# Patient Record
Sex: Female | Born: 1985 | Race: White | Hispanic: No | Smoking: Former smoker
Health system: Southern US, Community
[De-identification: ages and names within clinical notes are randomized; demographics above are authoritative.]

## PROBLEM LIST (undated history)

## (undated) DIAGNOSIS — T7840XA Allergy, unspecified, initial encounter: Secondary | ICD-10-CM

## (undated) DIAGNOSIS — G4730 Sleep apnea, unspecified: Secondary | ICD-10-CM

## (undated) DIAGNOSIS — F32A Depression, unspecified: Secondary | ICD-10-CM

## (undated) DIAGNOSIS — K219 Gastro-esophageal reflux disease without esophagitis: Secondary | ICD-10-CM

## (undated) DIAGNOSIS — J45909 Unspecified asthma, uncomplicated: Secondary | ICD-10-CM

## (undated) DIAGNOSIS — F909 Attention-deficit hyperactivity disorder, unspecified type: Secondary | ICD-10-CM

## (undated) DIAGNOSIS — T8859XA Other complications of anesthesia, initial encounter: Secondary | ICD-10-CM

## (undated) DIAGNOSIS — F419 Anxiety disorder, unspecified: Secondary | ICD-10-CM

## (undated) DIAGNOSIS — R0789 Other chest pain: Secondary | ICD-10-CM

## (undated) DIAGNOSIS — M1990 Unspecified osteoarthritis, unspecified site: Secondary | ICD-10-CM

## (undated) DIAGNOSIS — R011 Cardiac murmur, unspecified: Secondary | ICD-10-CM

## (undated) DIAGNOSIS — R0681 Apnea, not elsewhere classified: Secondary | ICD-10-CM

## (undated) HISTORY — DX: Cardiac murmur, unspecified: R01.1

## (undated) HISTORY — PX: TONSILLECTOMY: SUR1361

## (undated) HISTORY — DX: Unspecified osteoarthritis, unspecified site: M19.90

## (undated) HISTORY — DX: Anxiety disorder, unspecified: F41.9

## (undated) HISTORY — PX: CHOLECYSTECTOMY: SHX55

## (undated) HISTORY — PX: CERVICAL CONE BIOPSY: SUR198

## (undated) HISTORY — DX: Depression, unspecified: F32.A

## (undated) HISTORY — PX: WISDOM TOOTH EXTRACTION: SHX21

## (undated) HISTORY — DX: Sleep apnea, unspecified: G47.30

## (undated) HISTORY — DX: Unspecified asthma, uncomplicated: J45.909

## (undated) HISTORY — DX: Allergy, unspecified, initial encounter: T78.40XA

## (undated) HISTORY — PX: TUBAL LIGATION: SHX77

## (undated) HISTORY — PX: ENDOMETRIAL BIOPSY: SHX622

---

## 2020-01-21 DIAGNOSIS — Z419 Encounter for procedure for purposes other than remedying health state, unspecified: Secondary | ICD-10-CM | POA: Diagnosis not present

## 2020-02-21 ENCOUNTER — Other Ambulatory Visit: Payer: Self-pay

## 2020-02-21 ENCOUNTER — Ambulatory Visit
Admission: EM | Admit: 2020-02-21 | Discharge: 2020-02-21 | Disposition: A | Discharge status: Home / Self Care | Payer: Medicaid Other | Attending: Family Medicine | Admitting: Family Medicine

## 2020-02-21 ENCOUNTER — Encounter: Payer: Self-pay | Admitting: Emergency Medicine

## 2020-02-21 DIAGNOSIS — Z1152 Encounter for screening for COVID-19: Secondary | ICD-10-CM | POA: Diagnosis not present

## 2020-02-21 DIAGNOSIS — J029 Acute pharyngitis, unspecified: Secondary | ICD-10-CM | POA: Insufficient documentation

## 2020-02-21 DIAGNOSIS — B349 Viral infection, unspecified: Secondary | ICD-10-CM | POA: Diagnosis not present

## 2020-02-21 DIAGNOSIS — R591 Generalized enlarged lymph nodes: Secondary | ICD-10-CM | POA: Diagnosis not present

## 2020-02-21 DIAGNOSIS — Z419 Encounter for procedure for purposes other than remedying health state, unspecified: Secondary | ICD-10-CM | POA: Diagnosis not present

## 2020-02-21 HISTORY — DX: Apnea, not elsewhere classified: R06.81

## 2020-02-21 NOTE — Discharge Instructions (Signed)
Your COVID test is pending.  You should self quarantine until the test result is back.    Take Tylenol as needed for fever or discomfort.  Rest and keep yourself hydrated.    Go to the emergency department if you develop acute worsening symptoms.     

## 2020-02-21 NOTE — ED Provider Notes (Signed)
[facility]   [id number] 02/21/20 Arrival Time: 0850   CC: COVID symptoms  SUBJECTIVE: History from: patient.  Deborah Andrews is a 34 y.o. female who presents with abrupt onset of sore throat and lymphadenopathy since yesterday. Denies sick exposure to COVID, flu or strep. Denies recent travel. Has not taken OTC medications for this. There are no aggravating or alleviating factors. Denies previous symptoms in the past. Denies fever, chills, fatigue, sinus pain, rhinorrhea, SOB, wheezing, chest pain, nausea, changes in bowel or bladder habits.    ROS: As per HPI.  All other pertinent ROS negative.     Past Medical History:  Diagnosis Date  . Apnea    Past Surgical History:  Procedure Laterality Date  . CERVICAL CONE BIOPSY    . CESAREAN SECTION    . CHOLECYSTECTOMY    . TONSILLECTOMY    . TUBAL LIGATION    . WISDOM TOOTH EXTRACTION     No Known Allergies No current facility-administered medications on file prior to encounter.   No current outpatient medications on file prior to encounter.   Social History   Socioeconomic History  . Marital status: Divorced    Spouse name: Not on file  . Number of children: Not on file  . Years of education: Not on file  . Highest education level: Not on file  Occupational History  . Not on file  Tobacco Use  . Smoking status: Current Every Day Smoker    Packs/day: 1.00    Types: Cigarettes  . Smokeless tobacco: Never Used  Substance and Sexual Activity  . Alcohol use: Never  . Drug use: Never  . Sexual activity: Not on file  Other Topics Concern  . Not on file  Social History Narrative  . Not on file   Social Determinants of Health   Financial Resource Strain:   . Difficulty of Paying Living Expenses:   Food Insecurity:   . Worried About Programme researcher, broadcasting/film/video in the Last Year:   . Barista in the Last Year:   Transportation Needs:   . Freight forwarder (Medical):   Marland Kitchen Lack of Transportation  (Non-Medical):   Physical Activity:   . Days of Exercise per Week:   . Minutes of Exercise per Session:   Stress:   . Feeling of Stress :   Social Connections:   . Frequency of Communication with Friends and Family:   . Frequency of Social Gatherings with Friends and Family:   . Attends Religious Services:   . Active Member of Clubs or Organizations:   . Attends Banker Meetings:   Marland Kitchen Marital Status:   Intimate Partner Violence:   . Fear of Current or Ex-Partner:   . Emotionally Abused:   Marland Kitchen Physically Abused:   . Sexually Abused:    Family History  Problem Relation Age of Onset  . Diabetes Mother   . Hypertension Mother   . Cancer Mother   . Diabetes Father     OBJECTIVE:  Vitals:   02/21/20 0906 02/21/20 0910  BP:  115/78  Pulse:  (!) 106  Resp:  19  Temp:  99.2 F (37.3 C)  TempSrc:  Oral  SpO2:  96%  Weight: (!) 300 lb (136.1 kg)   Height: 5\' 3"  (1.6 m)      General appearance: alert; appears fatigued, but nontoxic; speaking in full sentences and tolerating own secretions HEENT: NCAT; Ears: EACs clear, TMs pearly gray; Eyes: PERRL.  EOM  grossly intact. Sinuses: nontender; Nose: nares patent without rhinorrhea, Throat: oropharynx eryhtematous, tonsils not present, uvula midline  Neck: supple with LAD Lungs: unlabored respirations, symmetrical air entry; cough: absent; no respiratory distress; CTAB Heart: regular rate and rhythm.  Radial pulses 2+ symmetrical bilaterally Skin: warm and dry Psychological: alert and cooperative; normal mood and affect  LABS:  No results found for this or any previous visit (from the past 24 hour(s)).   ASSESSMENT & PLAN:  1. Viral illness   2. Sore throat   3. Lymphadenopathy   4. Encounter for screening for COVID-19     Your rapid strep test is negative.   A throat culture is pending we will call you if it is positive requiring treatment.     COVID testing ordered.  It will take between 1-2 days for test  results.  Someone will contact you regarding abnormal results.    Patient should remain in quarantine until they have received Covid results.  If negative you may resume normal activities (go back to work/school) while practicing hand hygiene, social distance, and mask wearing.  If positive, patient should remain in quarantine for 10 days from symptom onset AND greater than 72 hours after symptoms resolution (absence of fever without the use of fever-reducing medication and improvement in respiratory symptoms), whichever is longer Get plenty of rest and push fluids Use OTC zyrtec for nasal congestion, runny nose, and/or sore throat Use OTC flonase for nasal congestion and runny nose Use medications daily for symptom relief Use OTC medications like ibuprofen or tylenol as needed fever or pain Call or go to the ED if you have any new or worsening symptoms such as fever, worsening cough, shortness of breath, chest tightness, chest pain, turning blue, changes in mental status.  Reviewed expectations re: course of current medical issues. Questions answered. Outlined signs and symptoms indicating need for more acute intervention. Patient verbalized understanding. After Visit Summary given.         Moshe Cipro, NP 02/21/20 1039

## 2020-02-21 NOTE — ED Triage Notes (Signed)
Sore throat and swollen lymph nodes since last night

## 2020-02-22 LAB — CULTURE, GROUP A STREP (THRC)

## 2020-02-23 LAB — NOVEL CORONAVIRUS, NAA: SARS-CoV-2, NAA: NOT DETECTED

## 2020-02-23 LAB — SARS-COV-2, NAA 2 DAY TAT

## 2020-03-21 ENCOUNTER — Other Ambulatory Visit: Payer: Self-pay

## 2020-03-21 ENCOUNTER — Ambulatory Visit
Admission: EM | Admit: 2020-03-21 | Discharge: 2020-03-21 | Disposition: A | Discharge status: Home / Self Care | Payer: Medicaid Other | Attending: Emergency Medicine | Admitting: Emergency Medicine

## 2020-03-21 ENCOUNTER — Encounter: Payer: Self-pay | Admitting: Emergency Medicine

## 2020-03-21 DIAGNOSIS — L299 Pruritus, unspecified: Secondary | ICD-10-CM

## 2020-03-21 DIAGNOSIS — R21 Rash and other nonspecific skin eruption: Secondary | ICD-10-CM | POA: Diagnosis not present

## 2020-03-21 MED ORDER — PREDNISONE 20 MG PO TABS: 20.0000 mg | ORAL_TABLET | Freq: Two times a day (BID) | ORAL | 0 refills | Status: AC

## 2020-03-21 NOTE — Discharge Instructions (Signed)
Wash with warm water and mild soap Keep covered Prednisone prescribed for pain and inflammation Dermatology referral ordered.   Return or go to the ER if you have any new or worsening symptoms such as fever, chills, nausea, vomiting, redness, swelling, discharge, if symptoms do not improve with medications, etc..Marland Kitchen

## 2020-03-21 NOTE — ED Triage Notes (Signed)
Rash to RT fingers that is starting to move up her rt arm

## 2020-03-21 NOTE — ED Provider Notes (Signed)
[facility]   [id number] 03/21/20 Arrival Time: 0853  CC: Rash  SUBJECTIVE:  Phyliss Hulick is a 34 y.o. female who presents with a acute on chronic rash to RT hand x 6 months, recent flare started 2 days ago.  Denies precipitating event or trauma.  States symptoms began a week or two before her menstrual cycle.  Localizes the rash to RT middle finger today.  Describes it as burning.  Has tried popping blisters, cleaning, and applying antibacterial ointment with minimal relief.  Symptoms are made worse to the touch.  Denies fever, chills, nausea, vomiting, SOB, chest pain.  ROS: As per HPI.  All other pertinent ROS negative.     Past Medical History:  Diagnosis Date  . Apnea    Past Surgical History:  Procedure Laterality Date  . CERVICAL CONE BIOPSY    . CESAREAN SECTION    . CHOLECYSTECTOMY    . TONSILLECTOMY    . TUBAL LIGATION    . WISDOM TOOTH EXTRACTION     No Known Allergies No current facility-administered medications on file prior to encounter.   No current outpatient medications on file prior to encounter.   Social History   Socioeconomic History  . Marital status: Divorced    Spouse name: Not on file  . Number of children: Not on file  . Years of education: Not on file  . Highest education level: Not on file  Occupational History  . Not on file  Tobacco Use  . Smoking status: Current Every Day Smoker    Packs/day: 1.00    Types: Cigarettes  . Smokeless tobacco: Never Used  Substance and Sexual Activity  . Alcohol use: Never  . Drug use: Never  . Sexual activity: Not on file  Other Topics Concern  . Not on file  Social History Narrative  . Not on file   Social Determinants of Health   Financial Resource Strain:   . Difficulty of Paying Living Expenses: Not on file  Food Insecurity:   . Worried About Programme researcher, broadcasting/film/video in the Last Year: Not on file  . Ran Out of Food in the Last Year: Not on file  Transportation Needs:   . Lack of  Transportation (Medical): Not on file  . Lack of Transportation (Non-Medical): Not on file  Physical Activity:   . Days of Exercise per Week: Not on file  . Minutes of Exercise per Session: Not on file  Stress:   . Feeling of Stress : Not on file  Social Connections:   . Frequency of Communication with Friends and Family: Not on file  . Frequency of Social Gatherings with Friends and Family: Not on file  . Attends Religious Services: Not on file  . Active Member of Clubs or Organizations: Not on file  . Attends Banker Meetings: Not on file  . Marital Status: Not on file  Intimate Partner Violence:   . Fear of Current or Ex-Partner: Not on file  . Emotionally Abused: Not on file  . Physically Abused: Not on file  . Sexually Abused: Not on file   Family History  Problem Relation Age of Onset  . Diabetes Mother   . Hypertension Mother   . Cancer Mother   . Diabetes Father     OBJECTIVE: Vitals:   03/21/20 0913 03/21/20 0924  BP:  121/83  Pulse:  92  Resp:  17  Temp:  98.4 F (36.9 C)  TempSrc:  Oral  SpO2:  96%  Weight: 300 lb (136.1 kg)   Height: 5\' 3"  (1.6 m)     General appearance: alert; no distress Head: NCAT Lungs: normal respiratory effort CV: Radial pulse 2+  Extremities: no edema Skin: warm and dry; 3-4 shallow blisters to distal lateral aspect of middle finger, mildly TTP, no obvious drainage or bleeding, some surrounding erythema Psychological: alert and cooperative; normal mood and affect  ASSESSMENT & PLAN:  1. Rash of finger   2. Itching     Meds ordered this encounter  Medications  . predniSONE (DELTASONE) 20 MG tablet    Sig: Take 1 tablet (20 mg total) by mouth 2 (two) times daily with a meal for 5 days.    Dispense:  10 tablet    Refill:  0    Order Specific Question:   Supervising Provider    Answer:   Eustace Moore    Wash with warm water and mild soap Keep covered Prednisone prescribed for pain and  inflammation Dermatology referral ordered.   Return or go to the ER if you have any new or worsening symptoms such as fever, chills, nausea, vomiting, redness, swelling, discharge, if symptoms do not improve with medications, etc...  Reviewed expectations re: course of current medical issues. Questions answered. Outlined signs and symptoms indicating need for more acute intervention. Patient verbalized understanding. After Visit Summary given.   [[id number]] [place], PA-C 03/21/20 [phone]

## 2020-04-28 ENCOUNTER — Ambulatory Visit: Payer: Medicaid Other

## 2020-05-04 ENCOUNTER — Ambulatory Visit
Admission: EM | Admit: 2020-05-04 | Discharge: 2020-05-04 | Disposition: A | Discharge status: Home / Self Care | Payer: Medicaid Other | Attending: Emergency Medicine | Admitting: Emergency Medicine

## 2020-05-04 ENCOUNTER — Encounter: Payer: Self-pay | Admitting: Emergency Medicine

## 2020-05-04 ENCOUNTER — Other Ambulatory Visit: Payer: Self-pay

## 2020-05-04 DIAGNOSIS — Z1152 Encounter for screening for COVID-19: Secondary | ICD-10-CM

## 2020-05-04 DIAGNOSIS — J0100 Acute maxillary sinusitis, unspecified: Secondary | ICD-10-CM

## 2020-05-04 DIAGNOSIS — H6593 Unspecified nonsuppurative otitis media, bilateral: Secondary | ICD-10-CM | POA: Diagnosis not present

## 2020-05-04 MED ORDER — DEXAMETHASONE 4 MG PO TABS: 4.0000 mg | ORAL_TABLET | Freq: Every day | ORAL | 0 refills | Status: AC

## 2020-05-04 MED ORDER — FLUTICASONE PROPIONATE 50 MCG/ACT NA SUSP
1.0000 | Freq: Every day | NASAL | 0 refills | Status: DC
Start: 2020-05-04 — End: 2020-06-07

## 2020-05-04 MED ORDER — AMOXICILLIN-POT CLAVULANATE 875-125 MG PO TABS
1.0000 | ORAL_TABLET | Freq: Two times a day (BID) | ORAL | 0 refills | Status: DC
Start: 2020-05-04 — End: 2020-06-07

## 2020-05-04 NOTE — ED Provider Notes (Signed)
[facility]   [id number] 05/04/20 Arrival Time: 1225   Chief Complaint  Patient presents with  . Nasal Congestion     SUBJECTIVE: History from: patient.  Deborah Andrews is a 34 y.o. female who presented to the urgent care for complaint of nasal congestion, postnasal drip, sinus pressure, sinus pain and loss of smell for the past 4 days.  Reports she is coughing green nasal discharge .  Denies sick exposure to COVID, flu or strep.  Denies recent travel.  Has tried OTC medication without relief.  Denies aggravating factors.  Denies previous symptoms in the past.   Denies fever, chills, fatigue,  rhinorrhea, sore throat, SOB, wheezing, chest pain, nausea, changes in bowel or bladder habits.    ROS: As per HPI.  All other pertinent ROS negative.     Past Medical History:  Diagnosis Date  . Apnea    Past Surgical History:  Procedure Laterality Date  . CERVICAL CONE BIOPSY    . CESAREAN SECTION    . CHOLECYSTECTOMY    . TONSILLECTOMY    . TUBAL LIGATION    . WISDOM TOOTH EXTRACTION     No Known Allergies No current facility-administered medications on file prior to encounter.   No current outpatient medications on file prior to encounter.   Social History   Socioeconomic History  . Marital status: Divorced    Spouse name: Not on file  . Number of children: Not on file  . Years of education: Not on file  . Highest education level: Not on file  Occupational History  . Not on file  Tobacco Use  . Smoking status: Current Every Day Smoker    Packs/day: 1.00    Types: Cigarettes  . Smokeless tobacco: Never Used  Substance and Sexual Activity  . Alcohol use: Never  . Drug use: Never  . Sexual activity: Not on file  Other Topics Concern  . Not on file  Social History Narrative  . Not on file   Social Determinants of Health   Financial Resource Strain:   . Difficulty of Paying Living Expenses: Not on file  Food Insecurity:   . Worried About Patent examiner in the Last Year: Not on file  . Ran Out of Food in the Last Year: Not on file  Transportation Needs:   . Lack of Transportation (Medical): Not on file  . Lack of Transportation (Non-Medical): Not on file  Physical Activity:   . Days of Exercise per Week: Not on file  . Minutes of Exercise per Session: Not on file  Stress:   . Feeling of Stress : Not on file  Social Connections:   . Frequency of Communication with Friends and Family: Not on file  . Frequency of Social Gatherings with Friends and Family: Not on file  . Attends Religious Services: Not on file  . Active Member of Clubs or Organizations: Not on file  . Attends Banker Meetings: Not on file  . Marital Status: Not on file  Intimate Partner Violence:   . Fear of Current or Ex-Partner: Not on file  . Emotionally Abused: Not on file  . Physically Abused: Not on file  . Sexually Abused: Not on file   Family History  Problem Relation Age of Onset  . Diabetes Mother   . Hypertension Mother   . Cancer Mother   . Diabetes Father     OBJECTIVE:  Vitals:   05/04/20 1243 05/04/20 1244  BP: 113/85   Pulse: 93   Resp: 18   Temp: 98.3 F (36.8 C)   TempSrc: Oral   SpO2: 96%   Weight:  300 lb (136.1 kg)  Height:  5\' 3"  (1.6 m)     Physical Exam Vitals and nursing note reviewed.  Constitutional:      General: She is not in acute distress.    Appearance: Normal appearance. She is normal weight. She is not ill-appearing, toxic-appearing or diaphoretic.  HENT:     Head: Normocephalic.     Right Ear: Ear canal normal. A middle ear effusion is present.     Left Ear: Ear canal and external ear normal. A middle ear effusion is present.     Nose:     Right Sinus: Maxillary sinus tenderness present.     Left Sinus: Maxillary sinus tenderness present.     Mouth/Throat:     Mouth: Mucous membranes are moist.     Pharynx: Oropharynx is clear. No oropharyngeal exudate.  Cardiovascular:     Rate and  Rhythm: Normal rate and regular rhythm.     Pulses: Normal pulses.     Heart sounds: Normal heart sounds. No murmur heard.  No friction rub. No gallop.   Pulmonary:     Effort: Pulmonary effort is normal. No respiratory distress.     Breath sounds: Normal breath sounds. No stridor. No wheezing, rhonchi or rales.  Chest:     Chest wall: No tenderness.  Neurological:     Mental Status: She is alert and oriented to person, place, and time.     LABS:  No results found for this or any previous visit (from the past 24 hour(s)).   ASSESSMENT & PLAN:  1. Encounter for screening for COVID-19   2. Acute non-recurrent maxillary sinusitis   3. Fluid level behind tympanic membrane of both ears     Meds ordered this encounter  Medications  . amoxicillin-clavulanate (AUGMENTIN) 875-125 MG tablet    Sig: Take 1 tablet by mouth every 12 (twelve) hours.    Dispense:  14 tablet    Refill:  0  . fluticasone (FLONASE) 50 MCG/ACT nasal spray    Sig: Place 1 spray into both nostrils daily for 14 days.    Dispense:  16 g    Refill:  0  . dexamethasone (DECADRON) 4 MG tablet    Sig: Take 1 tablet (4 mg total) by mouth daily for 7 days.    Dispense:  7 tablet    Refill:  0    Discharge instructions  COVID testing ordered.  It will take between 2-7 days for test results.  Someone will contact you regarding abnormal results.    In the meantime: You should remain isolated in your home for 10 days from symptom onset AND greater than 24 hours after symptoms resolution (absence of fever without the use of fever-reducing medication and improvement in respiratory symptoms), whichever is longer Get plenty of rest and push fluids Fonase for nasal congestion and runny nose Decadron was prescribed Augmentin was prescribed for acute sinusitis Use medications daily for symptom relief Use OTC medications like ibuprofen or tylenol as needed fever or pain Call or go to the ED if you have any new or  worsening symptoms such as fever, worsening cough, shortness of breath, chest tightness, chest pain, turning blue, changes in mental status, etc...   Reviewed expectations re: course of current medical issues. Questions answered. Outlined signs and symptoms indicating need  for more acute intervention. Patient verbalized understanding. After Visit Summary given.         Durward Parcel, FNP 05/04/20 1307

## 2020-05-04 NOTE — ED Triage Notes (Signed)
Pt reports nasal drainage and loss of smell that started 3 days ago.

## 2020-05-04 NOTE — Discharge Instructions (Addendum)
COVID testing ordered.  It will take between 2-7 days for test results.  Someone will contact you regarding abnormal results.    In the meantime: You should remain isolated in your home for 10 days from symptom onset AND greater than 24 hours after symptoms resolution (absence of fever without the use of fever-reducing medication and improvement in respiratory symptoms), whichever is longer Get plenty of rest and push fluids Fonase for nasal congestion and runny nose Decadron was prescribed Augmentin was prescribed for acute sinusitis Use medications daily for symptom relief Use OTC medications like ibuprofen or tylenol as needed fever or pain Call or go to the ED if you have any new or worsening symptoms such as fever, worsening cough, shortness of breath, chest tightness, chest pain, turning blue, changes in mental status, etc..Marland Kitchen

## 2020-05-05 LAB — SARS-COV-2, NAA 2 DAY TAT

## 2020-05-05 LAB — NOVEL CORONAVIRUS, NAA: SARS-CoV-2, NAA: DETECTED — AB

## 2020-05-06 ENCOUNTER — Telehealth: Payer: Self-pay | Admitting: Family

## 2020-05-06 NOTE — Telephone Encounter (Signed)
Called to Discuss with patient about Covid symptoms and the use of the monoclonal antibody infusion for those with mild to moderate Covid symptoms and at a high risk of hospitalization.     Pt appears to qualify for this infusion due to co-morbid conditions and/or a member of an at-risk group in accordance with the FDA Emergency Use Authorization.    Deborah Andrews was seen at the [facility] on 10/13 with nasal congestion, sinus pressure, sinus pain and loss of smell. Symptoms started on 10/9. Qualifying risk factors include BMI >25 (53) and increased social vulnerability risk score.   Spoke with Deborah Andrews and believes the test to be false and is in the process of getting retested. If positive she says she will quarantine as needed and is not interested in treatment. Hotline number provided should she change her mind.   Marcos Eke, NP 05/06/2020 10:48 AM

## 2020-05-07 ENCOUNTER — Other Ambulatory Visit: Payer: Self-pay

## 2020-05-07 ENCOUNTER — Emergency Department (HOSPITAL_COMMUNITY): Payer: Medicaid Other

## 2020-05-07 ENCOUNTER — Emergency Department (HOSPITAL_COMMUNITY)
Admission: EM | Admit: 2020-05-07 | Discharge: 2020-05-08 | Disposition: A | Discharge status: Home / Self Care | Payer: Medicaid Other | Attending: Emergency Medicine | Admitting: Emergency Medicine

## 2020-05-07 ENCOUNTER — Encounter (HOSPITAL_COMMUNITY): Payer: Self-pay | Admitting: Emergency Medicine

## 2020-05-07 DIAGNOSIS — U071 COVID-19: Secondary | ICD-10-CM | POA: Diagnosis not present

## 2020-05-07 DIAGNOSIS — Z5321 Procedure and treatment not carried out due to patient leaving prior to being seen by health care provider: Secondary | ICD-10-CM | POA: Insufficient documentation

## 2020-05-07 DIAGNOSIS — R002 Palpitations: Secondary | ICD-10-CM | POA: Diagnosis not present

## 2020-05-07 DIAGNOSIS — R0789 Other chest pain: Secondary | ICD-10-CM | POA: Diagnosis present

## 2020-05-07 LAB — COMPREHENSIVE METABOLIC PANEL
ALT: 15 U/L (ref 0–44)
AST: 19 U/L (ref 15–41)
Albumin: 3.6 g/dL (ref 3.5–5.0)
Alkaline Phosphatase: 49 U/L (ref 38–126)
Anion gap: 8 (ref 5–15)
BUN: 5 mg/dL — ABNORMAL LOW (ref 6–20)
CO2: 23 mmol/L (ref 22–32)
Calcium: 9.2 mg/dL (ref 8.9–10.3)
Chloride: 105 mmol/L (ref 98–111)
Creatinine, Ser: 0.77 mg/dL (ref 0.44–1.00)
GFR, Estimated: >60 mL/min (ref >60)
Glucose, Bld: 102 mg/dL — ABNORMAL HIGH (ref 70–99)
Potassium: 3.8 mmol/L (ref 3.5–5.1)
Sodium: 136 mmol/L (ref 135–145)
Total Bilirubin: 0.6 mg/dL (ref 0.3–1.2)
Total Protein: 7.8 g/dL (ref 6.5–8.1)

## 2020-05-07 LAB — CBC WITH DIFFERENTIAL/PLATELET
Abs Immature Granulocytes: 0.02 10*3/uL (ref 0.00–0.07)
Basophils Absolute: 0 10*3/uL (ref 0.0–0.1)
Basophils Relative: 0 %
Eosinophils Absolute: 0 10*3/uL (ref 0.0–0.5)
Eosinophils Relative: 0 %
HCT: 45 % (ref 36.0–46.0)
Hemoglobin: 14.9 g/dL (ref 12.0–15.0)
Immature Granulocytes: 0 %
Lymphocytes Relative: 32 %
Lymphs Abs: 3.2 10*3/uL (ref 0.7–4.0)
MCH: 30.1 pg (ref 26.0–34.0)
MCHC: 33.1 g/dL (ref 30.0–36.0)
MCV: 90.9 fL (ref 80.0–100.0)
Monocytes Absolute: 0.4 10*3/uL (ref 0.1–1.0)
Monocytes Relative: 4 %
Neutro Abs: 6.2 10*3/uL (ref 1.7–7.7)
Neutrophils Relative %: 64 %
Platelets: 302 10*3/uL (ref 150–400)
RBC: 4.95 MIL/uL (ref 3.87–5.11)
RDW: 13.1 % (ref 11.5–15.5)
WBC: 9.8 10*3/uL (ref 4.0–10.5)
nRBC: 0 % (ref 0.0–0.2)

## 2020-05-07 LAB — TROPONIN I (HIGH SENSITIVITY): Troponin I (High Sensitivity): <2 ng/L (ref <18)

## 2020-05-07 NOTE — ED Triage Notes (Signed)
Patient tested positive for COVID19 Oct. 13,2021 , reports chest discomfort with intermittent palpitations , denies SOB , no cough or fever.

## 2020-05-08 LAB — TROPONIN I (HIGH SENSITIVITY): Troponin I (High Sensitivity): 3 ng/L (ref <18)

## 2020-05-08 NOTE — ED Notes (Signed)
Pt is no longer in COVID 19 precautions box. Pt did not indicate she was leaving, but her and her children are gone.

## 2020-06-06 ENCOUNTER — Ambulatory Visit: Payer: Self-pay | Admitting: Family Medicine

## 2020-06-07 ENCOUNTER — Other Ambulatory Visit: Payer: Self-pay

## 2020-06-07 ENCOUNTER — Ambulatory Visit (INDEPENDENT_AMBULATORY_CARE_PROVIDER_SITE_OTHER): Payer: Medicaid Other | Admitting: Family Medicine

## 2020-06-07 ENCOUNTER — Encounter: Payer: Self-pay | Admitting: Family Medicine

## 2020-06-07 VITALS — BP 116/80 | HR 89 | Temp 98.5°F | Ht 63.5 in | Wt 293.0 lb

## 2020-06-07 DIAGNOSIS — F3342 Major depressive disorder, recurrent, in full remission: Secondary | ICD-10-CM

## 2020-06-07 DIAGNOSIS — J4520 Mild intermittent asthma, uncomplicated: Secondary | ICD-10-CM | POA: Diagnosis not present

## 2020-06-07 DIAGNOSIS — F17210 Nicotine dependence, cigarettes, uncomplicated: Secondary | ICD-10-CM | POA: Insufficient documentation

## 2020-06-07 DIAGNOSIS — Z87891 Personal history of nicotine dependence: Secondary | ICD-10-CM | POA: Insufficient documentation

## 2020-06-07 DIAGNOSIS — F410 Panic disorder [episodic paroxysmal anxiety] without agoraphobia: Secondary | ICD-10-CM

## 2020-06-07 DIAGNOSIS — Z803 Family history of malignant neoplasm of breast: Secondary | ICD-10-CM

## 2020-06-07 MED ORDER — ALBUTEROL SULFATE HFA 108 (90 BASE) MCG/ACT IN AERS
2.0000 | INHALATION_SPRAY | RESPIRATORY_TRACT | 2 refills | Status: DC | PRN
Start: 2020-06-07 — End: 2021-05-11

## 2020-06-07 NOTE — Progress Notes (Signed)
Subjective  CC:  Chief Complaint  Patient presents with  . New Patient (Initial Visit)    blood work also, not fasting (had coffee)   . Referral    obgyn and mammogram    HPI: Deborah Andrews is a 34 y.o. female who presents to [facility] today to establish care with me as a new patient.  No old records available for review She has the following concerns or needs:  34 year old female who recently moved here from [place].  She is living with her fianc, they have a blended family.  She is doing well.  She reports her past medical history is significant for depression anxiety although she now is feeling well.  She is no longer taking any medications.  She reports she has been on many medications in the past for depression.  She has history of panic attacks but denies bipolar disorder.  She also has history of allergies.  She is a chronic daily smoker.  She has cut back, was smoking 4 packs/day but is now down to 1 pack/day.  She started smoking at age 45.  She has history of asthma but reports it is very well controlled.  Has not had any flares or exacerbations in several years.  Does not currently have albuterol.  She has never been hospitalized.  She denies shortness of breath or wheezing.  She is contemplating trying to quit smoking completely.  She has used Chantix in the past this had negative side effects.  History of cold knife cone biopsy.  She reports she does not do a provider who told her she cannot cervical cancer but then another provider told her her Pap smear was normal.  Apparently there is a lawsuit against this provider.  She has had a tubal ligation.  She has a strong family history of breast cancer: She reports her mother, uncles, aunts all have it.  Currently they have never had genetic testing.  Health maintenance: She is overdue for a complete physical and Pap smear.  Assessment  1. Mild intermittent asthma without complication   2. Cigarette  nicotine dependence without complication   3. Morbid obesity (HCC)   4. Depression, major, recurrent, in complete remission (HCC)   5. Panic attack   6. Family history of breast cancer      Plan   Mild intermittent asthma: Albuterol inhaler provided.  Education given.  Discussed need to quit smoking.  Smoking cessation counseling done.  She is contemplative.  Continue to cut back we will continue working on quitting plan.  Morbid obesity: She is starting to lose weight.  Eating better and healthier now that she is living here.  Education given.  History of depression and panic attacks: Currently stable.  Will monitor.  High risk for recurrence.  Strong family history of breast cancer: Refer to genetics for risk calculation to assess for need of high risk surveillance plan.  Follow up: Schedule CPE Orders Placed This Encounter  Procedures  . Ambulatory referral to Genetics   Meds ordered this encounter  Medications  . albuterol (VENTOLIN HFA) 108 (90 Base) MCG/ACT inhaler    Sig: Inhale 2 puffs into the lungs every 4 (four) hours as needed for wheezing or shortness of breath.    Dispense:  1 each    Refill:  2     Depression screen PHQ 2/9 06/07/2020  Decreased Interest 0  Down, Depressed, Hopeless 0  PHQ - 2 Score 0  We updated and reviewed the patient's past history in detail and it is documented below.  Patient Active Problem List   Diagnosis Date Noted  . Mild intermittent asthma without complication 06/07/2020  . Cigarette nicotine dependence without complication 06/07/2020  . Morbid obesity (HCC) 06/07/2020  . Depression, major, recurrent, in complete remission (HCC) 06/07/2020   Health Maintenance  Topic Date Due  . Hepatitis C Screening  Never done  . HIV Screening  Never done  . PAP SMEAR-Modifier  01/20/2018  . COVID-19 Vaccine (1) 06/23/2020 (Originally 02/21/2000)  . INFLUENZA VACCINE  10/20/2020 (Originally 02/21/2020)  . TETANUS/TDAP  02/20/2026    Immunization History  Administered Date(s) Administered  . Tdap 02/21/2016   Current Meds  Medication Sig  . chlorhexidine (PERIDEX) 0.12 % solution SMARTSIG:By Mouth  . ibuprofen (ADVIL) 600 MG tablet Take 600 mg by mouth every 6 (six) hours as needed.    Allergies: Patient has No Known Allergies. Past Medical History Patient  has a past medical history of Allergy, Anxiety, Apnea, Arthritis, Asthma, Depression, and Heart murmur. Past Surgical History Patient  has a past surgical history that includes Cesarean section; Cholecystectomy; Tonsillectomy; Wisdom tooth extraction; Tubal ligation; and Cervical cone biopsy. Family History: Patient family history includes Cancer in her mother; Diabetes in her father and mother; Hypertension in her mother. Social History:  Patient  reports that she has been smoking cigarettes. She has a 22.00 pack-year smoking history. She has never used smokeless tobacco. She reports that she does not drink alcohol and does not use drugs.  Review of Systems: Constitutional: negative for fever or malaise Ophthalmic: negative for photophobia, double vision or loss of vision Cardiovascular: negative for chest pain, dyspnea on exertion, or new LE swelling Respiratory: negative for SOB or persistent cough Gastrointestinal: negative for abdominal pain, change in bowel habits or melena Genitourinary: negative for dysuria or gross hematuria Musculoskeletal: negative for new gait disturbance or muscular weakness Integumentary: negative for new or persistent rashes Neurological: negative for TIA or stroke symptoms Psychiatric: negative for SI or delusions Allergic/Immunologic: negative for hives  Patient Care Team    Relationship Specialty Notifications Start End  Willow Ora, MD PCP - General Family Medicine  02/21/20     Objective  Vitals: BP 116/80   Pulse 89   Temp 98.5 F (36.9 C) (Temporal)   Ht 5' 3.5" (1.613 m)   Wt 293 lb (132.9 kg)   LMP  06/01/2020   SpO2 96%   BMI 51.09 kg/m  General:  Well developed, well nourished, no acute distress  Psych:  Alert and oriented,normal mood and affect HEENT:  Normocephalic, atraumatic, non-icteric sclera, supple neck without adenopathy, mass or thyromegaly Cardiovascular:  RRR without gallop, rub or murmur Respiratory:  Good breath sounds bilaterally, CTAB with normal respiratory effort Gastrointestinal: normal bowel sounds, soft, non-tender, no noted masses. No HSM MSK: no deformities, contusions. Joints are without erythema or swelling Skin:  Warm, no rashes or suspicious lesions noted Neurologic:    Mental status is normal. Gross motor and sensory exams are normal. Normal gait   Commons side effects, risks, benefits, and alternatives for medications and treatment plan prescribed today were discussed, and the patient expressed understanding of the given instructions. Patient is instructed to call or message via MyChart if he/she has any questions or concerns regarding our treatment plan. No barriers to understanding were identified. We discussed Red Flag symptoms and signs in detail. Patient expressed understanding regarding what to do in case of urgent  or emergency type symptoms.   Medication list was reconciled, printed and provided to the patient in AVS. Patient instructions and summary information was reviewed with the patient as documented in the AVS. This note was prepared with assistance of Dragon voice recognition software. Occasional wrong-word or sound-a-like substitutions may have occurred due to the inherent limitations of voice recognition software  This visit occurred during the SARS-CoV-2 public health emergency.  Safety protocols were in place, including screening questions prior to the visit, additional usage of staff PPE, and extensive cleaning of exam room while observing appropriate contact time as indicated for disinfecting solutions.

## 2020-06-07 NOTE — Patient Instructions (Signed)
Please return for your annual complete physical; please come fasting. We will do your pap smear at that time as well.  We will call you with information regarding your referral appointment. Genetics to help determine your risk for breast cancer and see if you qualify for genetic testing and/or high risk screening.  If you do not hear from [place] within the next 2 weeks, please let me know. It can take 1-2 weeks to get appointments set up with the specialists.   If you have any questions or concerns, please don't hesitate to send me a message via MyChart or call the office at [phone]. Thank you for visiting with [place] today! It's our pleasure caring for you.   Steps to Quit Smoking Smoking tobacco is the leading cause of preventable death. It can affect almost every organ in the body. Smoking puts you and those around you at risk for developing many serious chronic diseases. Quitting smoking can be difficult, but it is one of the best things that you can do for your health. It is never too late to quit. How do I get ready to quit? When you decide to quit smoking, create a plan to help you succeed. Before you quit:  Pick a date to quit. Set a date within the next 2 weeks to give you time to prepare.  Write down the reasons why you are quitting. Keep this list in places where you will see it often.  Tell your family, friends, and co-workers that you are quitting. Support from your loved ones can make quitting easier.  Talk with your health care provider about your options for quitting smoking.  Find out what treatment options are covered by your health insurance.  Identify people, places, things, and activities that make you want to smoke (triggers). Avoid them. What first steps can I take to quit smoking?  Throw away all cigarettes at home, at work, and in your car.  Throw away smoking accessories, such as Set designer.  Clean your car. Make sure to empty the ashtray.  Clean your  home, including curtains and carpets. What strategies can I use to quit smoking? Talk with your health care provider about combining strategies, such as taking medicines while you are also receiving in-person counseling. Using these two strategies together makes you more likely to succeed in quitting than if you used either strategy on its own.  If you are pregnant or breastfeeding, talk with your health care provider about finding counseling or other support strategies to quit smoking. Do not take medicine to help you quit smoking unless your health care provider tells you to do so. To quit smoking: Quit right away  Quit smoking completely, instead of gradually reducing how much you smoke over a period of time. Research shows that stopping smoking right away is more successful than gradually quitting.  Attend in-person counseling to help you build problem-solving skills. You are more likely to succeed in quitting if you attend counseling sessions regularly. Even short sessions of 10 minutes can be effective. Take medicine You may take medicines to help you quit smoking. Some medicines require a prescription and some you can purchase over-the-counter. Medicines may have nicotine in them to replace the nicotine in cigarettes. Medicines may:  Help to stop cravings.  Help to relieve withdrawal symptoms. Your health care provider may recommend:  Nicotine patches, gum, or lozenges.  Nicotine inhalers or sprays.  Non-nicotine medicine that is taken by mouth. Find resources Find resources and  support systems that can help you to quit smoking and remain smoke-free after you quit. These resources are most helpful when you use them often. They include:  Online chats with a Veterinary surgeon.  Telephone quitlines.  Printed Materials engineer.  Support groups or group counseling.  Text messaging programs.  Mobile phone apps or applications. Use apps that can help you stick to your quit plan by  providing reminders, tips, and encouragement. There are many free apps for mobile devices as well as websites. Examples include Quit Guide from the [employer] and smokefree.gov What things can I do to make it easier to quit?   Reach out to your family and friends for support and encouragement. Call telephone quitlines (1-800-QUIT-NOW), reach out to support groups, or work with a counselor for support.  Ask people who smoke to avoid smoking around you.  Avoid places that trigger you to smoke, such as bars, parties, or smoke-break areas at work.  Spend time with people who do not smoke.  Lessen the stress in your life. Stress can be a smoking trigger for some people. To lessen stress, try: ? Exercising regularly. ? Doing deep-breathing exercises. ? Doing yoga. ? Meditating. ? Performing a body scan. This involves closing your eyes, scanning your body from head to toe, and noticing which parts of your body are particularly tense. Try to relax the muscles in those areas. How will I feel when I quit smoking? Day 1 to 3 weeks Within the first 24 hours of quitting smoking, you may start to feel withdrawal symptoms. These symptoms are usually most noticeable 2-3 days after quitting, but they usually do not last for more than 2-3 weeks. You may experience these symptoms:  Mood swings.  Restlessness, anxiety, or irritability.  Trouble concentrating.  Dizziness.  Strong cravings for sugary foods and nicotine.  Mild weight gain.  Constipation.  Nausea.  Coughing or a sore throat.  Changes in how the medicines that you take for unrelated issues work in your body.  Depression.  Trouble sleeping (insomnia). Week 3 and afterward After the first 2-3 weeks of quitting, you may start to notice more positive results, such as:  Improved sense of smell and taste.  Decreased coughing and sore throat.  Slower heart rate.  Lower blood pressure.  Clearer skin.  The ability to breathe more  easily.  Fewer sick days. Quitting smoking can be very challenging. Do not get discouraged if you are not successful the first time. Some people need to make many attempts to quit before they achieve long-term success. Do your best to stick to your quit plan, and talk with your health care provider if you have any questions or concerns. Summary  Smoking tobacco is the leading cause of preventable death. Quitting smoking is one of the best things that you can do for your health.  When you decide to quit smoking, create a plan to help you succeed.  Quit smoking right away, not slowly over a period of time.  When you start quitting, seek help from your health care provider, family, or friends. This information is not intended to replace advice given to you by your health care provider. Make sure you discuss any questions you have with your health care provider. Document Revised: 04/03/2019 Document Reviewed: 09/27/2018 Elsevier Patient Education  2020 [employer].

## 2020-06-08 ENCOUNTER — Encounter: Payer: Self-pay | Admitting: Family Medicine

## 2020-06-08 ENCOUNTER — Telehealth: Payer: Self-pay | Admitting: Genetic Counselor

## 2020-06-08 NOTE — Telephone Encounter (Signed)
Received a genetic counseling referral from Dr. Mardelle Matte for fhx of breast cancer. Deborah Andrews has been cld and scheduled to see Cari on 12/6 at 11am. Pt aware to arrive 15 minutes early.

## 2020-06-22 ENCOUNTER — Telehealth: Payer: Self-pay

## 2020-06-22 NOTE — Telephone Encounter (Signed)
Pt states she was looking over her Mychart account and there are some errors. Pt states she HAS been hospitalized many times. She also states she does have heart problems. Pt states it has been " quitting" since she was a baby

## 2020-06-27 ENCOUNTER — Inpatient Hospital Stay: Payer: Medicaid Other

## 2020-06-27 ENCOUNTER — Other Ambulatory Visit: Payer: Self-pay

## 2020-06-27 ENCOUNTER — Encounter: Payer: Self-pay | Admitting: Genetic Counselor

## 2020-06-27 ENCOUNTER — Inpatient Hospital Stay: Payer: Medicaid Other | Attending: Genetic Counselor | Admitting: Genetic Counselor

## 2020-06-27 DIAGNOSIS — Z808 Family history of malignant neoplasm of other organs or systems: Secondary | ICD-10-CM | POA: Diagnosis not present

## 2020-06-27 DIAGNOSIS — Z803 Family history of malignant neoplasm of breast: Secondary | ICD-10-CM | POA: Diagnosis not present

## 2020-06-27 DIAGNOSIS — Z8041 Family history of malignant neoplasm of ovary: Secondary | ICD-10-CM

## 2020-06-27 NOTE — Progress Notes (Signed)
REFERRING PROVIDER: Leamon Arnt, [address] [place],  [place] [address]  PRIMARY PROVIDER:  Leamon Arnt, MD  PRIMARY REASON FOR VISIT:  1. Family history of breast cancer   2. Family history of ovarian cancer   3. Family history of skin cancer    HISTORY OF PRESENT ILLNESS:   Ms. Houser, a 34 y.o. female, was seen for a [facility] cancer genetics consultation at the request of Dr. Jonni Sanger due to a family history of cancer.  Ms. Plaisted presents to clinic today with her significant other, Gerald Stabs, to discuss the possibility of a hereditary predisposition to cancer, to discuss genetic testing, and to further clarify her future cancer risks, as well as potential cancer risks for family members.    Ms. Richwine is a 34 y.o. female with no personal history of cancer.    RISK FACTORS:  Menarche was at age 37.  First live birth at age 40.  OCP use for approximately 1-2 years.  Ovaries intact: yes.  Hysterectomy: no.  Menopausal status: premenopausal.  HRT use: 0 years. Colonoscopy: no; not examined. Mammogram within the last year: most recent mammogram approximately 1.5 years ago per patient. Number of breast biopsies: 0. Up to date with pelvic exams: most recent PAP in 2016 per patient. Any excessive radiation exposure in the past: no  Past Medical History:  Diagnosis Date  . Allergy   . Anxiety   . Apnea   . Arthritis   . Asthma   . Depression   . Heart murmur     Past Surgical History:  Procedure Laterality Date  . CERVICAL CONE BIOPSY    . CESAREAN SECTION    . CHOLECYSTECTOMY    . TONSILLECTOMY    . TUBAL LIGATION    . WISDOM TOOTH EXTRACTION      Social History   Socioeconomic History  . Marital status: Divorced    Spouse name: Amaryllis Dyke  . Number of children: Not on file  . Years of education: Not on file  . Highest education level: Not on file  Occupational History  . Not on file  Tobacco Use  . Smoking status: Current Every Day Smoker     Packs/day: 1.00    Years: 22.00    Pack years: 22.00    Types: Cigarettes  . Smokeless tobacco: Never Used  Substance and Sexual Activity  . Alcohol use: Never  . Drug use: Never  . Sexual activity: Yes    Birth control/protection: Surgical  Other Topics Concern  . Not on file  Social History Narrative  . Not on file   Social Determinants of Health   Financial Resource Strain:   . Difficulty of Paying Living Expenses: Not on file  Food Insecurity:   . Worried About Charity fundraiser in the Last Year: Not on file  . Ran Out of Food in the Last Year: Not on file  Transportation Needs:   . Lack of Transportation (Medical): Not on file  . Lack of Transportation (Non-Medical): Not on file  Physical Activity:   . Days of Exercise per Week: Not on file  . Minutes of Exercise per Session: Not on file  Stress:   . Feeling of Stress : Not on file  Social Connections:   . Frequency of Communication with Friends and Family: Not on file  . Frequency of Social Gatherings with Friends and Family: Not on file  . Attends Religious Services: Not on file  .  Active Member of Clubs or Organizations: Not on file  . Attends Archivist Meetings: Not on file  . Marital Status: Not on file     FAMILY HISTORY:  We obtained a detailed, 4-generation family history.  Significant diagnoses are listed below: Family History  Problem Relation Age of Onset  . Breast cancer Mother        dx before age 18  . Cancer Mother 37       Gyn. ?cervical ?ovarian   . Cancer - Other Mother 61       unknown primary. ?kidney  . Breast cancer Maternal Uncle 25       female breast cancer  . Cervical cancer Half-Sister 32       maternal half sister  . Leukemia Half-Brother 2       maternal half brother  . Skin cancer Maternal Aunt        x3  . Melanoma Maternal Grandmother   . Melanoma Maternal Grandfather   . Cancer Maternal Grandfather        unknown type    Ms. Chauncey has one son and one  daughter.  Ms. Mcculloh has several maternal and paternal half siblings.  Her maternal half sister, age 25, was diagnosed with cervical cancer at age 38.  Her maternal brother, age 13, was diagnosed with leukemia at age 64.   Ms. Fehnel reported that her mother was diagnosed with breast cancer at age 62, treated with surgery, chemotherapy, and radiation.  Ms. Pederson mother also has a history of gynecological cancer (possible cervical or ovarian) at age 20.  Ms. Hasley mother also has history of cancer at age 70 of unknown primary, possibly kidney.  Ms. Ammon maternal uncle has a history of female breast cancer at age 36.  Several of Ms. Furno's maternal aunts had skin cancer.  Both of Ms. Miggins's maternal grandmother and grandfather had a history of melanoma.  Ms. Bracher reports that her paternal grandfather's mother had brain cancer.  No other maternal family history of cancer was reported.   Ms. Walstad father passed away at age 59 and did not have cancer to her knowledge.  Ms. Uber has limited information about her paternal family history.   Ms. Sonoda is unaware of previous family history of genetic testing for hereditary cancer risks. Patient's ancestors are of Native [place], [place], [place], and [place] decent. There is no reported Ashkenazi Jewish ancestry. There is no known consanguinity between Ms. Belvin's parents.  GENETIC COUNSELING ASSESSMENT: Ms. Allerton is a 34 y.o. female with a family history of cancer which is somewhat suggestive of a hereditary cancer syndrome and predisposition to cancer given ages and related diagnoses in her maternal family members. We, therefore, discussed and recommended the following at today's visit.   DISCUSSION: We discussed that 5 - 10% of cancer is hereditary with most cases of hereditary breast cancer associated with mutations in BRCA1 or BRCA2.  There are other genes that can be associated with hereditary cancer syndromes.  Type of cancer risk and level of risk are  gene-specific.  We discussed that testing is beneficial for several reasons, including knowing about other cancer risks, identifying potential screening and risk-reduction options that may be appropriate, and to understanding if other family members could be at risk for cancer and allowing them to undergo genetic testing.  We reviewed the characteristics, features and inheritance patterns of hereditary cancer syndromes. We also discussed genetic testing, including the appropriate family members to test,  the process of testing, insurance coverage and turn-around-time for results. We discussed the implications of a negative, positive, carrier and/or variant of uncertain significant result. We discussed that negative results would be uninformative given that Ms. Engelbrecht does not have a personal history of cancer. We recommended Ms. Balinda Quails pursue genetic testing for a panel that contains genes associated with breast, gynecological, and other cancers.  Ms. Willets was offered a common hereditary cancer panel (47 genes) and an expanded pan-cancer panel (84 genes). Ms. Signore was informed of the benefits and limitations of each panel, including that expanded pan-cancer panels contain several preliminary evidence genes that do not have clear management guidelines at this point in time.  We also discussed that as the number of genes included on a panel increases, the chances of variants of uncertain significance increases.  After considering the benefits and limitations of each gene panel, Ms. Lao elected to have an expanded pan-cancer panel through [facility].  The Multi-Cancer + RNA Panel offered by [facility] includes sequencing and/or deletion/duplication analysis of the following 84 genes:  AIP*, ALK, APC*, ATM*, AXIN2*, BAP1*, BARD1*, BLM*, BMPR1A*, BRCA1*, BRCA2*, BRIP1*, CASR, CDC73*, CDH1*, CDK4, CDKN1B*, CDKN1C*, CDKN2A, CEBPA, CHEK2*, CTNNA1*, DICER1*, DIS3L2*, EGFR, EPCAM, FH*, FLCN*, GATA2*, GPC3, GREM1, HOXB13,  HRAS, KIT, MAX*, MEN1*, MET, MITF, MLH1*, MSH2*, MSH3*, MSH6*, MUTYH*, NBN*, NF1*, NF2*, NTHL1*, PALB2*, PDGFRA, PHOX2B, PMS2*, POLD1*, POLE*, POT1*, PRKAR1A*, PTCH1*, PTEN*, RAD50*, RAD51C*, RAD51D*, RB1*, RECQL4, RET, RUNX1*, SDHA*, SDHAF2*, SDHB*, SDHC*, SDHD*, SMAD4*, SMARCA4*, SMARCB1*, SMARCE1*, STK11*, SUFU*, TERC, TERT, TMEM127*, Tp53*, TSC1*, TSC2*, VHL*, WRN*, and WT1.  RNA analysis is performed for * genes.   Based on Ms. Steedley's family history of cancer, she meets medical criteria for genetic testing. Despite that she meets criteria, she may still have an out of pocket cost.   We discussed that some people do not want to undergo genetic testing due to fear of genetic discrimination.  A federal law called the Genetic Information Non-Discrimination Act (GINA) of 2008 helps protect individuals against genetic discrimination based on their genetic test results.  It impacts both health insurance and employment.  With health insurance, it protects against increased premiums, being kicked off insurance or being forced to take a test in order to be insured.  For employment it protects against hiring, firing and promoting decisions based on genetic test results.  GINA does not apply to those in the [employer], those who work for companies with less than 15 employees, and new life insurance or long-term disability insurance policies.  Health status due to a cancer diagnosis is not protected under GINA.  PLAN: After considering the risks, benefits, and limitations, Ms. Scarfo provided informed consent to pursue genetic testing and the blood sample was sent to [facility] for analysis of the Multi-Cancer +RNA Panel. Results should be available within approximately 3 weeks' time, at which point they will be disclosed by telephone to Ms. Diamond, as will any additional recommendations warranted by these results. Ms. Mckneely will receive a summary of her genetic counseling visit and a copy of her results once  available. This information will also be available in Epic.   Based on Ms. Nordmann's family history, we recommended her mother have genetic counseling and testing. Ms. Alas will let us know if we can be of any assistance in coordinating genetic counseling and/or testing for this family member.   Lastly, we encouraged Ms. Latin to remain in contact with cancer genetics annually so that we can continuously update the family history  and inform her of any changes in cancer genetics and testing that may be of benefit for this family.   Ms. Blasdell questions were answered to her satisfaction today. Our contact information was provided should additional questions or concerns arise. Thank you for the referral and allowing [place] to share in the care of your patient.   Ralpheal Zappone M. Joette Catching, [place], [place] [email or web address] (P) [phone]   The patient was seen for a total of 40 minutes in face-to-face genetic counseling.  This patient was discussed with Drs. Magrinat, Lindi Adie and/or Burr Medico who agrees with the above.    _______________________________________________________________________ For Office Staff:  Number of people involved in session: 1 Was an Intern/ student involved with case: no

## 2020-06-28 ENCOUNTER — Other Ambulatory Visit: Payer: Self-pay

## 2020-07-06 NOTE — Telephone Encounter (Signed)
Discussed patient's concerns. Informed that the best option is an office visit with PCP to discuss exactly what her heart issues are. States that she can wait until her CPE in March.

## 2020-07-27 ENCOUNTER — Telehealth: Payer: Self-pay | Admitting: Genetic Counselor

## 2020-07-27 ENCOUNTER — Encounter: Payer: Self-pay | Admitting: Genetic Counselor

## 2020-07-27 DIAGNOSIS — Z1379 Encounter for other screening for genetic and chromosomal anomalies: Secondary | ICD-10-CM | POA: Insufficient documentation

## 2020-07-27 NOTE — Telephone Encounter (Signed)
Revealed negative genetic testing and variant of uncertain significance in POLE.  Discussed that we do not know why there is cancer in the family. It could be familial, due to a gene mutation she did not inherit, due to a different gene that we are not testing, or maybe our current technology may not be able to pick something up.  It will be important for her to keep in contact with genetics to keep up with whether additional testing may be needed.

## 2020-08-03 ENCOUNTER — Encounter: Payer: Self-pay | Admitting: Genetic Counselor

## 2020-08-03 ENCOUNTER — Ambulatory Visit: Payer: Self-pay | Admitting: Genetic Counselor

## 2020-08-03 DIAGNOSIS — Z803 Family history of malignant neoplasm of breast: Secondary | ICD-10-CM

## 2020-08-03 DIAGNOSIS — Z1379 Encounter for other screening for genetic and chromosomal anomalies: Secondary | ICD-10-CM

## 2020-08-03 DIAGNOSIS — Z8041 Family history of malignant neoplasm of ovary: Secondary | ICD-10-CM

## 2020-08-03 DIAGNOSIS — Z808 Family history of malignant neoplasm of other organs or systems: Secondary | ICD-10-CM

## 2020-08-03 NOTE — Progress Notes (Signed)
HPI:  Ms. Brummitt was previously seen in the [place] clinic due to a family history of cancer and concerns regarding a hereditary predisposition to cancer. Please refer to our prior cancer genetics clinic note for more information regarding our discussion, assessment and recommendations, at the time. Ms. Simmonds recent genetic test results were disclosed to her, as were recommendations warranted by these results. These results and recommendations are discussed in more detail below.  CANCER HISTORY:   Ms. Paone is a 35 y.o. female with no personal history of cancer.  FAMILY HISTORY:  We obtained a detailed, 4-generation family history.  Significant diagnoses are listed below: Family History  Problem Relation Age of Onset  . Breast cancer Mother        dx before age 9  . Cancer Mother 38       Gyn. ?cervical ?ovarian   . Cancer - Other Mother 46       unknown primary. ?kidney  . Diabetes Father   . Breast cancer Maternal Uncle 13       female breast cancer  . Cervical cancer Half-Sister 1       maternal half sister  . Leukemia Half-Brother 2       maternal half brother  . Skin cancer Maternal Aunt        x3  . Melanoma Maternal Grandmother   . Melanoma Maternal Grandfather   . Cancer Maternal Grandfather        unknown type    Ms. Zappulla has one son and one daughter.  Ms. Mclees has several maternal and paternal half siblings.  Her maternal half sister, age 50, was diagnosed with cervical cancer at age 30.  Her maternal brother, age 66, was diagnosed with leukemia at age 88.   Ms. Washinton reported that her mother was diagnosed with breast cancer at age 76, treated with surgery, chemotherapy, and radiation.  Ms. Zumstein mother also has a history of gynecological cancer (possible cervical or ovarian) at age 10.  Ms. Lokey mother also has history of cancer at age 65 of unknown primary, possibly kidney.  Ms. Bricco maternal uncle has a history of female breast cancer at age  36.  Several of Ms. Tolliver's maternal aunts had skin cancer.  Both of Ms. Chewning's maternal grandmother and grandfather had a history of melanoma.  Ms. Glendenning reports that her paternal grandfather's mother had brain cancer.  No other maternal family history of cancer was reported.   Ms. Biagini father passed away at age 28 and did not have cancer to her knowledge.  Ms. Hernandez has limited information about her paternal family history.   Ms. Ziolkowski is unaware of previous family history of genetic testing for hereditary cancer risks. Patient's ancestors are of Native [place], [place], [place], and [place] decent. There is no reported Ashkenazi Jewish ancestry. There is no known consanguinity between Ms. Wimbish's parents.  GENETIC TEST RESULTS: Genetic testing reported out on July 26, 2020.  The [facility] Multi-Cancer +RNA Panel  found no pathogenic mutations. The Multi-Cancer + RNA Panel offered by [facility] includes sequencing and/or deletion/duplication analysis of the following 84 genes:  AIP*, ALK, APC*, ATM*, AXIN2*, BAP1*, BARD1*, BLM*, BMPR1A*, BRCA1*, BRCA2*, BRIP1*, CASR, CDC73*, CDH1*, CDK4, CDKN1B*, CDKN1C*, CDKN2A, CEBPA, CHEK2*, CTNNA1*, DICER1*, DIS3L2*, EGFR, EPCAM, FH*, FLCN*, GATA2*, GPC3, GREM1, HOXB13, HRAS, KIT, MAX*, MEN1*, MET, MITF, MLH1*, MSH2*, MSH3*, MSH6*, MUTYH*, NBN*, NF1*, NF2*, NTHL1*, PALB2*, PDGFRA, PHOX2B, PMS2*, POLD1*, POLE*, POT1*, PRKAR1A*, PTCH1*, PTEN*, RAD50*, RAD51C*, RAD51D*,  RB1*, RECQL4, RET, RUNX1*, SDHA*, SDHAF2*, SDHB*, SDHC*, SDHD*, SMAD4*, SMARCA4*, SMARCB1*, SMARCE1*, STK11*, SUFU*, TERC, TERT, TMEM127*, Tp53*, TSC1*, TSC2*, VHL*, WRN*, and WT1.  RNA analysis is performed for * genes.  The test report has been scanned into EPIC and is located under the Molecular Pathology section of the Results Review tab.  A portion of the result report is included below for reference.     We discussed with Ms. Nolden that because current genetic testing is not perfect, it is  possible there may be a gene mutation in one of these genes that current testing cannot detect, but that chance is small.  We also discussed, that there could be another gene that has not yet been discovered, or that we have not yet tested, that is responsible for the cancer diagnoses in the family. It is also possible there is a hereditary cause for the cancer in the family that Ms. Roselli did not inherit and therefore was not identified in her testing.  Therefore, it is important to remain in touch with cancer genetics in the future so that we can continue to offer Ms. Drone the most up to date genetic testing.   Genetic testing did identify a variant of uncertain significance (VUS) was identified in the POLE gene called [id number] ([id number]).  At this time, it is unknown if this variant is associated with increased cancer risk or if this is a normal finding, but most variants such as this get reclassified to being inconsequential. It should not be used to make medical management decisions. With time, we suspect the lab will determine the significance of this variant, if any. If we do learn more about it, we will try to contact Ms. Tufo to discuss it further. However, it is important to stay in touch with [place] periodically and keep the address and phone number up to date.  ADDITIONAL GENETIC TESTING: We discussed with Ms. Lett that her genetic testing was fairly extensive.  If there are genes identified to increase cancer risk that can be analyzed in the future, we would be happy to discuss and coordinate this testing at that time.    CANCER SCREENING RECOMMENDATIONS: Ms. Matsushita test result is considered negative (normal).  This means that we have not identified a hereditary cause for her family history of cancer at this time. Most cancers happen by chance and this negative test suggests that her family's cancer may fall into this category.    Given Ms. Perriello's family histories, we must interpret these  negative results with some caution.  Families with features suggestive of hereditary risk for cancer tend to have multiple family members with cancer, diagnoses in multiple generations and diagnoses before the age of 56. Ms. Jobst family exhibits some of these features. Thus, this result may simply reflect our current inability to detect all mutations within these genes or there may be a different gene that has not yet been discovered or tested.   An individual's cancer risk and medical management are not determined by genetic test results alone. Overall cancer risk assessment incorporates additional factors, including personal medical history, family history, and any available genetic information that may result in a personalized plan for cancer prevention and surveillance.  Based on Ms. Spees's family of cancer, as well as her genetic test results, statistical models Harriett Rush)  and literature data were used to estimate her risk of developing breast cancer. These estimate her lifetime risk of developing breast cancer to be approximately 20.8%.  The patient's lifetime breast cancer risk is a preliminary estimate based on available information using one of several models endorsed by the [place] (ACS). The ACS recommends consideration of breast MRI screening as an adjunct to mammography for patients at high risk (defined as 20% or greater lifetime risk).     Ms. Mergen has been determined to be at high risk for breast cancer.  Therefore, we recommend that annual screening with mammography and breast MRI begin at age 62, or 10 years prior to the age of breast cancer diagnosis in a relative (whichever is earlier).  We discussed that Ms. Revuelta should discuss her individual situation with her referring physician and determine a breast cancer screening plan with which they are both comfortable.    RECOMMENDATIONS FOR FAMILY MEMBERS:  Individuals in this family might be at some increased risk  of developing cancer, over the general population risk, simply due to the family history of cancer.  We recommended women in this family have a yearly mammogram beginning at age 99, or 57 years younger than the earliest onset of cancer, an annual clinical breast exam, and perform monthly breast self-exams. Women in this family should also have a gynecological exam as recommended by their primary provider. All family members should be referred for colonoscopy starting at age 32.  It is also possible there is a hereditary cause for the cancer in Ms. Zimbelman's family that she did not inherit and therefore was not identified in her.  Based on Ms. Finlay's family history, we recommended her mother have genetic counseling and testing. Ms. Simic stated that her mother is not interested in genetic testing at this point in time but will let us know if we can be of any assistance in coordinating genetic counseling and/or testing in the future for this family member.   FOLLOW-UP: Lastly, we discussed with Ms. Funderburke that cancer genetics is a rapidly advancing field and it is possible that new genetic tests will be appropriate for her and/or her family members in the future. We encouraged her to remain in contact with cancer genetics on an annual basis so we can update her personal and family histories and let her know of advances in cancer genetics that may benefit this family.   Our contact number was provided. Ms. Granberry questions were answered to her satisfaction, and she knows she is welcome to call us at anytime with additional questions or concerns.   Nikitha Mode M. Joette Catching, [place], [facility] Genetic Counselor [email or web address] (P) [phone]

## 2020-08-04 ENCOUNTER — Encounter: Payer: Self-pay | Admitting: Family Medicine

## 2020-08-04 DIAGNOSIS — Z9189 Other specified personal risk factors, not elsewhere classified: Secondary | ICD-10-CM | POA: Insufficient documentation

## 2020-09-29 ENCOUNTER — Encounter: Payer: Medicaid Other | Admitting: Family Medicine

## 2020-10-04 ENCOUNTER — Other Ambulatory Visit: Payer: Self-pay

## 2020-10-04 ENCOUNTER — Encounter: Payer: Medicaid Other | Admitting: Family Medicine

## 2020-10-04 ENCOUNTER — Ambulatory Visit (INDEPENDENT_AMBULATORY_CARE_PROVIDER_SITE_OTHER): Payer: Medicaid Other | Admitting: Family Medicine

## 2020-10-04 ENCOUNTER — Encounter: Payer: Self-pay | Admitting: Family Medicine

## 2020-10-04 ENCOUNTER — Other Ambulatory Visit (HOSPITAL_COMMUNITY)
Admission: RE | Admit: 2020-10-04 | Discharge: 2020-10-04 | Disposition: A | Discharge status: Home / Self Care | Payer: Medicaid Other | Source: Ambulatory Visit | Attending: Family Medicine | Admitting: Family Medicine

## 2020-10-04 VITALS — BP 120/78 | HR 92 | Temp 98.4°F | Ht 64.0 in | Wt 296.2 lb

## 2020-10-04 DIAGNOSIS — F3342 Major depressive disorder, recurrent, in full remission: Secondary | ICD-10-CM

## 2020-10-04 DIAGNOSIS — Z716 Tobacco abuse counseling: Secondary | ICD-10-CM

## 2020-10-04 DIAGNOSIS — J4520 Mild intermittent asthma, uncomplicated: Secondary | ICD-10-CM

## 2020-10-04 DIAGNOSIS — N920 Excessive and frequent menstruation with regular cycle: Secondary | ICD-10-CM

## 2020-10-04 DIAGNOSIS — Z124 Encounter for screening for malignant neoplasm of cervix: Secondary | ICD-10-CM | POA: Diagnosis not present

## 2020-10-04 DIAGNOSIS — F17210 Nicotine dependence, cigarettes, uncomplicated: Secondary | ICD-10-CM

## 2020-10-04 DIAGNOSIS — Z0000 Encounter for general adult medical examination without abnormal findings: Secondary | ICD-10-CM

## 2020-10-04 DIAGNOSIS — Z7185 Encounter for immunization safety counseling: Secondary | ICD-10-CM

## 2020-10-04 DIAGNOSIS — Z9189 Other specified personal risk factors, not elsewhere classified: Secondary | ICD-10-CM | POA: Diagnosis not present

## 2020-10-04 LAB — COMPREHENSIVE METABOLIC PANEL
ALT: 14 U/L (ref 0–35)
AST: 17 U/L (ref 0–37)
Albumin: 3.9 g/dL (ref 3.5–5.2)
Alkaline Phosphatase: 59 U/L (ref 39–117)
BUN: 5 mg/dL — ABNORMAL LOW (ref 6–23)
CO2: 25 mEq/L (ref 19–32)
Calcium: 9.3 mg/dL (ref 8.4–10.5)
Chloride: 102 mEq/L (ref 96–112)
Creatinine, Ser: 0.74 mg/dL (ref 0.40–1.20)
GFR: 106.9 mL/min (ref >60.00)
Glucose, Bld: 76 mg/dL (ref 70–99)
Potassium: 4.4 mEq/L (ref 3.5–5.1)
Sodium: 135 mEq/L (ref 135–145)
Total Bilirubin: 0.5 mg/dL (ref 0.2–1.2)
Total Protein: 7.2 g/dL (ref 6.0–8.3)

## 2020-10-04 LAB — LIPID PANEL
Cholesterol: 170 mg/dL (ref 0–200)
HDL: 44.8 mg/dL (ref >39.00)
LDL Cholesterol: 98 mg/dL (ref 0–99)
NonHDL: 125.69
Total CHOL/HDL Ratio: 4
Triglycerides: 140 mg/dL (ref 0.0–149.0)
VLDL: 28 mg/dL (ref 0.0–40.0)

## 2020-10-04 LAB — CBC WITH DIFFERENTIAL/PLATELET
Basophils Absolute: 0.1 10*3/uL (ref 0.0–0.1)
Basophils Relative: 0.9 % (ref 0.0–3.0)
Eosinophils Absolute: 0.1 10*3/uL (ref 0.0–0.7)
Eosinophils Relative: 1 % (ref 0.0–5.0)
HCT: 41.7 % (ref 36.0–46.0)
Hemoglobin: 14 g/dL (ref 12.0–15.0)
Lymphocytes Relative: 28.4 % (ref 12.0–46.0)
Lymphs Abs: 3.2 10*3/uL (ref 0.7–4.0)
MCHC: 33.6 g/dL (ref 30.0–36.0)
MCV: 89.2 fl (ref 78.0–100.0)
Monocytes Absolute: 0.7 10*3/uL (ref 0.1–1.0)
Monocytes Relative: 5.9 % (ref 3.0–12.0)
Neutro Abs: 7.3 10*3/uL (ref 1.4–7.7)
Neutrophils Relative %: 63.8 % (ref 43.0–77.0)
Platelets: 347 10*3/uL (ref 150.0–400.0)
RBC: 4.68 Mil/uL (ref 3.87–5.11)
RDW: 14 % (ref 11.5–15.5)
WBC: 11.4 10*3/uL — ABNORMAL HIGH (ref 4.0–10.5)

## 2020-10-04 LAB — TSH: TSH: 4.8 u[IU]/mL — ABNORMAL HIGH (ref 0.35–4.50)

## 2020-10-04 NOTE — Patient Instructions (Signed)
Please return in 12 months for your annual complete physical; please come fasting. Sooner if needed for mood, asthma or stomach issues.   I will release your lab results to you on your MyChart account with further instructions. Please reply with any questions.   I have placed a referral to GYN to discuss options to manage your heavy menstrual cycles.   If you have any questions or concerns, please don't hesitate to send me a message via MyChart or call the office at [phone]. Thank you for visiting with [place] today! It's our pleasure caring for you.  Please do these things to maintain good health!   Exercise at least 30-45 minutes a day,  4-5 days a week.   Eat a low-fat diet with lots of fruits and vegetables, up to 7-9 servings per day.  Drink plenty of water daily. Try to drink 8 8oz glasses per day.  Seatbelts can save your life. Always wear your seatbelt.  Place Smoke Detectors on every level of your home and check batteries every year.  Schedule an appointment with an eye doctor for an eye exam every 1-2 years  Safe sex - use condoms to protect yourself from STDs if you could be exposed to these types of infections. Use birth control if you do not want to become pregnant and are sexually active.  Avoid heavy alcohol use. If you drink, keep it to less than 2 drinks/day and not every day.  Health Care Power of Attorney.  Choose someone you trust that could speak for you if you became unable to speak for yourself.  Depression is common in our stressful world.If you're feeling down or losing interest in things you normally enjoy, please come in for a visit.  If anyone is threatening or hurting you, please get help. Physical or Emotional Violence is never OK.

## 2020-10-04 NOTE — Progress Notes (Signed)
Subjective  Chief Complaint  Patient presents with  . Annual Exam    Fasting.  . Gynecologic Exam    Pap smear done in office today. Concerns about amounts of blood during menstrual.    HPI: Deborah Andrews is a 35 y.o. female who presents to [facility] today for a Female Wellness Visit.  She also has the concerns and/or needs as listed above in the chief complaint. These will be addressed in addition to the Health Maintenance Visit.   Wellness Visit: annual visit with health maintenance review and exam with Pap   Health maintenance: Patient due for Pap smear.  Reports history of cold-like cervical conization.  I do not have his records.  Reviewed recent genetics findings.  Negative genetic testing.  Remains high risk for breast cancer given strong family history.  Obesity: Continues to work on diet and exercise although she has had a stressful week given that her mother was in town.  She reports she feels worse when she is stressed. Chronic disease management visit and/or acute problem visit:  Mild intermittent asthma: Had an exacerbation during the winter but is now doing fine.  Rare albuterol use.  Smoker: Continues to be contemplative.  She had cut back but has increased her use, mainly stress-induced.  Not interested in smoking cessation medications at this time.  Understands risk.  Complains of heavy menstrual cycles, chronically.  She has been on birth control in the past.  She reports for contraceptives did not help eating.  She is status post tubal ligation so does not need birth control.  She is inquiring about other options for management of heavy bleeding.  She does not have pain.  She would be open to surgical maneuvers if needed.  She does not want more children.  Depression, chronic: Continues to do well although she has had increased stress.  She also has upcoming stress related to the health of her son who receives specialist people.  She is  concerned that this could impact her mood.  She remains off medication.  Since her life situation stressors are much improved here, she feels overall well.  Assessment  1. Annual physical exam   2. At high risk for breast cancer   3. Mild intermittent asthma without complication   4. Cigarette nicotine dependence without complication   5. Morbid obesity ([place])   6. Depression, major, recurrent, in complete remission ([place])   7. Menorrhagia with regular cycle   8. Encounter for smoking cessation counseling   9. Cervical cancer screening      Plan  Female Wellness Visit:  Age appropriate Health Maintenance and Prevention measures were discussed with patient. Included topics are cancer screening recommendations, ways to keep healthy (see AVS) including dietary and exercise recommendations, regular eye and dental care, use of seat belts, and avoidance of moderate alcohol use and tobacco use.  Pap smear with HPV testing done today.  BMI: discussed patient's BMI and encouraged positive lifestyle modifications to help get to or maintain a target BMI.  HM needs and immunizations were addressed and ordered. See below for orders. See HM and immunization section for updates.  Patient declines COVID vaccine and flu vaccine.  Education given.  Recommend she consider.  Routine labs and screening tests ordered including cmp, cbc and lipids where appropriate.  Discussed recommendations regarding Vit D and calcium supplementation (see AVS)  Chronic disease f/u and/or acute problem visit: (deemed necessary to be done in addition to  the wellness visit):  Menorrhagia: Refer to GYN to discuss options of treatment including endometrial ablation or hysterectomy.  Patient defers hormonal manipulation at this time and does not want an IUD  Asthma, mild intermittent: Currently controlled without complications.  Chronic recurrent depression in remission: We will continue to monitor closely.  Counseling done to  watch out for triggers and work stressors.  She will make an appointment if needed.  Nicotine dependence: Continue counseling.  Recommend coming back again.  She declines adjuvant therapy.  She declines counseling.  Morbid obesity: Discussed diet and exercise needs   Follow up: Return in about 1 year (around 10/04/2021) for complete physical.   Orders Placed This Encounter  Procedures  . CBC with Differential/Platelet  . Comprehensive metabolic panel  . Lipid panel  . HIV Antibody (routine testing w rflx)  . Hepatitis C antibody  . TSH  . Iron, TIBC and Ferritin Panel  . Ambulatory referral to Gynecology   No orders of the defined types were placed in this encounter.      Body mass index is 50.84 kg/m. Wt Readings from Last 3 Encounters:  10/04/20 296 lb 3.2 oz (134.4 kg)  06/07/20 293 lb (132.9 kg)  05/07/20 (!) 330 lb 11 oz (150 kg)     Patient Active Problem List   Diagnosis Date Noted  . At high risk for breast cancer 08/04/2020    Lifetime risk by [facility] -[facility]uscick 20.8%: consider annual breast MRI in conjunction with mammo   . Genetic testing 07/27/2020    Negative hereditary cancer genetic testing: no pathogenic variants detected in [facility] Multi-Cancer +RNA Panel.  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  The report date is July 26, 2020.   The Multi-Cancer + RNA Panel offered by [facility] includes sequencing and/or deletion/duplication analysis of the following 84 genes:  AIP*, ALK, APC*, ATM*, AXIN2*, BAP1*, BARD1*, BLM*, BMPR1A*, BRCA1*, BRCA2*, BRIP1*, CASR, CDC73*, CDH1*, CDK4, CDKN1B*, CDKN1C*, CDKN2A, CEBPA, CHEK2*, CTNNA1*, DICER1*, DIS3L2*, EGFR, EPCAM, FH*, FLCN*, GATA2*, GPC3, GREM1, HOXB13, HRAS, KIT, MAX*, MEN1*, MET, MITF, MLH1*, MSH2*, MSH3*, MSH6*, MUTYH*, NBN*, NF1*, NF2*, NTHL1*, PALB2*, PDGFRA, PHOX2B, PMS2*, POLD1*, POLE*, POT1*, PRKAR1A*, PTCH1*, PTEN*, RAD50*, RAD51C*, RAD51D*, RB1*, RECQL4, RET, RUNX1*, SDHA*,  SDHAF2*, SDHB*, SDHC*, SDHD*, SMAD4*, SMARCA4*, SMARCB1*, SMARCE1*, STK11*, SUFU*, TERC, TERT, TMEM127*, Tp53*, TSC1*, TSC2*, VHL*, WRN*, and WT1.  RNA analysis is performed for * genes.    . Mild intermittent asthma without complication 54/03/8118  . Cigarette nicotine dependence without complication 14/78/2956  . Morbid obesity ([place]) 06/07/2020  . Depression, major, recurrent, in complete remission ([place]) 06/07/2020    Long history; has been on multiple SSRIs in the past. Denies bipolar disorder    Health Maintenance  Topic Date Due  . Hepatitis C Screening  Never done  . HIV Screening  Never done  . PAP SMEAR-Modifier  01/21/2020  . INFLUENZA VACCINE  10/20/2020 (Originally 02/21/2020)  . COVID-19 Vaccine (1) 10/20/2020 (Originally 02/20/1993)  . TETANUS/TDAP  02/20/2026  . HPV VACCINES  Aged Out   Immunization History  Administered Date(s) Administered  . Tdap 02/21/2016   We updated and reviewed the patient's past history in detail and it is documented below. Allergies: Patient  reports no history of alcohol use. Past Medical History Patient  has a past medical history of Allergy, Anxiety, Apnea, Arthritis, Asthma, Depression, and Heart murmur. Past Surgical History Patient  has a past surgical history that includes Cesarean section; Cholecystectomy; Tonsillectomy; Wisdom tooth extraction; Tubal ligation; and  Cervical cone biopsy. Social History   Socioeconomic History  . Marital status: Divorced    Spouse name: boyfriend  . Number of children: Not on file  . Years of education: Not on file  . Highest education level: Not on file  Occupational History  . Not on file  Tobacco Use  . Smoking status: Current Every Day Smoker    Packs/day: 1.00    Years: 22.00    Pack years: 22.00    Types: Cigarettes  . Smokeless tobacco: Never Used  Substance and Sexual Activity  . Alcohol use: Never  . Drug use: Never  . Sexual activity: Yes    Birth control/protection: Surgical   Other Topics Concern  . Not on file  Social History Narrative  . Not on file   Social Determinants of Health   Financial Resource Strain: Not on file  Food Insecurity: Not on file  Transportation Needs: Not on file  Physical Activity: Not on file  Stress: Not on file  Social Connections: Not on file   Family History  Problem Relation Age of Onset  . Diabetes Mother   . Hypertension Mother   . Breast cancer Mother        dx before age 80  . Cancer Mother 58       Gyn. ?cervical ?ovarian   . Cancer - Other Mother 18       unknown primary. ?kidney  . Diabetes Father   . Breast cancer Maternal Uncle 75       female breast cancer  . Cervical cancer Half-Sister 29       maternal half sister  . Leukemia Half-Brother 2       maternal half brother  . Skin cancer Maternal Aunt        x3  . Melanoma Maternal Grandmother   . Melanoma Maternal Grandfather   . Cancer Maternal Grandfather        unknown type    Review of Systems: Constitutional: negative for fever or malaise Ophthalmic: negative for photophobia, double vision or loss of vision Cardiovascular: negative for chest pain, dyspnea on exertion, or new LE swelling Respiratory: negative for SOB or persistent cough Gastrointestinal: negative for abdominal pain, change in bowel habits or melena Genitourinary: negative for dysuria or gross hematuria, no abnormal uterine bleeding or disharge Musculoskeletal: negative for new gait disturbance or muscular weakness Integumentary: negative for new or persistent rashes, no breast lumps Neurological: negative for TIA or stroke symptoms Psychiatric: negative for SI or delusions Allergic/Immunologic: negative for hives  Patient Care Team    Relationship Specialty Notifications Start End  Leamon Arnt, MD PCP - General Family Medicine  02/21/20     Objective  Vitals: BP 120/78   Pulse 92   Temp 98.4 F (36.9 C)   Ht _0  (1.626 m)   Wt 296 lb 3.2 oz (134.4 kg)   LMP  09/18/2020   SpO2 97%   BMI 50.84 kg/m  General:  Well developed, well nourished, no acute distress  Psych:  Alert and orientedx3,normal mood and affect HEENT:  Normocephalic, atraumatic, non-icteric sclera, PERRL, supple neck without adenopathy, mass or thyromegaly Cardiovascular:  Normal S1, S2, RRR without gallop, rub or murmur Respiratory:  Good breath sounds bilaterally, CTAB with normal respiratory effort Gastrointestinal: normal bowel sounds, soft, non-tender, no noted masses. No HSM MSK: no deformities, contusions. Joints are without erythema or swelling.  Skin:  Warm, no rashes or suspicious lesions noted Neurologic:  Mental status is normal. Gross motor and sensory exams are normal. Normal gait. No tremor Breast Exam: No mass, skin retraction or nipple discharge is appreciated in either breast. No axillary adenopathy. Fibrocystic changes are not noted Pelvic Exam: Normal external genitalia, no vulvar or vaginal lesions present. Clear cervix w/o CMT, cervix appears normal. Bimanual exam reveals a nontender fundus w/o masses, nl size. No adnexal masses present. No inguinal adenopathy. A PAP smear was performed.     Commons side effects, risks, benefits, and alternatives for medications and treatment plan prescribed today were discussed, and the patient expressed understanding of the given instructions. Patient is instructed to call or message via MyChart if he/she has any questions or concerns regarding our treatment plan. No barriers to understanding were identified. We discussed Red Flag symptoms and signs in detail. Patient expressed understanding regarding what to do in case of urgent or emergency type symptoms.   Medication list was reconciled, printed and provided to the patient in AVS. Patient instructions and summary information was reviewed with the patient as documented in the AVS. This note was prepared with assistance of Dragon voice recognition software. Occasional  wrong-word or sound-a-like substitutions may have occurred due to the inherent limitations of voice recognition software  This visit occurred during the SARS-CoV-2 public health emergency.  Safety protocols were in place, including screening questions prior to the visit, additional usage of staff PPE, and extensive cleaning of exam room while observing appropriate contact time as indicated for disinfecting solutions.

## 2020-10-05 ENCOUNTER — Encounter: Payer: Self-pay | Admitting: Family Medicine

## 2020-10-05 LAB — HEPATITIS C ANTIBODY
Hepatitis C Ab: NONREACTIVE
SIGNAL TO CUT-OFF: 0.04 (ref <1.00)

## 2020-10-05 LAB — IRON,TIBC AND FERRITIN PANEL
%SAT: 15 % (calc) — ABNORMAL LOW (ref 16–45)
Ferritin: 70 ng/mL (ref 16–154)
Iron: 46 ug/dL (ref 40–190)
TIBC: 303 mcg/dL (calc) (ref 250–450)

## 2020-10-05 LAB — HIV ANTIBODY (ROUTINE TESTING W REFLEX): HIV 1&2 Ab, 4th Generation: NONREACTIVE

## 2020-10-10 LAB — CYTOLOGY - PAP
Comment: NEGATIVE
Comment: NEGATIVE
HPV 16: NEGATIVE
HPV 18 / 45: NEGATIVE
High risk HPV: POSITIVE — AB

## 2020-10-10 NOTE — Progress Notes (Signed)
Please call patient: I have reviewed his/her lab results. Pap smear results +HPV and LSIL. Please notify her and refer to GYN for abnormal pap smear. Pt will need to have old records regarding treatments in past for cervix available for GYN to review. Thanks.

## 2020-10-22 ENCOUNTER — Encounter: Payer: Self-pay | Admitting: Family Medicine

## 2020-10-24 ENCOUNTER — Encounter: Payer: Self-pay | Admitting: Physician Assistant

## 2020-11-09 ENCOUNTER — Other Ambulatory Visit: Payer: Self-pay

## 2020-11-09 ENCOUNTER — Encounter: Payer: Medicaid Other | Admitting: Student

## 2020-11-09 ENCOUNTER — Encounter: Payer: Self-pay | Admitting: Obstetrics and Gynecology

## 2020-11-09 ENCOUNTER — Ambulatory Visit (INDEPENDENT_AMBULATORY_CARE_PROVIDER_SITE_OTHER): Payer: Medicaid Other | Admitting: Obstetrics and Gynecology

## 2020-11-09 DIAGNOSIS — N870 Mild cervical dysplasia: Secondary | ICD-10-CM

## 2020-11-09 DIAGNOSIS — R102 Pelvic and perineal pain unspecified side: Secondary | ICD-10-CM | POA: Insufficient documentation

## 2020-11-09 DIAGNOSIS — N921 Excessive and frequent menstruation with irregular cycle: Secondary | ICD-10-CM | POA: Diagnosis not present

## 2020-11-09 DIAGNOSIS — N920 Excessive and frequent menstruation with regular cycle: Secondary | ICD-10-CM

## 2020-11-09 HISTORY — DX: Mild cervical dysplasia: N87.0

## 2020-11-09 HISTORY — DX: Excessive and frequent menstruation with regular cycle: N92.0

## 2020-11-09 LAB — POCT URINALYSIS DIP (DEVICE)
Bilirubin Urine: NEGATIVE
Glucose, UA: NEGATIVE mg/dL
Hgb urine dipstick: NEGATIVE
Ketones, ur: NEGATIVE mg/dL
Leukocytes,Ua: NEGATIVE
Nitrite: NEGATIVE
Protein, ur: NEGATIVE mg/dL
Specific Gravity, Urine: 1.015 (ref 1.005–1.030)
Urobilinogen, UA: 0.2 mg/dL (ref 0.0–1.0)
pH: 6 (ref 5.0–8.0)

## 2020-11-09 NOTE — Patient Instructions (Signed)
Endometrial Biopsy  An endometrial biopsy is a procedure to remove tissue samples from the endometrium, which is the lining of the uterus. The tissue that is removed can then be checked under a microscope for disease. This procedure is used to diagnose conditions such as endometrial cancer, endometrial tuberculosis, polyps, or other inflammatory conditions. This procedure may also be used to investigate uterine bleeding to determine where you are in your menstrual cycle or how your hormone levels are affecting the lining of the uterus. Tell a health care provider about:  Any allergies you have.  All medicines you are taking, including vitamins, herbs, eye drops, creams, and over-the-counter medicines.  Any problems you or family members have had with anesthetic medicines.  Any blood disorders you have.  Any surgeries you have had.  Any medical conditions you have.  Whether you are pregnant or may be pregnant. What are the risks? Generally, this is a safe procedure. However, problems may occur, including:  Bleeding.  Pelvic infection.  Puncture of the wall of the uterus with the biopsy device (rare).  Allergic reactions to medicines. What happens before the procedure?  Keep a record of your menstrual cycles as told by your health care provider. You may need to schedule your procedure for a specific time in your cycle.  You may want to bring a sanitary pad to wear after the procedure.  Plan to have someone take you home from the hospital or clinic.  Ask your health care provider about: ? Changing or stopping your regular medicines. This is especially important if you are taking diabetes medicines, arthritis medicines, or blood thinners. ? Taking medicines such as aspirin and ibuprofen. These medicines can thin your blood. Do not take these medicines unless your health care provider tells you to take them. ? Taking over-the-counter medicines, vitamins, herbs, and  supplements. What happens during the procedure?  You will lie on an exam table with your feet and legs supported as in a pelvic exam.  Your health care provider will insert an instrument (speculum) into your vagina to see your cervix.  Your cervix will be cleansed with an antiseptic solution.  A medicine (local anesthetic) will be used to numb the cervix.  A forceps instrument (tenaculum) will be used to hold your cervix steady for the biopsy.  A thin, rod-like instrument (uterine sound) will be inserted through your cervix to determine the length of your uterus and the location where the biopsy sample will be removed.  A thin, flexible tube (catheter) will be inserted through your cervix and into the uterus. The catheter will be used to collect the biopsy sample from your endometrial tissue.  The catheter and speculum will then be removed, and the tissue sample will be sent to a lab for examination. The procedure may vary among health care providers and hospitals. What can I expect after procedure?  You will rest in a recovery area until you are ready to go home.  You may have mild cramping and a small amount of vaginal bleeding. This is normal.  You may have a small amount of vaginal bleeding for a few days. This is normal.  It is up to you to get the results of your procedure. Ask your health care provider, or the department that is doing the procedure, when your results will be ready. Follow these instructions at home:  Take over-the-counter and prescription medicines only as told by your health care provider.  Do not douche, use tampons, or have   sexual intercourse until your health care provider approves.  Return to your normal activities as told by your health care provider. Ask your health care provider what activities are safe for you.  Follow instructions from your health care provider about any activity restrictions, such as restrictions on strenuous exercise or heavy  lifting.  Keep all follow-up visits. This is important. Contact a health care provider:  You have heavy bleeding, or bleed for longer than 2 days after the procedure.  You have bad smelling discharge from your vagina.  You have a fever or chills.  You have a burning sensation when urinating or you have difficulty urinating.  You have severe pain in your lower abdomen. Get help right away if you:  You have severe cramps in your stomach or back.  You pass large blood clots.  Your bleeding increases.  You become weak or light-headed, or you faint or lose consciousness. Summary  An endometrial biopsy is a procedure to remove tissue samples is taken from the endometrium, which is the lining of the uterus.  The tissue sample that is removed will be checked under a microscope for disease.  This procedure is used to diagnose conditions such as endometrial cancer, endometrial tuberculosis, polyps, or other inflammatory conditions.  After the procedure, it is common to have mild cramping and a small amount of vaginal bleeding for a few days.  Do not douche, use tampons, or have sexual intercourse until your health care provider approves. Ask your health care provider which activities are safe for you. This information is not intended to replace advice given to you by your health care provider. Make sure you discuss any questions you have with your health care provider. Document Revised: 02/01/2020 Document Reviewed: 02/01/2020 Elsevier Patient Education  2021 Elsevier Inc. [email or web address]">  Colposcopy  Colposcopy is a procedure to examine the lowest part of the uterus (cervix), the vagina, and the area around the vaginal opening (vulva) for abnormalities or signs of disease. This procedure is done using an instrument that makes objects appear larger and provides light. (colposcope). During the procedure, the  health care provider may remove a tissue sample to be looked at later under a microscope (biopsy). A biopsy may be done if any unusual cells are found during the colposcopy. You may have a colposcopy if you have:  An abnormal Pap smear, also called a Pap test. This screening test is used to check for signs of cancer or infection of the vagina, cervix, and uterus.  An HPV (human papillomavirus) test and get a positive result for a type of HPV that puts you at high risk of cancer.  Certain conditions or symptoms, such as: ? A sore, or lesion, on your cervix. ? Genital warts on your vulva, vagina, or cervix. ? Pain during sex. ? Vaginal bleeding, especially after sex.  A growth on your cervix (cervical polyp) that needs to be removed. Let your health care provider know about:  Any allergies you have, including allergies to medicines, latex, or iodine.  All medicines you are taking, including vitamins, herbs, eye drops, creams, and over-the-counter medicines.  Any problems you or family members have had with anesthetic medicines.  Any blood disorders you have.  Any surgeries you have had.  Any medical conditions you have, such as pelvic inflammatory disease (PID) or endometrial disorder.  The pattern of your menstrual cycles and the form of birth control (contraception) you use, if any.  Your medical history, including any history of fainting  often or of cervical treatment.  Whether you are pregnant or may be pregnant. What are the risks? Generally, this is a safe procedure. However, problems may occur, including:  Infection. Symptoms of infection may include fever, bad-smelling vaginal discharge, or pelvic pain.  Allergic reactions to medicines.  Damage to nearby structures or organs.  Fainting. This is rare. What happens before the procedure? Eating and drinking restrictions  Follow instructions from your health care provider about eating or drinking restrictions.  You  will likely need to eat a regular diet the day of the procedure and not skip any meals. Tests  You may have an exam or testing. A pregnancy test will be done the day of the procedure.  You may have a blood or urine sample taken. General instructions  Ask your health care provider about: ? Changing or stopping your regular medicines. This is especially important if you are taking diabetes medicines or blood thinners. ? Taking medicines such as aspirin and ibuprofen. These medicines can thin your blood. Do not take these medicines unless your health care provider tells you to take them. Your health care provider will likely tell you to avoid taking aspirin, or medicine that contains aspirin, for 7 days before the procedure. ? Taking over-the-counter medicines, vitamins, herbs, and supplements.  Tell your health care provider if you have your menstrual period now or will have it at the time of your procedure. A colposcopy is not normally done during your menstrual period.  If you use contraception, continue to use it before your procedure.  For 24 hours before the procedure: ? Do not use douche products or tampons. ? Do not use medicines, creams, or suppositories in the vagina. ? Do not have sex.  Ask your health care provider what steps will be taken to prevent infection. What happens during the procedure?  You will lie down on your back, with your feet in foot rests (stirrups).  A tool called a speculum will be warmed and will have oil or gel put on it (will be lubricated). The speculum will then be inserted into your vagina. This will be used to hold apart the walls of your vagina so your health care provider can see your cervix and the inside of your vagina.  A cotton swab will be used to place a small amount of a liquid (solution) on the areas to be examined. This solution makes it easier to see abnormal cells. You may feel a slight burning during this part.  The colposcope will be  used to scan the cervix with a bright white light. The colposcope will be held near your vulva and will make your vulva, vagina, and cervix look bigger so they can be seen better.  If a biopsy is needed: ? You may be given a medicine to numb the area (local anesthetic). ? Surgical tools will be used to remove mucus and cells through your vagina. ? You may feel mild pain while the tissue sample is removed. ? Bleeding may occur. A solution may be used to stop the bleeding. ? If a biopsy is needed from the inside of the cervix, a different procedure called endocervical curettage (ECC) may be done. During this procedure, a curved tool called a curette will be used to scrape cells from your cervix or the top of your cervix (endocervix).  Any abnormalities that are found will be recorded. The procedure may vary among health care providers and hospitals. What happens after the procedure?  You  will lie down and rest for a few minutes. You may be offered juice or cookies.  Your blood pressure, heart rate, breathing rate, and blood oxygen level will be monitored until you leave the hospital or clinic.  You may have some cramping in your abdomen. This should go away after a few minutes.  It is up to you to get the results of your procedure. Ask your health care provider, or the department that is doing the procedure, when your results will be ready. Summary  Colposcopy is a procedure to examine the lowest part of the uterus (cervix), the vagina, and the area around the vaginal opening (vulva) for abnormalities or signs of disease.  A biopsy may be done as part of the procedure.  After the procedure, you will remain lying down and will rest for a few minutes.  You may have some cramping in your abdomen. This should go away after a few minutes. This information is not intended to replace advice given to you by your health care provider. Make sure you discuss any questions you have with your health  care provider. Document Revised: 07/08/2019 Document Reviewed: 07/08/2019 Elsevier Patient Education  2021 [employer].

## 2020-11-09 NOTE — Progress Notes (Signed)
   Subjective:    Patient ID: Deborah Andrews, female    DOB: 1985/09/03, 35 y.o.   MRN: [id number]  HPI 35 yo G2P2, c/s x 2 seen for heavy irregular menses.  Pt will occasionally skip a month of her cycles.  Menses last 3-14 days.  Pt passes clots and has increased cramping.  Pt denies history of blood transfusion.  Pt also has hx of LGSIL.  She has had a code knife [facility] in [place], but we have no records from the previous MD   Review of Systems  Constitutional: Negative.   Respiratory: Negative.   Cardiovascular: Negative.   Genitourinary: Positive for menstrual problem.       Objective:   Physical Exam Constitutional:      Appearance: Normal appearance. She is obese. She is not toxic-appearing.  HENT:     Head: Normocephalic and atraumatic.  Cardiovascular:     Rate and Rhythm: Normal rate and regular rhythm.     Heart sounds: Normal heart sounds.  Pulmonary:     Effort: Pulmonary effort is normal.     Breath sounds: Normal breath sounds.  Abdominal:     General: There is no distension.     Palpations: There is no mass.     Tenderness: There is abdominal tenderness.  Genitourinary:    Comments: SVE: cervix is very firm, visually normal in appearance.  Uterus does not feel overly enlarged, exam suboptimal due to body habitus. No adnexal masses Neurological:     Mental Status: She is alert.    Vitals:   11/09/20 1114  BP: 110/62  Pulse: 67         Assessment & Plan:   1. Menorrhagia with irregular cycle Pelvic u/s ordered to evaluate size as well as any fibroids. Will get endometrial biopsy in 3 weeks Check bhcg, LH, FSH today Pt is not anemic per CBC 1 month ago The pt is very centered on hysterectomy even though there may be other less invasive interventions for her symptoms.  2.  LGSIL :  Per pap, will get colposcopy in 3 weeks at time of endometrial biopsy  3.  Pelvic pain: pelvic u/s pending, symptoms somewhat suspicious for endometriosis  F/u in 3  weeks  Warden Fillers, MD Faculty Attending, [facility]

## 2020-11-10 LAB — BETA HCG QUANT (REF LAB): hCG Quant: <1 m[IU]/mL

## 2020-11-10 LAB — FOLLICLE STIMULATING HORMONE: FSH: 2.8 m[IU]/mL

## 2020-11-10 LAB — LUTEINIZING HORMONE: LH: 3.3 m[IU]/mL

## 2020-12-01 ENCOUNTER — Telehealth: Payer: Self-pay

## 2020-12-01 ENCOUNTER — Ambulatory Visit: Payer: Medicaid Other | Admitting: Obstetrics and Gynecology

## 2020-12-01 NOTE — Telephone Encounter (Signed)
Pt called stating she was supposed to have a procedure done today at her OBGYN office. Pt states she went into the office wearing a cloth mask and they asked her to wear a surgical mask. Pt states she refused to wear the surgical mask because they irritate her asthma. Notes in pt chart from the visit state the patient refused to wear a mask for her appt. Pt is asking Dr. Mardelle Matte to place a new referral to an OBGYN outside of [facility]. Please advise.

## 2020-12-01 NOTE — Telephone Encounter (Signed)
Please Advise

## 2020-12-02 ENCOUNTER — Telehealth: Payer: Self-pay

## 2020-12-02 ENCOUNTER — Ambulatory Visit (INDEPENDENT_AMBULATORY_CARE_PROVIDER_SITE_OTHER): Payer: Medicaid Other | Admitting: Family Medicine

## 2020-12-02 ENCOUNTER — Other Ambulatory Visit: Payer: Self-pay

## 2020-12-02 ENCOUNTER — Encounter: Payer: Self-pay | Admitting: Family Medicine

## 2020-12-02 VITALS — BP 120/78 | HR 85 | Temp 98.2°F | Ht 63.5 in | Wt 301.0 lb

## 2020-12-02 DIAGNOSIS — F17210 Nicotine dependence, cigarettes, uncomplicated: Secondary | ICD-10-CM

## 2020-12-02 DIAGNOSIS — L03012 Cellulitis of left finger: Secondary | ICD-10-CM | POA: Diagnosis not present

## 2020-12-02 DIAGNOSIS — R238 Other skin changes: Secondary | ICD-10-CM | POA: Diagnosis not present

## 2020-12-02 DIAGNOSIS — Z716 Tobacco abuse counseling: Secondary | ICD-10-CM

## 2020-12-02 MED ORDER — DOXYCYCLINE HYCLATE 100 MG PO TABS: 100.0000 mg | ORAL_TABLET | Freq: Two times a day (BID) | ORAL | 0 refills | Status: AC

## 2020-12-02 MED ORDER — CHANTIX STARTING MONTH PAK 0.5 MG X 11 & 1 MG X 42 PO TABS: ORAL_TABLET | ORAL | 0 refills | Status: DC

## 2020-12-02 MED ORDER — NICOTINE 21 MG/24HR TD PT24: 21.0000 mg | MEDICATED_PATCH | Freq: Every day | TRANSDERMAL | 2 refills | Status: DC

## 2020-12-02 NOTE — Telephone Encounter (Signed)
Patient called letting us know that CHANTIX is no longer being made, and wanted to know if there was an alternative. Will speak with Jacob/Dr.Andy and get back with her.

## 2020-12-02 NOTE — Telephone Encounter (Signed)
Discussed at patient visit today

## 2020-12-02 NOTE — Progress Notes (Signed)
Subjective  CC:  Chief Complaint  Patient presents with  . Quit Smoking    Wants to look into patches or medication to stop smoking  . Hand Pain    Left hand, pointer finger. Swollen finger, thinks its infected     HPI: Deborah Andrews is a 35 y.o. female who presents to the office today to address the problems listed above in the chief complaint.  Long-term smoker.  Had smoked up to 2 packs/day in the past.  Now 1-1/2 packs/day.  Stressed smoking.  Chain smoking.  Has used patches in the past that worked well.  Has used Chantix in the past also.  Barriers to success included living with smokers in the past.  She no longer lives with smokers.  She is motivated for change.  She would like to be healthier.  She does have chronic anxiety but has failed Wellbutrin in the past.  She had no negative effects from Chantix.  Left ring finger with a rash at PIP.  Blisters noted.  Pain with swelling that radiates up the arm.  No fevers or chills.  She says she is gotten this recurrently in the same place over many years.  She was told it was an eczematous rash.  She was never given treatment for it.  It happens typically when she is stressed.  No spreading lesions.  No other lesions.  No systemic symptoms.  Assessment  1. Encounter for tobacco use cessation counseling   2. Cigarette nicotine dependence without complication   3. Cellulitis of finger of left hand   4. Vesicular rash      Plan   Long-term smoker wanting to quit: Smoking cessation counseling done.  Given her high use, will use Chantix along with nicotine patches.  Appropriate use and recommendations given.  Patient to follow-up in 4 weeks.  Rash looks herpetic in nature.  Await HSV culture.  Since she is 3 to 5 days into the rash will avoid empiric antivirals but will treat with doxycycline given swelling redness and pain to cover for cellulitis.  Education given.  Overweight results.  Follow up: Return in about 4 weeks (around  12/30/2020) for recheck.  04/13/2021  Orders Placed This Encounter  Procedures  . Herpes simplex virus culture   Meds ordered this encounter  Medications  . doxycycline (VIBRA-TABS) 100 MG tablet    Sig: Take 1 tablet (100 mg total) by mouth 2 (two) times daily for 7 days.    Dispense:  14 tablet    Refill:  0  . varenicline (CHANTIX STARTING MONTH PAK) 0.5 MG X 11 & 1 MG X 42 tablet    Sig: Take one 0.5 mg tablet by mouth once daily for 3 days, then increase to one 0.5 mg tablet twice daily for 4 days, then increase to one 1 mg tablet twice daily.    Dispense:  53 tablet    Refill:  0  . nicotine (NICODERM CQ - DOSED IN MG/24 HOURS) 21 mg/24hr patch    Sig: Place 1 patch (21 mg total) onto the skin daily.    Dispense:  28 patch    Refill:  2      I reviewed the patients updated PMH, FH, and SocHx.    Patient Active Problem List   Diagnosis Date Noted  . Menorrhagia with irregular cycle 11/09/2020  . Cervical dysplasia, mild 11/09/2020  . Pelvic pain in female 11/09/2020  . At high risk for breast cancer 08/04/2020  .  Genetic testing 07/27/2020  . Mild intermittent asthma without complication 06/07/2020  . Cigarette nicotine dependence without complication 06/07/2020  . Morbid obesity (HCC) 06/07/2020  . Depression, major, recurrent, in complete remission (HCC) 06/07/2020   Current Meds  Medication Sig  . albuterol (VENTOLIN HFA) 108 (90 Base) MCG/ACT inhaler Inhale 2 puffs into the lungs every 4 (four) hours as needed for wheezing or shortness of breath.  . doxycycline (VIBRA-TABS) 100 MG tablet Take 1 tablet (100 mg total) by mouth 2 (two) times daily for 7 days.  . nicotine (NICODERM CQ - DOSED IN MG/24 HOURS) 21 mg/24hr patch Place 1 patch (21 mg total) onto the skin daily.  . varenicline (CHANTIX STARTING MONTH PAK) 0.5 MG X 11 & 1 MG X 42 tablet Take one 0.5 mg tablet by mouth once daily for 3 days, then increase to one 0.5 mg tablet twice daily for 4 days, then  increase to one 1 mg tablet twice daily.    Allergies: Patient has No Known Allergies. Family History: Patient family history includes Breast cancer in her mother; Breast cancer (age of onset: 71) in her maternal uncle; Cancer in her maternal grandfather; Cancer (age of onset: 24) in her mother; Cancer - Other (age of onset: 99) in her mother; Cervical cancer (age of onset: 3) in her half-sister; Diabetes in her father and mother; Hypertension in her mother; Leukemia (age of onset: 2) in her half-brother; Melanoma in her maternal grandfather and maternal grandmother; Skin cancer in her maternal aunt. Social History:  Patient  reports that she has been smoking cigarettes. She has a 22.00 pack-year smoking history. She has never used smokeless tobacco. She reports current alcohol use. She reports that she does not use drugs.  Review of Systems: Constitutional: Negative for fever malaise or anorexia Cardiovascular: negative for chest pain Respiratory: negative for SOB or persistent cough Gastrointestinal: negative for abdominal pain  Objective  Vitals: BP 120/78   Pulse 85   Temp 98.2 F (36.8 C) (Temporal)   Ht 5' 3.5" (1.613 m)   Wt (!) 301 lb (136.5 kg)   LMP 11/14/2020 (Exact Date)   SpO2 97%   BMI 52.48 kg/m  General: no acute distress , A&Ox3 Skin:  Warm, left volar PIP with vesicular rash, quarter sized, erythematous base, very tender, swelling is present in finger with streaking up the hand and wrist.  No fluctuance     Commons side effects, risks, benefits, and alternatives for medications and treatment plan prescribed today were discussed, and the patient expressed understanding of the given instructions. Patient is instructed to call or message via MyChart if he/she has any questions or concerns regarding our treatment plan. No barriers to understanding were identified. We discussed Red Flag symptoms and signs in detail. Patient expressed understanding regarding what to do  in case of urgent or emergency type symptoms.   Medication list was reconciled, printed and provided to the patient in AVS. Patient instructions and summary information was reviewed with the patient as documented in the AVS. This note was prepared with assistance of Dragon voice recognition software. Occasional wrong-word or sound-a-like substitutions may have occurred due to the inherent limitations of voice recognition software  This visit occurred during the SARS-CoV-2 public health emergency.  Safety protocols were in place, including screening questions prior to the visit, additional usage of staff PPE, and extensive cleaning of exam room while observing appropriate contact time as indicated for disinfecting solutions.

## 2020-12-02 NOTE — Patient Instructions (Signed)
Please return in 4 weeks to recheck smoking cessation medications  I will release your lab results to you on your MyChart account with further instructions. Please reply with any questions.    If you have any questions or concerns, please don't hesitate to send me a message via MyChart or call the office at [phone]. Thank you for visiting with [place] today! It's our pleasure caring for you.   Managing the Challenge of Quitting Smoking Quitting smoking is a physical and mental challenge. You will face cravings, withdrawal symptoms, and temptation. Before quitting, work with your health care provider to make a plan that can help you manage quitting. Preparation can help you quit and keep you from giving in. How to manage lifestyle changes Managing stress Stress can make you want to smoke, and wanting to smoke may cause stress. It is important to find ways to manage your stress. You might try some of the following:  Practice relaxation techniques. ? Breathe slowly and deeply, in through your nose and out through your mouth. ? Listen to music. ? Soak in a bath or take a shower. ? Imagine a peaceful place or vacation.  Get some support. ? Talk with family or friends about your stress. ? Join a support group. ? Talk with a counselor or therapist.  Get some physical activity. ? Go for a walk, run, or bike ride. ? Play a favorite sport. ? Practice yoga.   Medicines Talk with your health care provider about medicines that might help you deal with cravings and make quitting easier for you. Relationships Social situations can be difficult when you are quitting smoking. To manage this, you can:  Avoid parties and other social situations where people might be smoking.  Avoid alcohol.  Leave right away if you have the urge to smoke.  Explain to your family and friends that you are quitting smoking. Ask for support and let them know you might be a bit grumpy.  Plan activities where  smoking is not an option. General instructions Be aware that many people gain weight after they quit smoking. However, not everyone does. To keep from gaining weight, have a plan in place before you quit and stick to the plan after you quit. Your plan should include:  Having healthy snacks. When you have a craving, it may help to: ? Eat popcorn, carrots, celery, or other cut vegetables. ? Chew sugar-free gum.  Changing how you eat. ? Eat small portion sizes at meals. ? Eat 4-6 small meals throughout the day instead of 1-2 large meals a day. ? Be mindful when you eat. Do not watch television or do other things that might distract you as you eat.  Exercising regularly. ? Make time to exercise each day. If you do not have time for a long workout, do short bouts of exercise for 5-10 minutes several times a day. ? Do some form of strengthening exercise, such as weight lifting. ? Do some exercise that gets your heart beating and causes you to breathe deeply, such as walking fast, running, swimming, or biking. This is very important.  Drinking plenty of water or other low-calorie or no-calorie drinks. Drink 6-8 glasses of water daily.   How to recognize withdrawal symptoms Your body and mind may experience discomfort as you try to get used to not having nicotine in your system. These effects are called withdrawal symptoms. They may include:  Feeling hungrier than normal.  Having trouble concentrating.  Feeling irritable or restless.  Having trouble sleeping.  Feeling depressed.  Craving a cigarette. To manage withdrawal symptoms:  Avoid places, people, and activities that trigger your cravings.  Remember why you want to quit.  Get plenty of sleep.  Avoid coffee and other caffeinated drinks. These may worsen some of your symptoms. These symptoms may surprise you. But be assured that they are normal to have when quitting smoking. How to manage cravings Come up with a plan for how  to deal with your cravings. The plan should include the following:  A definition of the specific situation you want to deal with.  An alternative action you will take.  A clear idea for how this action will help.  The name of someone who might help you with this. Cravings usually last for 5-10 minutes. Consider taking the following actions to help you with your plan to deal with cravings:  Keep your mouth busy. ? Chew sugar-free gum. ? Suck on hard candies or a straw. ? Brush your teeth.  Keep your hands and body busy. ? Change to a different activity right away. ? Squeeze or play with a ball. ? Do an activity or a hobby, such as making bead jewelry, practicing needlepoint, or working with wood. ? Mix up your normal routine. ? Take a short exercise break. Go for a quick walk or run up and down stairs.  Focus on doing something kind or helpful for someone else.  Call a friend or family member to talk during a craving.  Join a support group.  Contact a quitline. Where to find support To get help or find a support group:  Call the National Cancer Institute's Smoking Quitline: 1-800-QUIT NOW 610-875-2022)  Visit the website of the Substance Abuse and Mental Health Services Administration: SkateOasis.com.pt  Text QUIT to SmokefreeTXT: 952841 Where to find more information Visit these websites to find more information on quitting smoking:  National Cancer Institute: www.smokefree.gov  American Lung Association: www.lung.org  American Cancer Society: www.cancer.org  Centers for Disease Control and Prevention: FootballExhibition.com.br  American Heart Association: www.heart.org Contact a health care provider if:  You want to change your plan for quitting.  The medicines you are taking are not helping.  Your eating feels out of control or you cannot sleep. Get help right away if:  You feel depressed or become very anxious. Summary  Quitting smoking is a physical and mental challenge.  You will face cravings, withdrawal symptoms, and temptation to smoke again. Preparation can help you as you go through these challenges.  Try different techniques to manage stress, handle social situations, and prevent weight gain.  You can deal with cravings by keeping your mouth busy (such as by chewing gum), keeping your hands and body busy, calling family or friends, or contacting a quitline for people who want to quit smoking.  You can deal with withdrawal symptoms by avoiding places where people smoke, getting plenty of rest, and avoiding drinks with caffeine. This information is not intended to replace advice given to you by your health care provider. Make sure you discuss any questions you have with your health care provider. Document Revised: 04/28/2019 Document Reviewed: 04/28/2019 Elsevier Patient Education  2021 Elsevier Inc.   Steps to Quit Smoking Smoking tobacco is the leading cause of preventable death. It can affect almost every organ in the body. Smoking puts you and those around you at risk for developing many serious chronic diseases. Quitting smoking can be difficult, but it is one of the best things  that you can do for your health. It is never too late to quit. How do I get ready to quit? When you decide to quit smoking, create a plan to help you succeed. Before you quit:  Pick a date to quit. Set a date within the next 2 weeks to give you time to prepare.  Write down the reasons why you are quitting. Keep this list in places where you will see it often.  Tell your family, friends, and co-workers that you are quitting. Support from your loved ones can make quitting easier.  Talk with your health care provider about your options for quitting smoking.  Find out what treatment options are covered by your health insurance.  Identify people, places, things, and activities that make you want to smoke (triggers). Avoid them. What first steps can I take to quit  smoking?  Throw away all cigarettes at home, at work, and in your car.  Throw away smoking accessories, such as Set designer.  Clean your car. Make sure to empty the ashtray.  Clean your home, including curtains and carpets. What strategies can I use to quit smoking? Talk with your health care provider about combining strategies, such as taking medicines while you are also receiving in-person counseling. Using these two strategies together makes you more likely to succeed in quitting than if you used either strategy on its own.  If you are pregnant or breastfeeding, talk with your health care provider about finding counseling or other support strategies to quit smoking. Do not take medicine to help you quit smoking unless your health care provider tells you to do so. To quit smoking: Quit right away  Quit smoking completely, instead of gradually reducing how much you smoke over a period of time. Research shows that stopping smoking right away is more successful than gradually quitting.  Attend in-person counseling to help you build problem-solving skills. You are more likely to succeed in quitting if you attend counseling sessions regularly. Even short sessions of 10 minutes can be effective. Take medicine You may take medicines to help you quit smoking. Some medicines require a prescription and some you can purchase over-the-counter. Medicines may have nicotine in them to replace the nicotine in cigarettes. Medicines may:  Help to stop cravings.  Help to relieve withdrawal symptoms. Your health care provider may recommend:  Nicotine patches, gum, or lozenges.  Nicotine inhalers or sprays.  Non-nicotine medicine that is taken by mouth. Find resources Find resources and support systems that can help you to quit smoking and remain smoke-free after you quit. These resources are most helpful when you use them often. They include:  Online chats with a Veterinary surgeon.  Telephone  quitlines.  Printed Materials engineer.  Support groups or group counseling.  Text messaging programs.  Mobile phone apps or applications. Use apps that can help you stick to your quit plan by providing reminders, tips, and encouragement. There are many free apps for mobile devices as well as websites. Examples include Quit Guide from the [employer] and smokefree.gov   What things can I do to make it easier to quit?  Reach out to your family and friends for support and encouragement. Call telephone quitlines (1-800-QUIT-NOW), reach out to support groups, or work with a counselor for support.  Ask people who smoke to avoid smoking around you.  Avoid places that trigger you to smoke, such as bars, parties, or smoke-break areas at work.  Spend time with people who do not smoke.  Lessen the stress in your life. Stress can be a smoking trigger for some people. To lessen stress, try: ? Exercising regularly. ? Doing deep-breathing exercises. ? Doing yoga. ? Meditating. ? Performing a body scan. This involves closing your eyes, scanning your body from head to toe, and noticing which parts of your body are particularly tense. Try to relax the muscles in those areas.   How will I feel when I quit smoking? Day 1 to 3 weeks Within the first 24 hours of quitting smoking, you may start to feel withdrawal symptoms. These symptoms are usually most noticeable 2-3 days after quitting, but they usually do not last for more than 2-3 weeks. You may experience these symptoms:  Mood swings.  Restlessness, anxiety, or irritability.  Trouble concentrating.  Dizziness.  Strong cravings for sugary foods and nicotine.  Mild weight gain.  Constipation.  Nausea.  Coughing or a sore throat.  Changes in how the medicines that you take for unrelated issues work in your body.  Depression.  Trouble sleeping (insomnia). Week 3 and afterward After the first 2-3 weeks of quitting, you may start to notice  more positive results, such as:  Improved sense of smell and taste.  Decreased coughing and sore throat.  Slower heart rate.  Lower blood pressure.  Clearer skin.  The ability to breathe more easily.  Fewer sick days. Quitting smoking can be very challenging. Do not get discouraged if you are not successful the first time. Some people need to make many attempts to quit before they achieve long-term success. Do your best to stick to your quit plan, and talk with your health care provider if you have any questions or concerns. Summary  Smoking tobacco is the leading cause of preventable death. Quitting smoking is one of the best things that you can do for your health.  When you decide to quit smoking, create a plan to help you succeed.  Quit smoking right away, not slowly over a period of time.  When you start quitting, seek help from your health care provider, family, or friends. This information is not intended to replace advice given to you by your health care provider. Make sure you discuss any questions you have with your health care provider. Document Revised: 04/03/2019 Document Reviewed: 09/27/2018 Elsevier Patient Education  2021 [employer]

## 2020-12-05 ENCOUNTER — Encounter: Payer: Self-pay | Admitting: Family Medicine

## 2020-12-05 ENCOUNTER — Telehealth: Payer: Self-pay

## 2020-12-05 ENCOUNTER — Ambulatory Visit: Payer: Medicaid Other

## 2020-12-05 DIAGNOSIS — B009 Herpesviral infection, unspecified: Secondary | ICD-10-CM

## 2020-12-05 HISTORY — DX: Herpesviral infection, unspecified: B00.9

## 2020-12-05 LAB — HERPES SIMPLEX VIRUS CULTURE

## 2020-12-05 NOTE — Progress Notes (Signed)
Please call patient: I have reviewed his/her lab results. 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 This may return again. Please order valtrex 1000mg  po bid x  days to use if/when it recurs. No need for it now.  Thanks.

## 2020-12-05 NOTE — Telephone Encounter (Signed)
Will inform patient that at the moment there are no other alternatives for CHANTIX.

## 2020-12-06 ENCOUNTER — Other Ambulatory Visit: Payer: Self-pay

## 2020-12-06 DIAGNOSIS — R238 Other skin changes: Secondary | ICD-10-CM

## 2020-12-06 MED ORDER — VALACYCLOVIR HCL 1 G PO TABS: 1000.0000 mg | ORAL_TABLET | Freq: Two times a day (BID) | ORAL | 0 refills | Status: AC | PRN

## 2020-12-07 ENCOUNTER — Other Ambulatory Visit: Payer: Medicaid Other

## 2021-01-05 ENCOUNTER — Ambulatory Visit: Payer: Medicaid Other | Admitting: Family Medicine

## 2021-02-09 ENCOUNTER — Other Ambulatory Visit: Payer: Self-pay

## 2021-02-09 ENCOUNTER — Ambulatory Visit (INDEPENDENT_AMBULATORY_CARE_PROVIDER_SITE_OTHER): Payer: Medicaid Other | Admitting: Family Medicine

## 2021-02-09 ENCOUNTER — Encounter: Payer: Self-pay | Admitting: Family Medicine

## 2021-02-09 VITALS — BP 137/83 | HR 80 | Temp 97.9°F | Ht 63.5 in | Wt 296.6 lb

## 2021-02-09 DIAGNOSIS — Z716 Tobacco abuse counseling: Secondary | ICD-10-CM | POA: Diagnosis not present

## 2021-02-09 DIAGNOSIS — F3342 Major depressive disorder, recurrent, in full remission: Secondary | ICD-10-CM | POA: Diagnosis not present

## 2021-02-09 DIAGNOSIS — F17210 Nicotine dependence, cigarettes, uncomplicated: Secondary | ICD-10-CM

## 2021-02-09 DIAGNOSIS — F430 Acute stress reaction: Secondary | ICD-10-CM

## 2021-02-09 MED ORDER — ALPRAZOLAM 0.5 MG PO TABS: 0.5000 mg | ORAL_TABLET | Freq: Every day | ORAL | 0 refills | Status: DC | PRN

## 2021-02-09 MED ORDER — VARENICLINE TARTRATE 1 MG PO TABS
1.0000 mg | ORAL_TABLET | Freq: Two times a day (BID) | ORAL | 5 refills | Status: DC
Start: 2021-02-09 — End: 2021-04-13

## 2021-02-09 MED ORDER — VARENICLINE TARTRATE 0.5 MG X 11 & 1 MG X 42 PO MISC: ORAL | 0 refills | Status: DC

## 2021-02-09 MED ORDER — NICOTINE 21 MG/24HR TD PT24: 21.0000 mg | MEDICATED_PATCH | Freq: Every day | TRANSDERMAL | 2 refills | Status: DC
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
## 2021-02-09 NOTE — Patient Instructions (Signed)
Please return in 3 months for recheck smoking cessation and to adjust medications if needed.   If you have any questions or concerns, please don't hesitate to send me a message via MyChart or call the office at [phone]. Thank you for visiting with [place] today! It's our pleasure caring for you.

## 2021-02-13 ENCOUNTER — Telehealth: Payer: Self-pay

## 2021-02-13 NOTE — Telephone Encounter (Signed)
Patient called in and stated the pharmacy need a PA for varenicline (CHANTIX STARTING MONTH PAK) 0.5 MG X 11 & 1 MG X 42 tablet.

## 2021-02-14 NOTE — Telephone Encounter (Signed)
PA submitted through CoverMyMeds.

## 2021-02-15 NOTE — Telephone Encounter (Signed)
Patient is calling in stating she is really needing the medicine and requesting update.

## 2021-02-15 NOTE — Telephone Encounter (Signed)
I have resubmitted appeal to Medicaid. Waiting on response.

## 2021-02-20 NOTE — Telephone Encounter (Signed)
Both PA have been denied and the PA for generic Chantix has been denied.

## 2021-02-21 NOTE — Telephone Encounter (Signed)
Lvm for the pt to call the office to give message below.

## 2021-02-22 NOTE — Telephone Encounter (Signed)
Spoke to pt told her Both PA have been denied and the PA for generic Chantix has been denied. Pt verbalized understanding and said she does not understand it was filled in June. Asked pt if insurance changed? Pt said no, she will talk to the pharmacist and see what is wrong. Told her okay if you find out anything please let us know. Pt verbalized understanding.

## 2021-04-13 ENCOUNTER — Ambulatory Visit (INDEPENDENT_AMBULATORY_CARE_PROVIDER_SITE_OTHER): Payer: Medicaid Other | Admitting: Family Medicine

## 2021-04-13 ENCOUNTER — Encounter: Payer: Self-pay | Admitting: Family Medicine

## 2021-04-13 ENCOUNTER — Other Ambulatory Visit: Payer: Self-pay

## 2021-04-13 VITALS — BP 126/80 | HR 80 | Temp 98.4°F | Ht 64.0 in | Wt 294.6 lb

## 2021-04-13 DIAGNOSIS — F17210 Nicotine dependence, cigarettes, uncomplicated: Secondary | ICD-10-CM

## 2021-04-13 DIAGNOSIS — K219 Gastro-esophageal reflux disease without esophagitis: Secondary | ICD-10-CM

## 2021-04-13 DIAGNOSIS — N920 Excessive and frequent menstruation with regular cycle: Secondary | ICD-10-CM | POA: Diagnosis not present

## 2021-04-13 DIAGNOSIS — R7989 Other specified abnormal findings of blood chemistry: Secondary | ICD-10-CM

## 2021-04-13 DIAGNOSIS — R87612 Low grade squamous intraepithelial lesion on cytologic smear of cervix (LGSIL): Secondary | ICD-10-CM

## 2021-04-13 LAB — CBC WITH DIFFERENTIAL/PLATELET
Basophils Absolute: 0.1 10*3/uL (ref 0.0–0.1)
Basophils Relative: 0.8 % (ref 0.0–3.0)
Eosinophils Absolute: 0.1 10*3/uL (ref 0.0–0.7)
Eosinophils Relative: 1.3 % (ref 0.0–5.0)
HCT: 44.2 % (ref 36.0–46.0)
Hemoglobin: 14.7 g/dL (ref 12.0–15.0)
Lymphocytes Relative: 31.3 % (ref 12.0–46.0)
Lymphs Abs: 3.2 10*3/uL (ref 0.7–4.0)
MCHC: 33.3 g/dL (ref 30.0–36.0)
MCV: 92.7 fl (ref 78.0–100.0)
Monocytes Absolute: 0.5 10*3/uL (ref 0.1–1.0)
Monocytes Relative: 5.2 % (ref 3.0–12.0)
Neutro Abs: 6.3 10*3/uL (ref 1.4–7.7)
Neutrophils Relative %: 61.4 % (ref 43.0–77.0)
Platelets: 359 10*3/uL (ref 150.0–400.0)
RBC: 4.77 Mil/uL (ref 3.87–5.11)
RDW: 14.3 % (ref 11.5–15.5)
WBC: 10.3 10*3/uL (ref 4.0–10.5)

## 2021-04-13 LAB — TSH: TSH: 3.73 u[IU]/mL (ref 0.35–5.50)

## 2021-04-13 LAB — T4, FREE: Free T4: 0.89 ng/dL (ref 0.60–1.60)

## 2021-04-13 MED ORDER — OMEPRAZOLE 20 MG PO CPDR: 20.0000 mg | DELAYED_RELEASE_CAPSULE | Freq: Every day | ORAL | 3 refills | Status: DC

## 2021-04-13 NOTE — Patient Instructions (Signed)
Please return in 6 months 12 months for your annual complete physical; please come fasting.   I will release your lab results to you on your MyChart account with further instructions. Please reply with any questions.    Please call the GYN office to get scheduled for your colposcopy and endometrial biopsy. You may ask to speak to the practice manager to clarify their mask policy. This is very important.   If you have any questions or concerns, please don't hesitate to send me a message via MyChart or call the office at [phone]. Thank you for visiting with [place] today! It's our pleasure caring for you.

## 2021-04-13 NOTE — Progress Notes (Signed)
Subjective  CC:  Chief Complaint  Patient presents with   Iron    Check iron levels    HPI: Deborah Andrews is a 35 y.o. female who presents to the office today to address the problems listed above in the chief complaint. Heavy regular menses: reviewed GYN office notes from April. Pt was to get scheduled for endometrial biopsy but reports they refused to see her because she didn't have the correct mass. Also needs colposcopy due to lsil pap and h/o abnls.here to recheck cbc and iron screen for anemia. She is not on an iron supplement.  TSH ws 4.8 in march; here to recheck. C/o weight gain, fatigue.  Still smoking. Couldn't afford chantix. Uses patches rarely C/o midepigastric abd pain, postprandial. No n/v.    Assessment  1. Menorrhagia with regular cycle   2. Abnormal TSH   3. LGSIL on Pap smear of cervix   4. Cigarette nicotine dependence without complication   5. Gastroesophageal reflux disease, unspecified whether esophagitis present      Plan  Heavy menses/LSIL:  needs to get scheduled with GYN again. Asked her to call the office to reschedule and clarify mask requirements. Stressed importance of this. Screen for IDA Recheck thyroid levels. Replace if indicated.  Smoking cessation: difficult w/o meds due to stressors Likely GERD: education and PPI Declines flu vaccine.  Follow up: 6 mo for cpe  Visit date not found  Orders Placed This Encounter  Procedures   T3   T4, free   TSH   Iron, TIBC and Ferritin Panel   CBC with Differential/Platelet   Meds ordered this encounter  Medications   omeprazole (PRILOSEC) 20 MG capsule    Sig: Take 1 capsule (20 mg total) by mouth daily.    Dispense:  30 capsule    Refill:  3      I reviewed the patients updated PMH, FH, and SocHx.    Patient Active Problem List   Diagnosis Date Noted   Herpes simplex 12/05/2020   Menorrhagia with irregular cycle 11/09/2020   Cervical dysplasia, mild 11/09/2020   Pelvic pain in female  11/09/2020   At high risk for breast cancer 08/04/2020   Genetic testing 07/27/2020   Mild intermittent asthma without complication 06/07/2020   Cigarette nicotine dependence without complication 06/07/2020   Morbid obesity (HCC) 06/07/2020   Depression, major, recurrent, in complete remission (HCC) 06/07/2020   Current Meds  Medication Sig   albuterol (VENTOLIN HFA) 108 (90 Base) MCG/ACT inhaler Inhale 2 puffs into the lungs every 4 (four) hours as needed for wheezing or shortness of breath.   ALPRAZolam (XANAX) 0.5 MG tablet Take 1 tablet (0.5 mg total) by mouth daily as needed for anxiety.   omeprazole (PRILOSEC) 20 MG capsule Take 1 capsule (20 mg total) by mouth daily.   [DISCONTINUED] nicotine (NICODERM CQ - DOSED IN MG/24 HOURS) 21 mg/24hr patch Place 1 patch (21 mg total) onto the skin daily.    Allergies: Patient has No Known Allergies. Family History: Patient family history includes Breast cancer in her mother; Breast cancer (age of onset: 47) in her maternal uncle; Cancer in her maternal grandfather; Cancer (age of onset: 20) in her mother; Cancer - Other (age of onset: 39) in her mother; Cervical cancer (age of onset: 72) in her half-sister; Diabetes in her father and mother; Hypertension in her mother; Leukemia (age of onset: 2) in her half-brother; Melanoma in her maternal grandfather and maternal grandmother; Skin cancer in her  maternal aunt. Social History:  Patient  reports that she has been smoking cigarettes. She has a 22.00 pack-year smoking history. She has never used smokeless tobacco. She reports current alcohol use. She reports that she does not use drugs.  Review of Systems: Constitutional: Negative for fever malaise or anorexia Cardiovascular: negative for chest pain Respiratory: negative for SOB or persistent cough Gastrointestinal: negative for abdominal pain  Objective  Vitals: BP 126/80   Pulse 80   Temp 98.4 F (36.9 C)   Ht 5\' 4"  (1.626 m)   Wt 294  lb 9.6 oz (133.6 kg)   LMP 04/02/2021 (Exact Date)   SpO2 97%   BMI 50.57 kg/m  General: no acute distress , A&Ox3 HEENT: PEERL, conjunctiva normal, neck is supple No edema  Commons side effects, risks, benefits, and alternatives for medications and treatment plan prescribed today were discussed, and the patient expressed understanding of the given instructions. Patient is instructed to call or message via MyChart if he/she has any questions or concerns regarding our treatment plan. No barriers to understanding were identified. We discussed Red Flag symptoms and signs in detail. Patient expressed understanding regarding what to do in case of urgent or emergency type symptoms.  Medication list was reconciled, printed and provided to the patient in AVS. Patient instructions and summary information was reviewed with the patient as documented in the AVS. This note was prepared with assistance of Dragon voice recognition software. Occasional wrong-word or sound-a-like substitutions may have occurred due to the inherent limitations of voice recognition software  This visit occurred during the SARS-CoV-2 public health emergency.  Safety protocols were in place, including screening questions prior to the visit, additional usage of staff PPE, and extensive cleaning of exam room while observing appropriate contact time as indicated for disinfecting solutions.

## 2021-04-14 LAB — T3: T3, Total: 118 ng/dL (ref 76–181)

## 2021-04-14 LAB — IRON,TIBC AND FERRITIN PANEL
%SAT: 16 % (calc) (ref 16–45)
Ferritin: 74 ng/mL (ref 16–154)
Iron: 49 ug/dL (ref 40–190)
TIBC: 303 mcg/dL (calc) (ref 250–450)

## 2021-04-21 DIAGNOSIS — H5213 Myopia, bilateral: Secondary | ICD-10-CM | POA: Diagnosis not present

## 2021-05-11 ENCOUNTER — Other Ambulatory Visit: Payer: Self-pay | Admitting: Family Medicine

## 2021-05-16 ENCOUNTER — Ambulatory Visit: Payer: Medicaid Other | Admitting: Family Medicine

## 2021-05-22 ENCOUNTER — Encounter: Payer: Self-pay | Admitting: Obstetrics and Gynecology

## 2021-05-22 ENCOUNTER — Ambulatory Visit (INDEPENDENT_AMBULATORY_CARE_PROVIDER_SITE_OTHER): Payer: Medicaid Other | Admitting: Obstetrics and Gynecology

## 2021-05-22 ENCOUNTER — Other Ambulatory Visit: Payer: Self-pay

## 2021-05-22 ENCOUNTER — Other Ambulatory Visit (HOSPITAL_COMMUNITY)
Admission: RE | Admit: 2021-05-22 | Discharge: 2021-05-22 | Disposition: A | Discharge status: Home / Self Care | Payer: Medicaid Other | Source: Ambulatory Visit | Attending: Obstetrics and Gynecology | Admitting: Obstetrics and Gynecology

## 2021-05-22 VITALS — BP 124/78 | HR 70 | Wt 299.0 lb

## 2021-05-22 DIAGNOSIS — N921 Excessive and frequent menstruation with irregular cycle: Secondary | ICD-10-CM | POA: Insufficient documentation

## 2021-05-22 DIAGNOSIS — N870 Mild cervical dysplasia: Secondary | ICD-10-CM | POA: Diagnosis not present

## 2021-05-22 DIAGNOSIS — Z3202 Encounter for pregnancy test, result negative: Secondary | ICD-10-CM

## 2021-05-22 LAB — POCT PREGNANCY, URINE: Preg Test, Ur: NEGATIVE

## 2021-05-22 NOTE — Progress Notes (Signed)
Patient given informed consent, signed copy in the chart, time out was performed.  Placed in lithotomy position  Chart reviewed with CIN 1 noted.  Pt had hx of CKC. Pt also had need of endometrial biopsy.  Endometrial biopsy was performed first.  ENDOMETRIAL BIOPSY      Deborah Andrews is a 35 y.o. [id number] here for endometrial biopsy.  The indications for endometrial biopsy were reviewed.  Risks of the biopsy including cramping, bleeding, infection, uterine perforation, inadequate specimen and need for additional procedures were discussed. The patient states she understands and agrees to undergo procedure today. Consent was signed. Time out was performed.   Indications: irregular bleeding Urine HCG: neg  A bivalve speculum was placed into the vagina and the cervix was easily visualized and was prepped with Betadine x2. A single-toothed tenaculum was placed on the anterior lip of the cervix to stabilize it. The 3 mm pipelle was introduced into the endometrial cavity without difficulty to a depth of 10 cm, and a moderate amount of tissue was obtained and sent to pathology. This was repeated for a total of 3 passes. The instruments were removed from the patient's vagina. Minimal bleeding from the cervix at the tenaculum was noted.   The patient tolerated the procedure well. Routine post-procedure instructions were given to the patient.    Will base further management on results of biopsy.    Cervix viewed with speculum and colposcope after application of acetic acid.   Colposcopy adequate?  no Acetowhite lesions? Yes from 9-12 and from 3-6 o clock. C/w CIN 1 Punctation?no Mosaicism?  no Abnormal vasculature?  no Biopsies? 2 biopsies ECC? yes  COMMENTS: Patient was given post procedure instructions.   Will discuss treatment options once pathology returns.  Warden Fillers, MD

## 2021-05-24 LAB — SURGICAL PATHOLOGY

## 2021-06-08 ENCOUNTER — Ambulatory Visit (HOSPITAL_COMMUNITY)
Admission: RE | Admit: 2021-06-08 | Discharge: 2021-06-08 | Disposition: A | Discharge status: Home / Self Care | Payer: Medicaid Other | Source: Ambulatory Visit | Attending: Obstetrics and Gynecology | Admitting: Obstetrics and Gynecology

## 2021-06-08 ENCOUNTER — Ambulatory Visit (HOSPITAL_COMMUNITY): Payer: Medicaid Other

## 2021-06-08 ENCOUNTER — Other Ambulatory Visit: Payer: Self-pay

## 2021-06-08 DIAGNOSIS — N920 Excessive and frequent menstruation with regular cycle: Secondary | ICD-10-CM | POA: Diagnosis not present

## 2021-06-08 DIAGNOSIS — N888 Other specified noninflammatory disorders of cervix uteri: Secondary | ICD-10-CM | POA: Diagnosis not present

## 2021-06-08 DIAGNOSIS — N921 Excessive and frequent menstruation with irregular cycle: Secondary | ICD-10-CM | POA: Diagnosis not present

## 2021-07-12 ENCOUNTER — Ambulatory Visit (INDEPENDENT_AMBULATORY_CARE_PROVIDER_SITE_OTHER): Payer: Medicaid Other

## 2021-07-12 ENCOUNTER — Other Ambulatory Visit: Payer: Self-pay

## 2021-07-12 ENCOUNTER — Encounter: Payer: Self-pay | Admitting: Family Medicine

## 2021-07-12 ENCOUNTER — Encounter: Payer: Self-pay | Admitting: Obstetrics and Gynecology

## 2021-07-12 ENCOUNTER — Telehealth (INDEPENDENT_AMBULATORY_CARE_PROVIDER_SITE_OTHER): Payer: Medicaid Other | Admitting: Obstetrics and Gynecology

## 2021-07-12 ENCOUNTER — Ambulatory Visit (INDEPENDENT_AMBULATORY_CARE_PROVIDER_SITE_OTHER): Payer: Medicaid Other | Admitting: Family Medicine

## 2021-07-12 VITALS — BP 118/70 | HR 91 | Temp 98.3°F | Ht 64.0 in | Wt 300.0 lb

## 2021-07-12 DIAGNOSIS — N870 Mild cervical dysplasia: Secondary | ICD-10-CM | POA: Diagnosis not present

## 2021-07-12 DIAGNOSIS — R55 Syncope and collapse: Secondary | ICD-10-CM

## 2021-07-12 DIAGNOSIS — N921 Excessive and frequent menstruation with irregular cycle: Secondary | ICD-10-CM | POA: Diagnosis not present

## 2021-07-12 DIAGNOSIS — R609 Edema, unspecified: Secondary | ICD-10-CM | POA: Diagnosis not present

## 2021-07-12 DIAGNOSIS — R002 Palpitations: Secondary | ICD-10-CM

## 2021-07-12 NOTE — Progress Notes (Signed)
° ° °  GYNECOLOGY VIRTUAL VISIT ENCOUNTER NOTE  Provider location: [facility] for [facility]   Patient location: Home  I connected with Deborah Andrews on 07/12/21 at  3:55 PM EST by MyChart Video Encounter and verified that I am speaking with the correct person using two identifiers.   I discussed the limitations, risks, security and privacy concerns of performing an evaluation and management service virtually and the availability of in person appointments. I also discussed with the patient that there may be a patient responsible charge related to this service. The patient expressed understanding and agreed to proceed.   History:  Deborah Andrews is a 35 y.o. [id number] female being evaluated today for review of colposcopy biopsy and endometrial biopsy along with vaginal ultrasound results.  She notes heavy vaginal bleeding with her menses.   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   Past Surgical History:  Procedure Laterality Date   CERVICAL CONE BIOPSY     CESAREAN SECTION     CHOLECYSTECTOMY     TONSILLECTOMY     TUBAL LIGATION     WISDOM TOOTH EXTRACTION     The following portions of the patient's history were reviewed and updated as appropriate: allergies, current medications, past family history, past medical history, past social history, past surgical history and problem list.   Health Maintenance: LGSIL on last pap confirmed with colpo bx.    Review of Systems:  Pertinent items noted in HPI and remainder of comprehensive ROS otherwise negative.  Physical Exam:   General:  Alert, oriented and cooperative. Patient appears to be in no acute distress.  Mental Status: Normal mood and affect. Normal behavior. Normal judgment and thought content.   Respiratory: Normal respiratory  effort, no problems with respiration noted  Rest of physical exam deferred due to type of encounter  Labs and Imaging No results found for this or any previous visit (from the past 336 hour(s)). No results found.     Assessment and Plan:     Menorrhagia:  Discussed possible polyp that was found on endometrial biopsy.  Could perform hysteroscopy with resection of polyp followed by uterine ablation.  Pt desires definitive therapy if possible.  Will inquire about possible TVH even though she has had 2 cesarean sections.      I discussed the assessment and treatment plan with the patient. The patient was provided an opportunity to ask questions and all were answered. The patient agreed with the plan and demonstrated an understanding of the instructions.   The patient was advised to call back or seek an in-person evaluation/go to the ED if the symptoms worsen or if the condition fails to improve as anticipated.  I provided 20 minutes of face-to-face time during this encounter.   Warden Fillers, MD [facility] for [employer], [facility]

## 2021-07-12 NOTE — Patient Instructions (Addendum)
Please follow up as scheduled for your next visit with me: 10/13/2021   Your EKG is normal.  I have ordered 2 heart tests: a monitor and an ultrasound of the heart. We will call you to get you scheduled.   In the meantime, keep well hydrated, manage stressors, avoid caffeine and stimulants and get up slowly from seated positions to avoid further passing out episodes.   If you have any questions or concerns, please don't hesitate to send me a message via MyChart or call the office at [phone]. Thank you for visiting with [place] today! It's our pleasure caring for you.   Syncope, Adult Syncope refers to a condition in which a person temporarily loses consciousness. Syncope may also be called fainting or passing out. It is caused by a sudden decrease in blood flow to the brain. This can happen for a variety of reasons. Most causes of syncope are not dangerous. It can be triggered by things such as needle sticks, seeing blood, pain, or intense emotion. However, syncope can also be a sign of a serious medical problem, such as a heart abnormality. Other causes can include dehydration, migraines, or taking medicines that lower blood pressure. Your health care provider may do tests to find the reason why you are having syncope. If you faint, get medical help right away. Call your local emergency services (911 in the [place] Follow these instructions at home: Pay attention to any changes in your symptoms. Take these actions to stay safe and to help relieve your symptoms: Knowing when you may be about to faint Signs that you may be about to faint include: Feeling dizzy, weak, light-headed, or like the room is spinning. Feeling nauseous. Seeing spots or seeing all white or all black in your field of vision. Having cold, clammy skin or feeling warm and sweaty. Hearing ringing in the ears (tinnitus). If you start to feel like you might faint, sit or lie down right away. If sitting, put your head down between  your legs. If lying down, raise (elevate) your feet above the level of your heart. Breathe deeply and steadily. Wait until all the symptoms have passed. Have someone stay with you until you feel stable. Medicines Take over-the-counter and prescription medicines only as told by your health care provider. If you are taking blood pressure or heart medicine, get up slowly and take several minutes to sit and then stand. This can reduce dizziness and decrease the risk of syncope. Lifestyle Do not drive, use machinery, or play sports until your health care provider says it is okay. Do not drink alcohol. Do not use any products that contain nicotine or tobacco. These products include cigarettes, chewing tobacco, and vaping devices, such as e-cigarettes. If you need help quitting, ask your health care provider. Avoid hot tubs and saunas. General instructions Talk with your health care provider about your symptoms. You may need to have testing to understand the cause of your syncope. Drink enough fluid to keep your urine pale yellow. Avoid prolonged standing. If you must stand for a long time, do movements such as: Moving your legs. Crossing your legs. Flexing and stretching your leg muscles. Squatting. Keep all follow-up visits. This is important. Contact a health care provider if: You have episodes of near fainting. Get help right away if: You faint. You hit your head or are injured after fainting. You have any of these symptoms that may indicate trouble with your heart: Fast or irregular heartbeats (palpitations). Unusual pain in  your chest, abdomen, or back. Shortness of breath. You have a seizure. You have a severe headache. You are confused. You have vision problems. You have severe weakness or trouble walking. You are bleeding from your mouth or rectum, or you have black or tarry stool. These symptoms may represent a serious problem that is an emergency. Do not wait to see if your  symptoms will go away. Get medical help right away. Call your local emergency services (911 in the [place] Do not drive yourself to the hospital. Summary Syncope refers to a condition in which a person temporarily loses consciousness. Syncope may also be called fainting or passing out. It is caused by a sudden decrease in blood flow to the brain. Signs that you may be about to faint include dizziness, feeling light-headed, feeling nauseous, sudden vision changes, or cold, clammy skin. Even though most causes of syncope are not dangerous, syncope can be a sign of a serious medical problem. Get help right away if you faint. If you start to feel like you might faint, sit or lie down right away. If sitting, put your head down between your legs. If lying down, raise (elevate) your feet above the level of your heart. This information is not intended to replace advice given to you by your health care provider. Make sure you discuss any questions you have with your health care provider. Document Revised: 11/17/2020 Document Reviewed: 11/17/2020 Elsevier Patient Education  2022 [employer].

## 2021-07-12 NOTE — Progress Notes (Signed)
Subjective  CC:  Chief Complaint  Patient presents with   Loss of Consciousness    12/18   Palpitations   Leg Swelling    Bilateral     HPI: Deborah Andrews is a 35 y.o. female who presents to the office today to address the problems listed above in the chief complaint. 35 year old female presents due to syncopal episode several days ago.  She reports that she was in [place] visiting her mother, house was very warm so she stepped outside into the cool air.  She then got up from the porch to walk to the trunk and had a witnessed syncopal event.  No seizure activity.  She did strike her head on the truck.  She came to quickly and felt normal afterwards.  She admits to feeling lightheaded but no other presyncopal symptoms.  She reports she has been passing out intermittently for the last 11 years.  She reports she had a monitor done while she was in [place] but never got the results.  She has had EKGs in the past.  She also reports that at times she feels palpitations, her heart racing and pressure in the chest.  She has been seen for the symptoms and was told it was a panic attack.  She is also noticing some lower extremity edema that goes away when she elevates her legs.  She had recent lab work including TSH that was normal.  She has low cholesterol levels from her physical in March.  Today she feels normal.  She is concerned because her passing out episodes are coming more frequently now.  They often are after she gets up from a sitting or lying position.  She does not have hypertension, hypercholesterolemia or diabetes.  No premature family history of heart disease.  She denies shortness of breath, nausea vomiting or swelling. Lab Results  Component Value Date   WBC 10.3 04/13/2021   HGB 14.7 04/13/2021   HCT 44.2 04/13/2021   MCV 92.7 04/13/2021   PLT 359.0 04/13/2021   Lab Results  Component Value Date   TSH 3.73 04/13/2021   Lab Results  Component Value Date   CREATININE 0.74  10/04/2020   BUN 5 (L) 10/04/2020   NA 135 10/04/2020   K 4.4 10/04/2020   CL 102 10/04/2020   CO2 25 10/04/2020   Lab Results  Component Value Date   CHOL 170 10/04/2020   HDL 44.80 10/04/2020   LDLCALC 98 10/04/2020   TRIG 140.0 10/04/2020   CHOLHDL 4 10/04/2020   Reviewed EKG 10/21 NSR EKG today: NSR< normal EKG   Wt Readings from Last 3 Encounters:  07/12/21 300 lb (136.1 kg)  05/22/21 299 lb (135.6 kg)  04/13/21 294 lb 9.6 oz (133.6 kg)    Assessment  1. Syncope, unspecified syncope type   2. Palpitations   3. Morbid obesity (HCC)   4. Dependent edema      Plan  Syncope, recurrent: Suspect vasovagal or psychogenic.  However needs further evaluation.  Set up for heart monitor given complaints of persistent palpitations and echocardiogram of the heart to rule out valvular or structural heart disease.  No symptoms of ischemia and normal EKG today.  Reassured.  Recommend good hydration, getting up slowly from seated position, and stress management. Dependent edema likely related to morbid obesity: Elevating the feet.  Low-sodium diet.  Monitor  Follow up:  As scheduled 10/13/2021  Orders Placed This Encounter  Procedures   LONG TERM MONITOR (3-14  DAYS)   EKG 12-Lead   ECHOCARDIOGRAM COMPLETE   No orders of the defined types were placed in this encounter.     I reviewed the patients updated PMH, FH, and SocHx.    Patient Active Problem List   Diagnosis Date Noted   At high risk for breast cancer 08/04/2020    Priority: High   Cigarette nicotine dependence without complication 06/07/2020    Priority: High   Morbid obesity (HCC) 06/07/2020    Priority: High   Depression, major, recurrent, in complete remission (HCC) 06/07/2020    Priority: High   Menorrhagia with irregular cycle 11/09/2020    Priority: Medium    Dysplasia of cervix, low grade (CIN 1) 11/09/2020    Priority: Medium    Mild intermittent asthma without complication 06/07/2020    Priority:  Medium    Herpes simplex 12/05/2020    Priority: Low   Genetic testing 07/27/2020    Priority: Low   Pelvic pain in female 11/09/2020   Current Meds  Medication Sig   ALPRAZolam (XANAX) 0.5 MG tablet Take 1 tablet (0.5 mg total) by mouth daily as needed for anxiety.   omeprazole (PRILOSEC) 20 MG capsule Take 1 capsule (20 mg total) by mouth daily.   PROAIR HFA 108 (90 Base) MCG/ACT inhaler INHALE 2 PUFFS INTO THE LUNGS EVERY 4 HOURS AS NEEDED FOR WHEEZING OR SHORTNESS OF BREATH    Allergies: Patient has No Known Allergies. Family History: Patient family history includes Breast cancer in her mother; Breast cancer (age of onset: 65) in her maternal uncle; Cancer in her maternal grandfather; Cancer (age of onset: 52) in her mother; Cancer - Other (age of onset: 28) in her mother; Cervical cancer (age of onset: 84) in her half-sister; Diabetes in her father and mother; Hypertension in her mother; Leukemia (age of onset: 2) in her half-brother; Melanoma in her maternal grandfather and maternal grandmother; Skin cancer in her maternal aunt. Social History:  Patient  reports that she has been smoking cigarettes. She has a 22.00 pack-year smoking history. She has never used smokeless tobacco. She reports current alcohol use. She reports that she does not use drugs.  Review of Systems: Constitutional: Negative for fever malaise or anorexia Cardiovascular: negative for chest pain Respiratory: negative for SOB or persistent cough Gastrointestinal: negative for abdominal pain  Objective  Vitals: BP 118/70 (BP Location: Right Arm)    Pulse 91    Temp 98.3 F (36.8 C) (Temporal)    Ht 5\' 4"  (1.626 m)    Wt 300 lb (136.1 kg)    LMP 06/26/2021 (Exact Date)    SpO2 98%    BMI 51.49 kg/m  General: no acute distress , A&Ox3 Psych: Slightly anxious appearing HEENT: PEERL, conjunctiva normal, neck is supple Cardiovascular:  RRR without murmur or gallop.  Respiratory:  Good breath sounds bilaterally,  CTAB with normal respiratory effort Skin:  Warm, no rashes Extremities: no pitting edema    Commons side effects, risks, benefits, and alternatives for medications and treatment plan prescribed today were discussed, and the patient expressed understanding of the given instructions. Patient is instructed to call or message via MyChart if he/she has any questions or concerns regarding our treatment plan. No barriers to understanding were identified. We discussed Red Flag symptoms and signs in detail. Patient expressed understanding regarding what to do in case of urgent or emergency type symptoms.  Medication list was reconciled, printed and provided to the patient in AVS. Patient instructions and summary  information was reviewed with the patient as documented in the AVS. This note was prepared with assistance of Dragon voice recognition software. Occasional wrong-word or sound-a-like substitutions may have occurred due to the inherent limitations of voice recognition software  This visit occurred during the SARS-CoV-2 public health emergency.  Safety protocols were in place, including screening questions prior to the visit, additional usage of staff PPE, and extensive cleaning of exam room while observing appropriate contact time as indicated for disinfecting solutions.

## 2021-07-12 NOTE — Progress Notes (Unsigned)
Enrolled patient for a 7 day Zio XT monitor to be mailed to patients home.  

## 2021-07-12 NOTE — Progress Notes (Signed)
I connected with  Anda Latina on 07/12/21 at  3:55 PM EST by Mychart Virtual Video Visit and verified that I am speaking with the correct person using two identifiers.   I discussed the limitations, risks, security and privacy concerns of performing an evaluation and management service by telephone and the availability of in person appointments. I also discussed with the patient that there may be a patient responsible charge related to this service. The patient expressed understanding and agreed to proceed.  Guy Begin, CMA 07/12/2021  4:43 PM

## 2021-07-14 DIAGNOSIS — R55 Syncope and collapse: Secondary | ICD-10-CM

## 2021-07-14 DIAGNOSIS — R002 Palpitations: Secondary | ICD-10-CM | POA: Diagnosis not present

## 2021-07-27 ENCOUNTER — Telehealth: Payer: Self-pay | Admitting: Family Medicine

## 2021-07-27 NOTE — Telephone Encounter (Signed)
Pt called because she said an echo was put in for her on 07/12/21 and that she still hasnt heard anything back about it. She would like a call back at [phone]. Please advise

## 2021-08-01 ENCOUNTER — Encounter: Payer: Self-pay | Admitting: Family Medicine

## 2021-08-01 NOTE — Telephone Encounter (Signed)
Misty Stanley, can you please see what is going on with the echocardiogram. Still needs to be scheduled.   Thanks. Dr. Mardelle Matte

## 2021-08-03 ENCOUNTER — Encounter: Payer: Self-pay | Admitting: Family Medicine

## 2021-08-03 ENCOUNTER — Other Ambulatory Visit: Payer: Self-pay

## 2021-08-03 ENCOUNTER — Ambulatory Visit (HOSPITAL_COMMUNITY)
Admission: RE | Admit: 2021-08-03 | Discharge: 2021-08-03 | Disposition: A | Discharge status: Home / Self Care | Payer: Medicaid Other | Source: Ambulatory Visit | Attending: Family Medicine | Admitting: Family Medicine

## 2021-08-03 DIAGNOSIS — R011 Cardiac murmur, unspecified: Secondary | ICD-10-CM | POA: Diagnosis not present

## 2021-08-03 DIAGNOSIS — R002 Palpitations: Secondary | ICD-10-CM | POA: Diagnosis not present

## 2021-08-03 DIAGNOSIS — R55 Syncope and collapse: Secondary | ICD-10-CM | POA: Diagnosis not present

## 2021-08-03 LAB — ECHOCARDIOGRAM COMPLETE
Area-P 1/2: 3.83 cm2
Calc EF: 59 %
S' Lateral: 3.1 cm
Single Plane A2C EF: 58.2 %
Single Plane A4C EF: 59.4 %

## 2021-08-19 ENCOUNTER — Other Ambulatory Visit: Payer: Self-pay | Admitting: Family Medicine

## 2021-08-31 ENCOUNTER — Other Ambulatory Visit: Payer: Self-pay | Admitting: Family Medicine

## 2021-08-31 MED ORDER — THERA VITAL M PO TABS: 1.0000 | ORAL_TABLET | Freq: Every day | ORAL | 3 refills | Status: DC

## 2021-09-11 ENCOUNTER — Encounter: Payer: Self-pay | Admitting: Obstetrics and Gynecology

## 2021-09-11 ENCOUNTER — Other Ambulatory Visit: Payer: Self-pay

## 2021-09-11 ENCOUNTER — Ambulatory Visit (INDEPENDENT_AMBULATORY_CARE_PROVIDER_SITE_OTHER): Payer: Medicaid Other | Admitting: Obstetrics and Gynecology

## 2021-09-11 VITALS — BP 115/73 | HR 64 | Wt 308.0 lb

## 2021-09-11 DIAGNOSIS — N921 Excessive and frequent menstruation with irregular cycle: Secondary | ICD-10-CM | POA: Diagnosis not present

## 2021-09-11 NOTE — Patient Instructions (Signed)
Vaginal Hysterectomy, Care After The following information offers guidance on how to care for yourself after your procedure. Your health care provider may also give you more specific instructions. If you have problems or questions, contact your health care provider. What can I expect after the procedure? After the procedure, it is common to have: Pain in the lower abdomen and vagina. Vaginal bleeding and discharge for up to 1 week. You will need to use a sanitary pad after this procedure. Difficulty having a bowel movement (constipation). Temporary problems emptying the bladder. Tiredness (fatigue). Poor appetite. Less interest in sex. Feelings of sadness or other emotions. If your ovaries were also removed, it is also common to have symptoms of menopause, such as hot flashes, night sweats, and lack of sleep (insomnia). Follow these instructions at home: Medicines  Take over-the-counter and prescription medicines only as told by your health care provider. Do not take aspirin or NSAIDs, such as ibuprofen. These medicines can cause bleeding. Ask your health care provider if the medicine prescribed to you: Requires you to avoid driving or using machinery. Can cause constipation. You may need to take these actions to prevent or treat constipation: Drink enough fluid to keep your urine pale yellow. Take over-the-counter or prescription medicines. Eat foods that are high in fiber, such as beans, whole grains, and fresh fruits and vegetables. Limit foods that are high in fat and processed sugars, such as fried or sweet foods. Activity  Rest as told by your health care provider. Return to your normal activities as told by your health care provider. Ask your health care provider what activities are safe for you Avoid sitting for a long time without moving. Get up to take short walks every 1-2 hours. This is important to improve blood flow and breathing. Ask for help if you feel weak or  unsteady. Try to have someone home with you for 1-2 weeks to help you with everyday chores. Do not lift anything that is heavier than 10 lb (4.5 kg), or the limit that you are told, until your health care provider says that it is safe. If you were given a sedative during the procedure, it can affect you for several hours. Do not drive or operate machinery until your health care provider says that it is safe. Lifestyle Do not use any products that contain nicotine or tobacco. These products include cigarettes, chewing tobacco, and vaping devices, such as e-cigarettes. These can delay healing after surgery. If you need help quitting, ask your health care provider. Do not drink alcohol until your health care provider approves. General instructions Do not douche, use tampons, or have sex for at least 6 weeks, or as told by your health care provider. If you struggle with physical or emotional changes after your procedure, speak with your health care provider or a therapist. The stitches inside your vagina will dissolve over time and do not need to be taken out. Do not take baths, swim, or use a hot tub until your health care provider approves. You may only be allowed to take showers for 2-3 weeks Wear compression stockings as told by your health care provider. These stockings help to prevent blood clots and reduce swelling in your legs. Keep all follow-up visits. This is important. Contact a health care provider if: Your pain medicine is not helping. You have a fever. You have nausea or vomiting that does not go away. You feel dizzy. You have blood, pus, or a bad-smelling discharge from your vagina   more than 1 week after the procedure. You continue to have trouble urinating 3-5 days after the procedure. Get help right away if: You have severe pain in your abdomen or back. You faint. You have heavy vaginal bleeding and blood clots, soaking through a sanitary pad in less than 1 hour. You have chest  pain or shortness of breath. You have pain, swelling, or redness in your leg. These symptoms may represent a serious problem that is an emergency. Do not wait to see if the symptoms will go away. Get medical help right away. Call your local emergency services (911 in the [place] Do not drive yourself to the hospital. Summary After the procedure, it is common to have pain, vaginal bleeding, constipation, temporary problems emptying your bladder, and feelings of sadness or other emotions. Take over-the-counter and prescription medicines only as told by your health care provider. Rest as told by your health care provider. Return to your normal activities as told by your health care provider. Contact a health care provider if your pain medicine is not helping, or you have a fever, dizziness, or trouble urinating several days after the procedure. Get help right away if you have severe pain in your abdomen or back, or if you faint, have heavy bleeding, or have chest pain or shortness of breath. This information is not intended to replace advice given to you by your health care provider. Make sure you discuss any questions you have with your health care provider. Document Revised: 03/11/2020 Document Reviewed: 03/11/2020 Elsevier Patient Education  2022 [employer] Vaginal Hysterectomy A vaginal hysterectomy is a procedure to remove all or part of the uterus through a small incision in the vagina. In this procedure, your health care provider may remove your entire uterus, including the cervix. The cervix is the opening and bottom part of the uterus and is located between the vagina and the uterus. Sometimes, the ovaries and fallopian tubes are also removed. This surgery may be done to treat problems such as: Noncancerous growths in the uterus (uterine fibroids) that cause symptoms. A condition that causes the lining of the uterus to grow in other areas (endometriosis). Problems with pelvic  support. Cancer of the cervix, ovaries, uterus, or tissue that lines the uterus (endometrium). Excessive bleeding in the uterus. When removing your uterus, your health care provider may also remove the ovaries and the fallopian tubes. After this procedure, you will no longer be able to have a baby, and you will no longer have a menstrual period. Tell a health care provider about: Any allergies you have. All medicines you are taking, including vitamins, herbs, eye drops, creams, and over-the-counter medicines. Any problems you or family members have had with anesthetic medicines. Any blood disorders you have. Any surgeries you have had. Any medical conditions you have. Whether you are pregnant or may be pregnant. What are the risks? Generally, this is a safe procedure. However, problems may occur, including: Bleeding. Infection. Blood clots in the legs or lungs. Damage to nearby structures or organs. Pain during sex. Allergic reactions to medicines. What happens before the procedure? Staying hydrated Follow instructions from your health care provider about hydration, which may include: Up to 2 hours before the procedure - you may continue to drink clear liquids, such as water, clear fruit juice, black coffee, and plain tea.  Eating and drinking restrictions Follow instructions from your health care provider about eating and drinking, which may include: 8 hours before the procedure - stop eating heavy   meals or foods, such as meat, fried foods, or fatty foods. 6 hours before the procedure - stop eating light meals or foods, such as toast or cereal. 6 hours before the procedure - stop drinking milk or drinks that contain milk. 2 hours before the procedure - stop drinking clear liquids. Medicines Ask your health care provider about: Changing or stopping your regular medicines. This is especially important if you are taking diabetes medicines or blood thinners. Taking medicines such as  aspirin and ibuprofen. These medicines can thin your blood. Do not take these medicines unless your health care provider tells you to take them. Taking over-the-counter medicines, vitamins, herbs, and supplements. You may be asked to take a medicine to empty your colon (bowel preparation). General instructions If you were asked to do a bowel preparation before the procedure, follow instructions from your health care provider. This procedure can affect the way you feel about yourself. Talk with your health care provider about the physical and emotional changes hysterectomy may cause. Do not use any products that contain nicotine or tobacco for at least 4 weeks before the procedure. These products include cigarettes, e-cigarettes, and chewing tobacco. If you need help quitting, ask your health care provider. Plan to have a responsible adult take you home from the hospital or clinic. Plan to have a responsible adult care for you for the time you are told after you leave the hospital or clinic. This is important. Surgery safety Ask your health care provider: How your surgery site will be marked. What steps will be taken to help prevent infection. These may include: Removing hair at the surgery site. Washing skin with a germ-killing soap. Receiving antibiotic medicine. What happens during the procedure? An IV will be inserted into one of your veins. You will be given one or more of the following: A medicine to help you relax (sedative). A medicine to numb the area (local anesthetic). A medicine to make you fall asleep (general anesthetic). A medicine that is injected into your spine to numb the area below and slightly above the injection site (spinal anesthetic). A medicine that is injected into an area of your body to numb everything below the injection site (regional anesthetic). Your surgeon will make an incision in your vagina. Your surgeon will locate and remove all or part of your uterus.  Part or all of the uterus will be removed through the vagina. Your ovaries and fallopian tubes may be removed at the same time. The incision in your vagina will be closed with stitches (sutures) that dissolve over time. The procedure may vary among health care providers and hospitals. What happens after the procedure? Your blood pressure, heart rate, breathing rate, and blood oxygen level will be monitored until you leave the hospital or clinic. You will be encouraged to walk as soon as possible. You will also use a device or do breathing exercises to keep your lungs clear. You may have to wear compression stockings. These stockings help to prevent blood clots and reduce swelling in your legs. You will be given pain medicine as needed. You will need to wear a sanitary pad for vaginal discharge or bleeding. Summary A vaginal hysterectomy is a procedure to remove all or part of the uterus through the vagina. You may need a vaginal hysterectomy to treat a variety of abnormalities of the uterus. Plan to have a responsible adult take you home from the hospital or clinic. Plan to have a responsible adult care for you for   the time you are told after you leave the hospital or clinic. This is important. This information is not intended to replace advice given to you by your health care provider. Make sure you discuss any questions you have with your health care provider. Document Revised: 03/11/2020 Document Reviewed: 03/11/2020 Elsevier Patient Education  2022 Elsevier Inc.  

## 2021-09-11 NOTE — Progress Notes (Signed)
Patient here today for hysterectomy consultation.

## 2021-09-11 NOTE — Progress Notes (Signed)
Ms Reihl presents in referral from Dr L. Bass for consideration of TVH. Pt has H/O DUB and abnormal pap smears H/O LTCS x 2. H/O [facility] GYN U/S 11/22, normal, uterus 79 gms.  PE AF VSS Chaperone present during exam  Lungs clear Heart RRR Abd soft, + BS, obese GU Nl EGBUS, uterus small, mobile, slight tender, no adnexal masses or tenderness Exam limited by pt habitus  A/P DUB        Abnormal pap smear  By pelvic exam, I feel pt is a candidate for a TVH with possible salpingectomy. Procedure reviewed with pt. R/B/Post op care discussed. Information provided. Hysterectomy papers signed today. I will let Dr Donavan Foil know and he will coridinate surgery and post op care. F/U with post op appt.

## 2021-09-18 ENCOUNTER — Ambulatory Visit: Payer: Medicaid Other | Admitting: Family Medicine

## 2021-09-22 ENCOUNTER — Telehealth: Payer: Self-pay | Admitting: *Deleted

## 2021-09-22 NOTE — Telephone Encounter (Signed)
Spoke to pt told her received fax from pharmacy saying Rx for Varenicline/Chantix is not covered by your insurance plan. Pt said she does not understand she just picked up the starter pack last month with out any trouble. Told her I am not sure I would suggest you contact your insurance and see what they say and let us know. Pt verbalized understanding. ?

## 2021-09-22 NOTE — Telephone Encounter (Signed)
Pt called back she said she called [facility] and they said they sent over fax for PA to be done for medication. Told her okay even though it says plan does not cover we will do PA and as soon as we hear back will let you know. Pt verbalized understanding. ?

## 2021-09-22 NOTE — Telephone Encounter (Signed)
Received fax from pharmacy for Varenicline 1 mg tablets, requesting refill but it is not covered by plan. ?Discussed with Dr. Jonni Sanger and she said to let pt know and offer her to make an appt to discuss other options if she wants. ?

## 2021-09-22 NOTE — Telephone Encounter (Signed)
Key: [id number] - PA Case ID: [id number] ?Status ?Sent to [facility] ?Drug ?APO-Varenicline 1MG  tablets ?Waiting for determination  ?

## 2021-09-26 DIAGNOSIS — M9903 Segmental and somatic dysfunction of lumbar region: Secondary | ICD-10-CM | POA: Diagnosis not present

## 2021-09-26 DIAGNOSIS — M5386 Other specified dorsopathies, lumbar region: Secondary | ICD-10-CM | POA: Diagnosis not present

## 2021-09-26 DIAGNOSIS — M9904 Segmental and somatic dysfunction of sacral region: Secondary | ICD-10-CM | POA: Diagnosis not present

## 2021-09-26 DIAGNOSIS — M9905 Segmental and somatic dysfunction of pelvic region: Secondary | ICD-10-CM | POA: Diagnosis not present

## 2021-10-02 DIAGNOSIS — M9903 Segmental and somatic dysfunction of lumbar region: Secondary | ICD-10-CM | POA: Diagnosis not present

## 2021-10-02 DIAGNOSIS — M9905 Segmental and somatic dysfunction of pelvic region: Secondary | ICD-10-CM | POA: Diagnosis not present

## 2021-10-02 DIAGNOSIS — M9904 Segmental and somatic dysfunction of sacral region: Secondary | ICD-10-CM | POA: Diagnosis not present

## 2021-10-02 DIAGNOSIS — M5386 Other specified dorsopathies, lumbar region: Secondary | ICD-10-CM | POA: Diagnosis not present

## 2021-10-04 ENCOUNTER — Encounter: Payer: Self-pay | Admitting: Family

## 2021-10-04 ENCOUNTER — Ambulatory Visit: Payer: Medicaid Other | Admitting: Family

## 2021-10-04 VITALS — BP 134/82 | HR 86 | Temp 98.0°F | Ht 63.0 in | Wt 309.1 lb

## 2021-10-04 DIAGNOSIS — M542 Cervicalgia: Secondary | ICD-10-CM | POA: Diagnosis not present

## 2021-10-04 MED ORDER — IBUPROFEN 600 MG PO TABS: 600.0000 mg | ORAL_TABLET | Freq: Three times a day (TID) | ORAL | 0 refills | Status: DC | PRN

## 2021-10-04 MED ORDER — METHOCARBAMOL 500 MG PO TABS: 500.0000 mg | ORAL_TABLET | Freq: Four times a day (QID) | ORAL | 0 refills | Status: DC

## 2021-10-04 NOTE — Patient Instructions (Addendum)
It was very nice to see you today! ? ?I have sent a muscle relaxer (take at bedtime for pain) and an anti-inflammatory to take during day for your neck pain. ?You can also continue to apply heat and/or after applying an analgesic cream (Tiger balm, icy-hot, etc.) for 20-75minutes, 3 times/day. ?Rest and avoid heavy lifting or strenuous pulling/pushing/stretching for 1-2 weeks. ?Call back if your symptoms do not resolve or worsen.  ? ? ?

## 2021-10-04 NOTE — Progress Notes (Signed)
? ?Subjective:  ? ? ? Patient ID: Deborah Andrews, female    DOB: 07/02/1986, 36 y.o.   MRN: [id number] ? ?Chief Complaint  ?Patient presents with  ? Neck Pain  ?  Pt c/o neck pain and not able to turn her neck. Woke up this morning and was not able to turn her neck. Rotating between ice and heat.   ? ?HPI: ?Neck Pain: Paitent complains of neck pain. Event that precipitate these symptoms: this is a longstanding problem which has been getting worse. Onset of symptoms 2 months ago, unchanged since that time. Current symptoms are stiffness in right side of neck down to trapezius . Patient has had no prior neck problems.  Previous treatments include: just started seeing a chiropractor for neck adjustments - seen last Monday and says she felt better, but woke up today in a lot of pain. ? ?Health Maintenance Due  ?Topic Date Due  ? PAP SMEAR-Modifier  10/04/2021  ? ? ?Past Medical History:  ?Diagnosis Date  ? Allergy   ? Anxiety   ? Apnea   ? Arthritis   ? Asthma   ? Depression   ? Heart murmur   ? 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 11/2020. Valtrex prn outbreaks  ? ? ?Past Surgical History:  ?Procedure Laterality Date  ? CERVICAL CONE BIOPSY    ? CESAREAN SECTION    ? CHOLECYSTECTOMY    ? TONSILLECTOMY    ? TUBAL LIGATION    ? WISDOM TOOTH EXTRACTION    ? ? ?Outpatient Medications Prior to Visit  ?Medication Sig Dispense Refill  ? ALPRAZolam (XANAX) 0.5 MG tablet Take 1 tablet (0.5 mg total) by mouth daily as needed for anxiety. 30 tablet 0  ? Multiple Vitamins-Minerals (MULTIVITAMIN) tablet Take 1 tablet by mouth daily. 90 tablet 3  ? omeprazole (PRILOSEC) 20 MG capsule TAKE 1 CAPSULE(20 MG) BY MOUTH DAILY 30 capsule 3  ? PROAIR HFA 108 (90 Base) MCG/ACT inhaler INHALE 2 PUFFS INTO THE LUNGS EVERY 4 HOURS AS NEEDED FOR WHEEZING OR SHORTNESS OF BREATH 8.5 g 0  ? ?No facility-administered medications prior to visit.  ? ? ?No Known Allergies ? ? ?   ?Objective:  ?  ?Physical Exam ?Vitals and nursing note  reviewed.  ?Constitutional:   ?   Appearance: Normal appearance.  ?Cardiovascular:  ?   Rate and Rhythm: Normal rate and regular rhythm.  ?Pulmonary:  ?   Effort: Pulmonary effort is normal.  ?   Breath sounds: Normal breath sounds.  ?Musculoskeletal:  ?   Cervical back: Edema (down right trapezius) present. No rigidity or crepitus. Pain with movement (especially turning head to the right) and muscular tenderness present. Decreased range of motion.  ?Skin: ?   General: Skin is warm and dry.  ?Neurological:  ?   Mental Status: She is alert.  ?Psychiatric:     ?   Mood and Affect: Mood normal.     ?   Behavior: Behavior normal.  ? ? ?BP 134/82 (BP Location: Left Arm, Patient Position: Sitting, Cuff Size: Large)   Pulse 86   Temp 98 ?F (36.7 ?C) (Temporal)   Ht 5\' 3"  (1.6 m)   Wt (!) 309 lb 2 oz (140.2 kg)   LMP 09/20/2021 (Exact Date)   SpO2 97%   BMI 54.76 kg/m?  ?Wt Readings from Last 3 Encounters:  ?10/04/21 (!) 309 lb 2 oz (140.2 kg)  ?09/11/21 (!) 308 lb (139.7 kg)  ?07/12/21  300 lb (136.1 kg)  ? ?   ?Assessment & Plan:  ? ?Problem List Items Addressed This Visit   ?None ?Visit Diagnoses   ? ? Cervicalgia    -  Primary  ? pt has had pain for about 2 months, has been using bengay gel, heat & ice, Ibuprofen with little relief. Started seeing a chiropractor last week, pain was better, but then woke today with severe pain. Sending Robaxin qid prn with RX Ibuprofen, advised on use & SE. ? ?Relevant Medications  ? methocarbamol (ROBAXIN) 500 MG tablet  ? ibuprofen (ADVIL) 600 MG tablet  ? ?  ? ?Meds ordered this encounter  ?Medications  ? methocarbamol (ROBAXIN) 500 MG tablet  ?  Sig: Take 1-2 tablets (500-1,000 mg total) by mouth 4 (four) times daily. OK to take with Ibuprofen  ?  Dispense:  60 tablet  ?  Refill:  0  ?  Order Specific Question:   Supervising Provider  ?  Answer:   ANDY, CAMILLE L [[id number]]  ? ibuprofen (ADVIL) 600 MG tablet  ?  Sig: Take 1 tablet (600 mg total) by mouth every 8 (eight) hours as  needed.  ?  Dispense:  30 tablet  ?  Refill:  0  ?  Order Specific Question:   Supervising Provider  ?  Answer:   ANDY, CAMILLE L [[id number]]  ? ? ?Dulce Sellar, NP ? ?

## 2021-10-05 ENCOUNTER — Other Ambulatory Visit: Payer: Self-pay | Admitting: Family Medicine

## 2021-10-11 DIAGNOSIS — M9904 Segmental and somatic dysfunction of sacral region: Secondary | ICD-10-CM | POA: Diagnosis not present

## 2021-10-11 DIAGNOSIS — M5386 Other specified dorsopathies, lumbar region: Secondary | ICD-10-CM | POA: Diagnosis not present

## 2021-10-11 DIAGNOSIS — M9905 Segmental and somatic dysfunction of pelvic region: Secondary | ICD-10-CM | POA: Diagnosis not present

## 2021-10-11 DIAGNOSIS — M9903 Segmental and somatic dysfunction of lumbar region: Secondary | ICD-10-CM | POA: Diagnosis not present

## 2021-10-13 ENCOUNTER — Ambulatory Visit (INDEPENDENT_AMBULATORY_CARE_PROVIDER_SITE_OTHER): Payer: Medicaid Other | Admitting: Family Medicine

## 2021-10-13 ENCOUNTER — Encounter: Payer: Self-pay | Admitting: Family Medicine

## 2021-10-13 VITALS — BP 134/80 | HR 89 | Temp 98.3°F | Ht 63.0 in | Wt 313.0 lb

## 2021-10-13 DIAGNOSIS — F17210 Nicotine dependence, cigarettes, uncomplicated: Secondary | ICD-10-CM | POA: Diagnosis not present

## 2021-10-13 DIAGNOSIS — Z0000 Encounter for general adult medical examination without abnormal findings: Secondary | ICD-10-CM | POA: Diagnosis not present

## 2021-10-13 DIAGNOSIS — R0789 Other chest pain: Secondary | ICD-10-CM

## 2021-10-13 DIAGNOSIS — R55 Syncope and collapse: Secondary | ICD-10-CM | POA: Diagnosis not present

## 2021-10-13 DIAGNOSIS — N870 Mild cervical dysplasia: Secondary | ICD-10-CM

## 2021-10-13 DIAGNOSIS — F3342 Major depressive disorder, recurrent, in full remission: Secondary | ICD-10-CM | POA: Diagnosis not present

## 2021-10-13 DIAGNOSIS — R002 Palpitations: Secondary | ICD-10-CM

## 2021-10-13 LAB — COMPREHENSIVE METABOLIC PANEL
ALT: 13 U/L (ref 0–35)
AST: 15 U/L (ref 0–37)
Albumin: 4.1 g/dL (ref 3.5–5.2)
Alkaline Phosphatase: 62 U/L (ref 39–117)
BUN: 10 mg/dL (ref 6–23)
CO2: 27 mEq/L (ref 19–32)
Calcium: 9.4 mg/dL (ref 8.4–10.5)
Chloride: 105 mEq/L (ref 96–112)
Creatinine, Ser: 0.82 mg/dL (ref 0.40–1.20)
GFR: 93.84 mL/min (ref >60.00)
Glucose, Bld: 87 mg/dL (ref 70–99)
Potassium: 4.7 mEq/L (ref 3.5–5.1)
Sodium: 138 mEq/L (ref 135–145)
Total Bilirubin: 0.4 mg/dL (ref 0.2–1.2)
Total Protein: 7.2 g/dL (ref 6.0–8.3)

## 2021-10-13 LAB — LIPID PANEL
Cholesterol: 181 mg/dL (ref 0–200)
HDL: 47.3 mg/dL (ref >39.00)
LDL Cholesterol: 114 mg/dL — ABNORMAL HIGH (ref 0–99)
NonHDL: 133.85
Total CHOL/HDL Ratio: 4
Triglycerides: 101 mg/dL (ref 0.0–149.0)
VLDL: 20.2 mg/dL (ref 0.0–40.0)

## 2021-10-13 LAB — CBC WITH DIFFERENTIAL/PLATELET
Basophils Absolute: 0.1 10*3/uL (ref 0.0–0.1)
Basophils Relative: 1.2 % (ref 0.0–3.0)
Eosinophils Absolute: 0.3 10*3/uL (ref 0.0–0.7)
Eosinophils Relative: 2.2 % (ref 0.0–5.0)
HCT: 41.6 % (ref 36.0–46.0)
Hemoglobin: 13.8 g/dL (ref 12.0–15.0)
Lymphocytes Relative: 29.4 % (ref 12.0–46.0)
Lymphs Abs: 3.5 10*3/uL (ref 0.7–4.0)
MCHC: 33.2 g/dL (ref 30.0–36.0)
MCV: 90.2 fl (ref 78.0–100.0)
Monocytes Absolute: 0.6 10*3/uL (ref 0.1–1.0)
Monocytes Relative: 5.5 % (ref 3.0–12.0)
Neutro Abs: 7.3 10*3/uL (ref 1.4–7.7)
Neutrophils Relative %: 61.7 % (ref 43.0–77.0)
Platelets: 396 10*3/uL (ref 150.0–400.0)
RBC: 4.61 Mil/uL (ref 3.87–5.11)
RDW: 13.8 % (ref 11.5–15.5)
WBC: 11.8 10*3/uL — ABNORMAL HIGH (ref 4.0–10.5)

## 2021-10-13 NOTE — Progress Notes (Signed)
? ?Subjective  ?Chief Complaint  ?Patient presents with  ? Annual Exam  ?  Pt here for Annual exam but is not fating. She is still c/o heart fluttering. Pt is also interested in getting a shower chair bc she has been falling in the shower.  ? ? ?HPI: Deborah Andrews is a 36 y.o. female who presents to [facility] today for a Female Wellness Visit.  ?She also has the concerns and/or needs as listed above in the chief complaint. These will be addressed in addition to the Health Maintenance Visit.  ? ?Wellness Visit: annual visit with health maintenance review and exam without Pap ? ?Health maintenance: Patient had CIN-1 colpo October 2022.  Also with menorrhagia.  Reports she is electing partial hysterectomy in April.  Thus, no follow-up Will be needed.  Obesity: Continues to struggle with weight loss.  Little exercise.  Continues to smoke.  Precontemplative. ?Chronic disease management visit and/or acute problem visit: ?Syncope: Normal echocardiogram and heart monitor showing rare PVCs with a single 10 beat run of SVT.  However today she is complaining of atypical chest pain described as sharp pain in the left chest, occurs at rest, lasts minutes.  No exertional symptoms no associated nausea, vomiting or diaphoresis.  She denies stressors but has long history of depression.  Not currently on medications.  She also reports that she often will get very lightheaded in the shower and will fall.  If it gets too warm she feels lightheaded.  She has episodes of syncope in the past.  She is very concerned that she has "a blockage in her heart".  She has a family history. ?Mood: She denies symptoms of depression at this time. ? ?Assessment  ?1. Annual physical exam   ?2. Syncope, unspecified syncope type   ?3. Cigarette nicotine dependence without complication   ?4. Morbid obesity ([place])   ?5. Depression, major, recurrent, in complete remission ([place])   ?6. Dysplasia of cervix, low grade (CIN 1)   ?7.  Palpitations   ?8. Atypical chest pain   ? ?  ?Plan  ?Female Wellness Visit: ?Age appropriate Health Maintenance and Prevention measures were discussed with patient. Included topics are cancer screening recommendations, ways to keep healthy (see AVS) including dietary and exercise recommendations, regular eye and dental care, use of seat belts, and avoidance of moderate alcohol use and tobacco use.  No further Paps will be needed after her hysterectomy as long as her pathology is benign.  For hysterectomy in April.  No birth control needed ?BMI: discussed patient's BMI and encouraged positive lifestyle modifications to help get to or maintain a target BMI. ?HM needs and immunizations were addressed and ordered. See below for orders. See HM and immunization section for updates.  Up-to-date ?Routine labs and screening tests ordered including cmp, cbc and lipids where appropriate. ?Discussed recommendations regarding Vit D and calcium supplementation (see AVS) ? ?Chronic disease f/u and/or acute problem visit: (deemed necessary to be done in addition to the wellness visit): ?Syncope: Very unlikely to be cardiac related, however patient remains concerned about heart disease.  Request cardiology referral.  This could be psychogenic syncope.  Counseling education given.  Patient defers medication at this time.  If cardiac evaluation is negative we can consider restarting antidepressants.  Patient agrees recheck lab work.  Has been normal in the past. ?Smoking: We will continue to offer cessation help ?Morbid obesity: Discussed decreasing caloric intake and increasing activity ? ? ?Follow up:  12 months for complete physical ? ?Orders Placed This Encounter  ?Procedures  ? CBC with Differential/Platelet  ? Comprehensive metabolic panel  ? Lipid panel  ? TSH  ? Ambulatory referral to Cardiology  ? ?No orders of the defined types were placed in this encounter. ? ?  ? ? ?Body mass index is 55.45 kg/m?. ?Wt Readings from Last 3  Encounters:  ?10/13/21 (!) 313 lb (142 kg)  ?10/04/21 (!) 309 lb 2 oz (140.2 kg)  ?09/11/21 (!) 308 lb (139.7 kg)  ? ? ? ?Patient Active Problem List  ? Diagnosis Date Noted  ? At high risk for breast cancer 08/04/2020  ?  Priority: High  ?  Lifetime risk by [place] -cr[facility]ick 20.8%: consider annual breast MRI in conjunction with mammo ?  ? Cigarette nicotine dependence without complication 26/83/4196  ?  Priority: High  ? Morbid obesity ([place]) 06/07/2020  ?  Priority: High  ? Depression, major, recurrent, in complete remission ([place]) 06/07/2020  ?  Priority: High  ?  Long history; has been on multiple SSRIs in the past. Denies bipolar disorder ?  ? Menorrhagia with irregular cycle 11/09/2020  ?  Priority: Medium   ?  Normal endometrial biopsy 04/2021, Dr. Elgie Congo, gyn ?  ? Dysplasia of cervix, low grade (CIN 1) 11/09/2020  ?  Priority: Medium   ?  abnl pap 09/2020; colpo 04/2021 cin 1; repeat pap in 1 year per Dr. Elgie Congo, gyn ?  ? Mild intermittent asthma without complication 22/29/7989  ?  Priority: Medium   ? 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  Priority: Low  ?  Hand, recurrent, + by culture. 11/2020. Valtrex prn outbreaks ?  ? Genetic testing 07/27/2020  ?  Priority: Low  ?  Negative hereditary cancer genetic testing: no pathogenic variants detected in [facility] Multi-Cancer +RNA Panel.  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  The report date is July 26, 2020.  ? ?The Multi-Cancer + RNA Panel offered by [facility] includes sequencing and/or deletion/duplication analysis of the following 84 genes:  AIP*, ALK, APC*, ATM*, AXIN2*, BAP1*, BARD1*, BLM*, BMPR1A*, BRCA1*, BRCA2*, BRIP1*, CASR, CDC73*, CDH1*, CDK4, CDKN1B*, CDKN1C*, CDKN2A, CEBPA, CHEK2*, CTNNA1*, DICER1*, DIS3L2*, EGFR, EPCAM, FH*, FLCN*, GATA2*, GPC3, GREM1, HOXB13, HRAS, KIT, MAX*, MEN1*, MET, MITF, MLH1*, MSH2*, MSH3*, MSH6*, MUTYH*, NBN*, NF1*, NF2*, NTHL1*, PALB2*, PDGFRA, PHOX2B, PMS2*, POLD1*, POLE*, POT1*, PRKAR1A*, PTCH1*, PTEN*,  RAD50*, RAD51C*, RAD51D*, RB1*, RECQL4, RET, RUNX1*, SDHA*, SDHAF2*, SDHB*, SDHC*, SDHD*, SMAD4*, SMARCA4*, SMARCB1*, SMARCE1*, STK11*, SUFU*, TERC, TERT, TMEM127*, Tp53*, TSC1*, TSC2*, VHL*, WRN*, and WT1.  RNA analysis is performed for * genes. ? ?  ? Syncope 2022 08/03/2021  ?  Nl echocardiogram, labs and 14 day holter (rare pvc's with one 10 beat run of SVT): offered BB prn ?  ? Pelvic pain in female 11/09/2020  ? ?Health Maintenance  ?Topic Date Due  ? TETANUS/TDAP  02/20/2026  ? Hepatitis C Screening  Completed  ? HIV Screening  Completed  ? HPV VACCINES  Aged Out  ? INFLUENZA VACCINE  Discontinued  ? PAP SMEAR-Modifier  Discontinued  ? COVID-19 Vaccine  Discontinued  ? ?Immunization History  ?Administered Date(s) Administered  ? Tdap 02/21/2016  ? ?We updated and reviewed the patient's past history in detail and it is documented below. ?Allergies: ?Patient  reports current alcohol use. ?Past Medical History ?Patient  has a past medical history of Allergy, Anxiety, Apnea, Arthritis, Asthma, Depression, Heart murmur, and Herpes simplex (12/05/2020). ?Past Surgical History ?Patient  has  a past surgical history that includes Cesarean section; Cholecystectomy; Tonsillectomy; Wisdom tooth extraction; Tubal ligation; and Cervical cone biopsy. ?Social History  ? ?Socioeconomic History  ? Marital status: Divorced  ?  Spouse name: boyfriend  ? Number of children: Not on file  ? Years of education: Not on file  ? Highest education level: Not on file  ?Occupational History  ? Not on file  ?Tobacco Use  ? Smoking status: Every Day  ?  Packs/day: 1.00  ?  Years: 22.00  ?  Pack years: 22.00  ?  Types: Cigarettes  ? Smokeless tobacco: Never  ?Vaping Use  ? Vaping Use: Never used  ?Substance and Sexual Activity  ? Alcohol use: Yes  ?  Comment: occ  ? Drug use: Never  ? Sexual activity: Yes  ?  Birth control/protection: Surgical  ?Other Topics Concern  ? Not on file  ?Social History Narrative  ? Not on file  ? ?Social  Determinants of Health  ? ?Financial Resource Strain: Not on file  ?Food Insecurity: Food Insecurity Present  ? Worried About Charity fundraiser in the Last Year: Sometimes true  ? Ran Out of Food in the Last Y

## 2021-10-13 NOTE — Patient Instructions (Signed)
Please return in 12 months for your annual complete physical; please come fasting.  ? ?I will release your lab results to you on your MyChart account with further instructions. You may see the results before I do, but when I review them I will send you a message with my report or have my assistant call you if things need to be discussed. Please reply to my message with any questions. Thank you!  ? ?We will call you with information regarding your referral appointment to cardiology: please discuss your chest pain, palpitations and lightheadedness. If you do not hear from [place] within the next 2 weeks, please let me know. It can take 1-2 weeks to get appointments set up with the specialists.   ? ?If you have any questions or concerns, please don't hesitate to send me a message via MyChart or call the office at [phone]. Thank you for visiting with [place] today! It's our pleasure caring for you.  ? ?Please do these things to maintain good health! ? ?Exercise at least 30-45 minutes a day,  4-5 days a week.  ?Eat a low-fat diet with lots of fruits and vegetables, up to 7-9 servings per day. ?Drink plenty of water daily. Try to drink 8 8oz glasses per day. ?Seatbelts can save your life. Always wear your seatbelt. ?Place Smoke Detectors on every level of your home and check batteries every year. ?Schedule an appointment with an eye doctor for an eye exam every 1-2 years ?Safe sex - use condoms to protect yourself from STDs if you could be exposed to these types of infections. Use birth control if you do not want to become pregnant and are sexually active. ?Avoid heavy alcohol use. If you drink, keep it to less than 2 drinks/day and not every day. ?Health Care Power of Attorney.  Choose someone you trust that could speak for you if you became unable to speak for yourself. ?Depression is common in our stressful world.If you're feeling down or losing interest in things you normally enjoy, please come in for a visit. ?If anyone  is threatening or hurting you, please get help. Physical or Emotional Violence is never OK.   ?

## 2021-10-14 NOTE — Progress Notes (Signed)
?Cardiology Office Note:   ? ?Date:  10/16/2021  ? ?ID:  Deborah Andrews, DOB 12/12/1985, MRN [id number] ? ?PCP:  Willow OraAndy, Camille L, MD  ? ?[facility] Providers ?Cardiologist:  Alverda Skeanshukkani, Caramia Boutin, MD ?Referring MD: Willow OraAndy, Camille L, MD  ? ?Chief Complaint/Reason for Referral: Syncope and palpitations ? ?ASSESSMENT:   ? ?1. Chest pain, unspecified type   ?2. BMI 50.0-59.9, adult (HCC)   ? ? ?PLAN:   ? ?In order of problems listed above: ? We will obtain a coronary CTA to evaluate further.  If the patient has mild obstructive coronary artery disease, they will require a statin (with goal LDL < 70) and aspirin, if they have high-grade disease we will need to consider optimal medical therapy and if symptoms are refractory to medical therapy, then a cardiac catheterization with possible PCI will be pursued to alleviate symptoms.  If they have high risk disease we will proceed directly to cardiac catheterization.  Follow up in 6 months.  Her echocardiogram is reassuring and monitor are both reassuring. ?Continue diet and exercise and consider pharmacotherapy in the future. ? ? ?     ? ?Dispo:  Return in about 6 months (around 04/18/2022).  ? ?  ? ?Medication Adjustments/Labs and Tests Ordered: ?Current medicines are reviewed at length with the patient today.  Concerns regarding medicines are outlined above.  ? ?Tests Ordered: ?No orders of the defined types were placed in this encounter. ? ? ?Medication Changes: ?No orders of the defined types were placed in this encounter. ? ? ?History of Present Illness:   ? ?FOCUSED PROBLEM LIST:   ?1.  BMI 55 ?2.  Tobacco abuse ? ?The patient is a 36 y.o. female with the indicated medical history here for recommendations regarding chest pain.  Patient has been seen by her primary care provider and due to syncope had an echocardiogram which was normal and a monitor which was reassuring.  She reported atypical chest pain occurring at rest and lasting minutes.  She requests a cardiology referral.   She tells me at times she will get chest pain just sitting.  It can last several minutes and then goes away.  It feels like she cannot take a deep breath and that her heart is being trapped.  She occasionally gets fainting spells but tells me this usually happens when she is very hot.  Last time this happened she was at [employer] and had to sit down on the ground to regain her composure.  She denies any significant shortness of breath.  She has had a bit of peripheral edema.  She denies any paroxysmal nocturnal dyspnea, orthopnea.  She is required no emergency room visits or hospitalizations.  Unfortunately she continues to smoke but is intent on quitting. ? ? ? ? ? ?Current Medications: ?Current Meds  ?Medication Sig  ? ALPRAZolam (XANAX) 0.5 MG tablet Take 1 tablet (0.5 mg total) by mouth daily as needed for anxiety.  ? ibuprofen (ADVIL) 600 MG tablet Take 1 tablet (600 mg total) by mouth every 8 (eight) hours as needed.  ? loratadine (ALLERGY) 10 MG tablet Take 10 mg by mouth daily. Pt takes over the counter Aller-itin for allergies.  ? methocarbamol (ROBAXIN) 500 MG tablet Take 1-2 tablets (500-1,000 mg total) by mouth 4 (four) times daily. OK to take with Ibuprofen  ? Moringa Oleifera (MORINGA PO) Take 1,000 mg by mouth daily. Moringa an over the counter multi vitamin supplement.  ? Multiple Vitamins-Minerals (MULTIVITAMIN) tablet Take 1 tablet  by mouth daily.  ? omeprazole (PRILOSEC) 20 MG capsule TAKE 1 CAPSULE(20 MG) BY MOUTH DAILY  ? VENTOLIN HFA 108 (90 Base) MCG/ACT inhaler INHALE 2 PUFFS INTO THE LUNGS EVERY 4 HOURS AS NEEDED FOR WHEEZING OR SHORTNESS OF BREATH  ?  ? ?Allergies:    ?Patient has no known allergies.  ? ?Social History:   ?Social History  ? ?Tobacco Use  ? Smoking status: Every Day  ?  Packs/day: 1.00  ?  Years: 22.00  ?  Pack years: 22.00  ?  Types: Cigarettes  ? Smokeless tobacco: Never  ?Vaping Use  ? Vaping Use: Never used  ?Substance Use Topics  ? Alcohol use: Yes  ?  Comment: occ  ?  Drug use: Never  ?  ? ?Family Hx: ?Family History  ?Problem Relation Age of Onset  ? Diabetes Mother   ? Hypertension Mother   ? Breast cancer Mother   ?     dx before age 26  ? Cancer Mother 1  ?     Gyn. ?cervical ?ovarian   ? Cancer - Other Mother 74  ?     unknown primary. ?kidney  ? Diabetes Father   ? Breast cancer Maternal Uncle 40  ?     female breast cancer  ? Cervical cancer Half-Sister 61  ?     maternal half sister  ? Leukemia Half-Brother 2  ?     maternal half brother  ? Skin cancer Maternal Aunt   ?     x3  ? Melanoma Maternal Grandmother   ? Melanoma Maternal Grandfather   ? Cancer Maternal Grandfather   ?     unknown type  ?  ? ?Review of Systems:   ?Please see the history of present illness.    ?All other systems reviewed and are negative. ?  ? ? ?EKGs/Labs/Other Test Reviewed:   ? ?EKG: December 2022 sinus rhythm ? ?Prior CV studies: ?  ?Monitor 2023 ?The basic rhythm is normal sinus with an average HR of 87 bpm ?No atrial fibrillation or flutter ?No high-grade heart block or pathologic pauses ?There are rare PVC's and rare supraventricular beats with a single 10 beat supraventricular run noted ? ?TTE 2023 ?Ejection fraction of 57% with no significant valvular abnormalities ? ?Imaging studies that I have independently reviewed today: Monitor and echocardiogram ? ?Recent Labs: ?04/13/2021: Hemoglobin 14.7; Platelets 359.0; TSH 3.73  ? ?Recent Lipid Panel ?Lab Results  ?Component Value Date/Time  ? CHOL 170 10/04/2020 10:28 AM  ? TRIG 140.0 10/04/2020 10:28 AM  ? HDL 44.80 10/04/2020 10:28 AM  ? LDLCALC 98 10/04/2020 10:28 AM  ? ? ?Risk Assessment/Calculations:   ? ? ?    ? ?Physical Exam:   ? ?VS:  BP 108/64   Pulse 62   Ht 5' 3.5" (1.613 m)   Wt (!) 312 lb 12.8 oz (141.9 kg)   LMP 09/20/2021 (Exact Date)   SpO2 96%   BMI 54.54 kg/m?    ?Wt Readings from Last 3 Encounters:  ?10/16/21 (!) 312 lb 12.8 oz (141.9 kg)  ?10/13/21 (!) 313 lb (142 kg)  ?10/04/21 (!) 309 lb 2 oz (140.2 kg)  ?  ?GENERAL:   No apparent distress, Aox3, obese ?HEENT:  No carotid bruits, +2 carotid impulses, no scleral icterus ?CAR: RRR no murmurs, gallops, rubs, or thrills ?RES:  Clear to auscultation bilaterally ?ABD:  Soft, nontender, nondistended, positive bowel sounds x 4 ?VASC:  +2 radial pulses, +2 carotid pulses,  palpable pedal pulses ?NEURO:  CN 2-12 grossly intact; motor and sensory grossly intact ?PSYCH:  No active depression or anxiety ?EXT:  No edema, ecchymosis, or cyanosis ? ?Signed, ?Orbie Pyo, MD  ?10/16/2021 10:55 AM    ?[facility] ?[address] [place], [place], [place]  [address] ?Phone: [phone]; Fax: [phone]  ? ?Note:  This document was prepared using Dragon voice recognition software and may include unintentional dictation errors. ?

## 2021-10-16 ENCOUNTER — Encounter: Payer: Self-pay | Admitting: Internal Medicine

## 2021-10-16 ENCOUNTER — Ambulatory Visit (INDEPENDENT_AMBULATORY_CARE_PROVIDER_SITE_OTHER): Payer: Medicaid Other | Admitting: Internal Medicine

## 2021-10-16 ENCOUNTER — Other Ambulatory Visit: Payer: Self-pay

## 2021-10-16 VITALS — BP 108/64 | HR 62 | Ht 63.5 in | Wt 312.8 lb

## 2021-10-16 DIAGNOSIS — R079 Chest pain, unspecified: Secondary | ICD-10-CM | POA: Diagnosis not present

## 2021-10-16 DIAGNOSIS — M9904 Segmental and somatic dysfunction of sacral region: Secondary | ICD-10-CM | POA: Diagnosis not present

## 2021-10-16 DIAGNOSIS — M9903 Segmental and somatic dysfunction of lumbar region: Secondary | ICD-10-CM | POA: Diagnosis not present

## 2021-10-16 DIAGNOSIS — M9905 Segmental and somatic dysfunction of pelvic region: Secondary | ICD-10-CM | POA: Diagnosis not present

## 2021-10-16 DIAGNOSIS — Z6841 Body Mass Index (BMI) 40.0 and over, adult: Secondary | ICD-10-CM | POA: Diagnosis not present

## 2021-10-16 DIAGNOSIS — M5386 Other specified dorsopathies, lumbar region: Secondary | ICD-10-CM | POA: Diagnosis not present

## 2021-10-16 MED ORDER — METOPROLOL TARTRATE 50 MG PO TABS: ORAL_TABLET | ORAL | 0 refills | Status: DC

## 2021-10-16 NOTE — Patient Instructions (Addendum)
Medication Instructions:  ?Your physician recommends that you continue on your current medications as directed. Please refer to the Current Medication list given to you today. ? ?*If you need a refill on your cardiac medications before your next appointment, please call your pharmacy* ? ? ?Lab Work: ?None ? ?If you have labs (blood work) drawn today and your tests are completely normal, you will receive your results only by: ?MyChart Message (if you have MyChart) OR ?A paper copy in the mail ?If you have any lab test that is abnormal or we need to change your treatment, we will call you to review the results. ? ? ?Testing/Procedures: ?Your physician recommends that you have a Coronary CT performed. ? ? ?Follow-Up: ?At [facility], you and your health needs are our priority.  As part of our continuing mission to provide you with exceptional heart care, we have created designated Provider Care Teams.  These Care Teams include your primary Cardiologist (physician) and Advanced Practice Providers (APPs -  Physician Assistants and Nurse Practitioners) who all work together to provide you with the care you need, when you need it. ? ?We recommend signing up for the patient portal called "MyChart".  Sign up information is provided on this After Visit Summary.  MyChart is used to connect with patients for Virtual Visits (Telemedicine).  Patients are able to view lab/test results, encounter notes, upcoming appointments, etc.  Non-urgent messages can be sent to your provider as well.   ?To learn more about what you can do with MyChart, go to [email or web address].   ? ?Your next appointment:   ?6 month(s) ? ?The format for your next appointment:   ?In Person ? ?Provider:   ?Lenna Sciara, MD  ? ? ?Other Instructions ? ? ? ?Your cardiac CT will be scheduled at one of the below locations:  ? ?[facility] ?[address] ?[place], [place] [address] ?[phone] ? ?OR ? ?[place] [address] ?[address] ?[place], [place] [address] ?[phone] ? ?If scheduled at [facility], please arrive at the [facility] and Children's Entrance (Entrance C2) of [facility] 30 minutes prior to test start time. ?You can use the FREE valet parking offered at entrance C (encouraged to control the heart rate for the test)  ?Proceed to the [facility] Radiology Department (first floor) to check-in and test prep. ? ?All radiology patients and guests should use entrance C2 at [facility], accessed from [facility], even though the hospital's physical address listed is [address] ? ? ? ?If scheduled at [facility], please arrive 15 mins early for check-in and test prep. ? ?Please follow these instructions carefully (unless otherwise directed): ? ?Hold all erectile dysfunction medications at least 3 days (72 hrs) prior to test. ? ?On the Night Before the Test: ?Be sure to Drink plenty of water. ?Do not consume any caffeinated/decaffeinated beverages or chocolate 12 hours prior to your test. ?Do not take any antihistamines 12 hours prior to your test. ? ? ?On the Day of the Test: ?Drink plenty of water until 1 hour prior to the test. ?Do not eat any food 4 hours prior to the test. ?You may take your regular medications prior to the test.  ?Take metoprolol (Lopressor) two hours prior to test. ?HOLD Furosemide/Hydrochlorothiazide morning of the test. ?FEMALES- please wear underwire-free bra if available, avoid dresses & tight clothing ? ?     ?After the Test: ?  Drink plenty of water. ?After receiving IV contrast, you may experience a mild flushed feeling. This is normal. ?On occasion, you may experience a mild rash up to 24 hours after the test. This is not dangerous. If this occurs, you can take Benadryl 25 mg and increase your fluid intake. ?If you experience trouble breathing, this can be serious. If it is severe call 911  IMMEDIATELY. If it is mild, please call our office. ?If you take any of these medications: Glipizide/Metformin, Avandament, Glucavance, please do not take 48 hours after completing test unless otherwise instructed. ? ?We will call to schedule your test 2-4 weeks out understanding that some insurance companies will need an authorization prior to the service being performed.  ? ?For non-scheduling related questions, please contact the cardiac imaging nurse navigator should you have any questions/concerns: ?Marchia Bond, Cardiac Imaging Nurse Navigator ?Gordy Clement, Cardiac Imaging Nurse Navigator ?[facility] ?Direct Office Dial: [phone]  ? ?For scheduling needs, including cancellations and rescheduling, please call [place], [phone].   ?

## 2021-10-17 ENCOUNTER — Other Ambulatory Visit: Payer: Self-pay | Admitting: Internal Medicine

## 2021-10-17 LAB — TSH: TSH: 3.63 u[IU]/mL (ref 0.35–5.50)

## 2021-10-25 ENCOUNTER — Telehealth (HOSPITAL_COMMUNITY): Payer: Self-pay | Admitting: Emergency Medicine

## 2021-10-25 NOTE — Telephone Encounter (Signed)
Reaching out to patient to offer assistance regarding upcoming cardiac imaging study; pt verbalizes understanding of appt date/time, parking situation and where to check in, pre-test NPO status and medications ordered, and verified current allergies; name and call back number provided for further questions should they arise ?Rockwell Alexandria RN Navigator Cardiac Imaging ?[facility] ?[phone] office ?[phone] cell ? ?Nervous ?Denies iv issues ?50mg  metoprolol tartrate ?Arrival 1230 ? ?

## 2021-10-27 ENCOUNTER — Ambulatory Visit (HOSPITAL_COMMUNITY)
Admission: RE | Admit: 2021-10-27 | Discharge: 2021-10-27 | Disposition: A | Discharge status: Home / Self Care | Payer: Medicaid Other | Source: Ambulatory Visit | Attending: Internal Medicine | Admitting: Internal Medicine

## 2021-10-27 DIAGNOSIS — R079 Chest pain, unspecified: Secondary | ICD-10-CM | POA: Insufficient documentation

## 2021-10-27 MED ORDER — METOPROLOL TARTRATE 5 MG/5ML IV SOLN
INTRAVENOUS | Status: AC
  Administered 2021-10-27: 5 mg via INTRAVENOUS
  Filled 2021-10-27: qty 20

## 2021-10-27 MED ORDER — NITROGLYCERIN 0.4 MG SL SUBL
SUBLINGUAL_TABLET | SUBLINGUAL | Status: AC
  Administered 2021-10-27: 0.8 mg via SUBLINGUAL
  Filled 2021-10-27: qty 2

## 2021-10-27 MED ORDER — METOPROLOL TARTRATE 5 MG/5ML IV SOLN: 5.0000 mg | INTRAVENOUS | Status: DC | PRN

## 2021-10-27 MED ORDER — DILTIAZEM HCL 25 MG/5ML IV SOLN
INTRAVENOUS | Status: AC
  Administered 2021-10-27: 10 mg via INTRAVENOUS
  Filled 2021-10-27: qty 5

## 2021-10-27 MED ORDER — DILTIAZEM HCL 25 MG/5ML IV SOLN: 10.0000 mg | Freq: Once | INTRAVENOUS | Status: AC

## 2021-10-27 MED ORDER — IOHEXOL 350 MG/ML SOLN
100.0000 mL | Freq: Once | INTRAVENOUS | Status: AC | PRN
  Administered 2021-10-27: 100 mL via INTRAVENOUS

## 2021-10-27 MED ORDER — NITROGLYCERIN 0.4 MG SL SUBL
0.8000 mg | SUBLINGUAL_TABLET | Freq: Once | SUBLINGUAL | Status: AC
Start: 2021-10-27 — End: 2021-10-27

## 2021-10-30 ENCOUNTER — Ambulatory Visit (INDEPENDENT_AMBULATORY_CARE_PROVIDER_SITE_OTHER): Payer: Medicaid Other | Admitting: Obstetrics and Gynecology

## 2021-10-30 ENCOUNTER — Encounter: Payer: Self-pay | Admitting: Obstetrics and Gynecology

## 2021-10-30 VITALS — BP 116/91 | HR 87 | Wt 314.9 lb

## 2021-10-30 DIAGNOSIS — Z01818 Encounter for other preprocedural examination: Secondary | ICD-10-CM

## 2021-10-30 NOTE — Progress Notes (Signed)
Pt seen.  Appointment was originally for a preoperative appointment, but patient made MD aware she would need to reschedule due to child care issues.  Pt would like to reschedule to June 2023 when she should have assistance with child care.  No preoperative exam done today.  Once surgery date is rescheduled, we will reschedule preoperative appointment. ? ?Mariel Aloe, MD ?Faculty attending ?[facility] [employer] ?

## 2021-11-06 ENCOUNTER — Other Ambulatory Visit (HOSPITAL_COMMUNITY): Payer: Medicaid Other

## 2021-12-21 ENCOUNTER — Encounter: Payer: Self-pay | Admitting: Obstetrics and Gynecology

## 2021-12-21 ENCOUNTER — Ambulatory Visit (INDEPENDENT_AMBULATORY_CARE_PROVIDER_SITE_OTHER): Payer: Medicaid Other | Admitting: Obstetrics and Gynecology

## 2021-12-21 ENCOUNTER — Ambulatory Visit: Payer: Medicaid Other | Admitting: Obstetrics and Gynecology

## 2021-12-21 VITALS — BP 123/73 | HR 96 | Ht 63.5 in | Wt 319.3 lb

## 2021-12-21 DIAGNOSIS — Z01818 Encounter for other preprocedural examination: Secondary | ICD-10-CM

## 2021-12-21 NOTE — Progress Notes (Signed)
GYN pt presents for Pre Op consult. No concerns at this time.

## 2021-12-21 NOTE — Progress Notes (Signed)
OB/GYN Pre-Op History and Physical  Deborah Andrews is a 36 y.o. [id number] presenting for preoperative appointment due to [facility] 2/2 menorrhagia.  Risks and benefits of the procedure given including bleeding, infection, involvement of other organs including bladder and bowel.  Risk of conversion to open procedure also discussed due to her obesity and 2 previous cesarean sections.       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    Past Surgical History:  Procedure Laterality Date   CERVICAL CONE BIOPSY     CESAREAN SECTION     x2   CHOLECYSTECTOMY     TONSILLECTOMY     TUBAL LIGATION     WISDOM TOOTH EXTRACTION      OB History  Gravida Para Term Preterm AB Living  2 2   2   2   SAB IAB Ectopic Multiple Live Births          2    # Outcome Date GA Lbr Len/2nd Weight Sex Delivery Anes PTL Lv  2 Preterm 2014    M CS-Unspec   LIV  1 Preterm 2009    F CS-Unspec  Y LIV    Social History   Socioeconomic History   Marital status: Divorced    Spouse name: boyfriend   Number of children: Not on file   Years of education: Not on file   Highest education level: Not on file  Occupational History   Not on file  Tobacco Use   Smoking status: Every Day    Packs/day: 1.00    Years: 22.00    Pack years: 22.00    Types: Cigarettes   Smokeless tobacco: Never  Vaping Use   Vaping Use: Never used  Substance and Sexual Activity   Alcohol use: Yes    Comment: occ   Drug use: Never   Sexual activity: Yes    Birth control/protection: Surgical  Other Topics Concern   Not on file  Social History Narrative   Not on file   Social Determinants of Health   Financial Resource Strain: Not on file  Food Insecurity: Food Insecurity Present   Worried About [place] in the Last Year: Sometimes true   Ran Out  of Food in the Last Year: Sometimes true  Transportation Needs: No Transportation Needs   Lack of Transportation (Medical): No   Lack of Transportation (Non-Medical): No  Physical Activity: Not on file  Stress: Not on file  Social Connections: Not on file    Family History  Problem Relation Age of Onset   Diabetes Mother    Hypertension Mother    Breast cancer Mother        dx before age 56   Cancer Mother 32       Gyn. ?cervical ?ovarian    Cancer - Other Mother 70       unknown primary. ?kidney   Diabetes Father    Breast cancer Maternal Uncle 66       female breast cancer   Cervical cancer Half-Sister 36       maternal half sister   Leukemia Half-Brother 2       maternal half brother   Skin cancer Maternal Aunt  x3   Melanoma Maternal Grandmother    Melanoma Maternal Grandfather    Cancer Maternal Grandfather        unknown type    (Not in a hospital admission)   No Known Allergies  Review of Systems: Negative except for what is mentioned in HPI.     Physical Exam: BP 123/73   Pulse 96   Ht 5' 3.5" (1.613 m)   Wt (!) 319 lb 4.8 oz (144.8 kg)   LMP 12/14/2021   BMI 55.67 kg/m  CONSTITUTIONAL: Well-developed,obese, well-nourished female in no acute distress.  HENT:  Normocephalic, atraumatic, External right and left ear normal. Oropharynx is clear and moist EYES: Conjunctivae and EOM are normal.  NECK: Normal range of motion, supple, no masses SKIN: Skin is warm and dry. No rash noted. Not diaphoretic. No erythema. No pallor. [place]: Alert and oriented to person, place, and time. Normal reflexes, muscle tone coordination. No cranial nerve deficit noted. PSYCHIATRIC: Normal mood and affect. Normal behavior. Normal judgment and thought content. CARDIOVASCULAR: Normal heart rate noted, regular rhythm RESPIRATORY: Effort and breath sounds normal, no problems with respiration noted ABDOMEN: Soft, nontender, nondistended, gravid. Well-healed Pfannenstiel  incision. PELVIC: Deferred, previously examined by myself and Dr. Rip Harbour MUSCULOSKELETAL: Normal range of motion. No edema and no tenderness. 2+ distal pulses.   Pertinent Labs/Studies:   No results found for this or any previous visit (from the past 72 hour(s)).     Assessment and Plan :Deborah Andrews is a 36 y.o. [phone] here for preoperative exam.   Plan for [facility].  Pt is aware if we cannot complete the procedure vaginally, it may need to be converted to an abdominal approach NPO Admission labs ordered VS Q4    Lynnda Shields, M.D. Attending [place], [facility] for [employer], [place]

## 2021-12-21 NOTE — H&P (View-Only) (Signed)
OB/GYN Pre-Op History and Physical  Deborah Andrews is a 36 y.o. [id number] presenting for preoperative appointment due to [facility] 2/2 menorrhagia.  Risks and benefits of the procedure given including bleeding, infection, involvement of other organs including bladder and bowel.  Risk of conversion to open procedure also discussed due to her obesity and 2 previous cesarean sections.       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    Past Surgical History:  Procedure Laterality Date   CERVICAL CONE BIOPSY     CESAREAN SECTION     x2   CHOLECYSTECTOMY     TONSILLECTOMY     TUBAL LIGATION     WISDOM TOOTH EXTRACTION      OB History  Gravida Para Term Preterm AB Living  2 2   2   2   SAB IAB Ectopic Multiple Live Births          2    # Outcome Date GA Lbr Len/2nd Weight Sex Delivery Anes PTL Lv  2 Preterm 2014    M CS-Unspec   LIV  1 Preterm 2009    F CS-Unspec  Y LIV    Social History   Socioeconomic History   Marital status: Divorced    Spouse name: boyfriend   Number of children: Not on file   Years of education: Not on file   Highest education level: Not on file  Occupational History   Not on file  Tobacco Use   Smoking status: Every Day    Packs/day: 1.00    Years: 22.00    Pack years: 22.00    Types: Cigarettes   Smokeless tobacco: Never  Vaping Use   Vaping Use: Never used  Substance and Sexual Activity   Alcohol use: Yes    Comment: occ   Drug use: Never   Sexual activity: Yes    Birth control/protection: Surgical  Other Topics Concern   Not on file  Social History Narrative   Not on file   Social Determinants of Health   Financial Resource Strain: Not on file  Food Insecurity: Food Insecurity Present   Worried About [place] in the Last Year: Sometimes true   Ran Out  of Food in the Last Year: Sometimes true  Transportation Needs: No Transportation Needs   Lack of Transportation (Medical): No   Lack of Transportation (Non-Medical): No  Physical Activity: Not on file  Stress: Not on file  Social Connections: Not on file    Family History  Problem Relation Age of Onset   Diabetes Mother    Hypertension Mother    Breast cancer Mother        dx before age 6   Cancer Mother 57       Gyn. ?cervical ?ovarian    Cancer - Other Mother 90       unknown primary. ?kidney   Diabetes Father    Breast cancer Maternal Uncle 15       female breast cancer   Cervical cancer Half-Sister 65       maternal half sister   Leukemia Half-Brother 2       maternal half brother   Skin cancer Maternal Aunt  x3   Melanoma Maternal Grandmother    Melanoma Maternal Grandfather    Cancer Maternal Grandfather        unknown type    (Not in a hospital admission)   No Known Allergies  Review of Systems: Negative except for what is mentioned in HPI.     Physical Exam: BP 123/73   Pulse 96   Ht 5' 3.5" (1.613 m)   Wt (!) 319 lb 4.8 oz (144.8 kg)   LMP 12/14/2021   BMI 55.67 kg/m  CONSTITUTIONAL: Well-developed,obese, well-nourished female in no acute distress.  HENT:  Normocephalic, atraumatic, External right and left ear normal. Oropharynx is clear and moist EYES: Conjunctivae and EOM are normal.  NECK: Normal range of motion, supple, no masses SKIN: Skin is warm and dry. No rash noted. Not diaphoretic. No erythema. No pallor. [place]: Alert and oriented to person, place, and time. Normal reflexes, muscle tone coordination. No cranial nerve deficit noted. PSYCHIATRIC: Normal mood and affect. Normal behavior. Normal judgment and thought content. CARDIOVASCULAR: Normal heart rate noted, regular rhythm RESPIRATORY: Effort and breath sounds normal, no problems with respiration noted ABDOMEN: Soft, nontender, nondistended, gravid. Well-healed Pfannenstiel  incision. PELVIC: Deferred, previously examined by myself and Dr. Rip Harbour MUSCULOSKELETAL: Normal range of motion. No edema and no tenderness. 2+ distal pulses.   Pertinent Labs/Studies:   No results found for this or any previous visit (from the past 72 hour(s)).     Assessment and Plan :Deborah Andrews is a 36 y.o. [phone] here for preoperative exam.   Plan for [facility].  Pt is aware if we cannot complete the procedure vaginally, it may need to be converted to an abdominal approach NPO Admission labs ordered VS Q4    Lynnda Shields, M.D. Attending [place], [facility] for [employer], [place]

## 2021-12-25 NOTE — Pre-Procedure Instructions (Signed)
Surgical Instructions    Your procedure is scheduled on Tuesday, 01/02/2022.  Report to [facility] Main Entrance "A" at 0750 A.M., then check in with the Admitting office.  Call this number if you have problems the morning of surgery:  [phone]   If you have any questions prior to your surgery date call [phone]: Open Monday-Friday 8am-4pm    Remember:  Do not eat or drink after midnight the night before your surgery     Take these medicines the morning of surgery with A SIP OF WATER:   NONE   As of today, STOP taking any Aspirin (unless otherwise instructed by your surgeon) Aleve, Naproxen, Ibuprofen, Motrin, Advil, Goody's, BC's, all herbal medications, fish oil, and all vitamins.           Do not wear jewelry or makeup Do not wear lotions, powders, perfumes/colognes, or deodorant. Do not shave 48 hours prior to surgery.  Men may shave face and neck. Do not bring valuables to the hospital. Do not wear nail polish, gel polish, artificial nails, or any other type of covering on natural nails (fingers and toes) If you have artificial nails or gel coating that need to be removed by a nail salon, please have this removed prior to surgery. Artificial nails or gel coating may interfere with anesthesia's ability to adequately monitor your vital signs.  [facility] is not responsible for any belongings or valuables. .   Do NOT Smoke (Tobacco/Vaping)  24 hours prior to your procedure  If you use a CPAP at night, you may bring your mask for your overnight stay.   Contacts, glasses, hearing aids, dentures or partials may not be worn into surgery, please bring cases for these belongings   For patients admitted to the hospital, discharge time will be determined by your treatment team.   Patients discharged the day of surgery will not be allowed to drive home, and someone needs to stay with them for 24 hours.   SURGICAL WAITING ROOM VISITATION Patients having surgery or a  procedure in a hospital may have two support people. Children under the age of 10 must have an adult with them who is not the patient. They may stay in the waiting area during the procedure and may switch out with other visitors. If the patient needs to stay at the hospital during part of their recovery, the visitor guidelines for inpatient rooms apply.  Please refer to the [facility] website for the visitor guidelines for Inpatients (after your surgery is over and you are in a regular room).       Special instructions:    Oral Hygiene is also important to reduce your risk of infection.  Remember - BRUSH YOUR TEETH THE MORNING OF SURGERY WITH YOUR REGULAR TOOTHPASTE   [facility]- Preparing For Surgery  Before surgery, you can play an important role. Because skin is not sterile, your skin needs to be as free of germs as possible. You can reduce the number of germs on your skin by washing with CHG (chlorahexidine gluconate) Soap before surgery.  CHG is an antiseptic cleaner which kills germs and bonds with the skin to continue killing germs even after washing.     Please do not use if you have an allergy to CHG or antibacterial soaps. If your skin becomes reddened/irritated stop using the CHG.  Do not shave (including legs and underarms) for at least 48 hours prior to first CHG shower. It is OK to shave your face.  Please follow these instructions carefully.     Shower the NIGHT BEFORE SURGERY and the MORNING OF SURGERY with CHG Soap.   If you chose to wash your hair, wash your hair first as usual with your normal shampoo. After you shampoo, rinse your hair and body thoroughly to remove the shampoo.  Then [employer] and genitals (private parts) with your normal soap and rinse thoroughly to remove soap.  After that Use CHG Soap as you would any other liquid soap. You can apply CHG directly to the skin and wash gently with a scrungie or a clean washcloth.   Apply the CHG Soap to your body  ONLY FROM THE NECK DOWN.  Do not use on open wounds or open sores. Avoid contact with your eyes, ears, mouth and genitals (private parts). Wash Face and genitals (private parts)  with your normal soap.   Wash thoroughly, paying special attention to the area where your surgery will be performed.  Thoroughly rinse your body with warm water from the neck down.  DO NOT shower/wash with your normal soap after using and rinsing off the CHG Soap.  Pat yourself dry with a CLEAN TOWEL.  Wear CLEAN PAJAMAS to bed the night before surgery  Place CLEAN SHEETS on your bed the night before your surgery  DO NOT SLEEP WITH PETS.   Day of Surgery:  Take a shower with CHG soap. Wear Clean/Comfortable clothing the morning of surgery Do not apply any deodorants/lotions.   Remember to brush your teeth WITH YOUR REGULAR TOOTHPASTE.    If you received a COVID test during your pre-op visit, it is requested that you wear a mask when out in public, stay away from anyone that may not be feeling well, and notify your surgeon if you develop symptoms. If you have been in contact with anyone that has tested positive in the last 10 days, please notify your surgeon.    Please read over the following fact sheets that you were given.

## 2021-12-27 ENCOUNTER — Encounter (HOSPITAL_COMMUNITY)
Admission: RE | Admit: 2021-12-27 | Discharge: 2021-12-27 | Disposition: A | Discharge status: Home / Self Care | Payer: Medicaid Other | Source: Ambulatory Visit | Attending: Obstetrics and Gynecology | Admitting: Obstetrics and Gynecology

## 2021-12-27 ENCOUNTER — Other Ambulatory Visit: Payer: Self-pay

## 2021-12-27 ENCOUNTER — Encounter (HOSPITAL_COMMUNITY): Payer: Self-pay

## 2021-12-27 VITALS — BP 132/89 | HR 97 | Temp 98.1°F | Resp 18 | Ht 63.5 in | Wt 318.7 lb

## 2021-12-27 DIAGNOSIS — Z01812 Encounter for preprocedural laboratory examination: Secondary | ICD-10-CM | POA: Insufficient documentation

## 2021-12-27 DIAGNOSIS — R0789 Other chest pain: Secondary | ICD-10-CM | POA: Diagnosis not present

## 2021-12-27 DIAGNOSIS — R55 Syncope and collapse: Secondary | ICD-10-CM | POA: Insufficient documentation

## 2021-12-27 DIAGNOSIS — Z01818 Encounter for other preprocedural examination: Secondary | ICD-10-CM

## 2021-12-27 HISTORY — DX: Other chest pain: R07.89

## 2021-12-27 HISTORY — DX: Attention-deficit hyperactivity disorder, unspecified type: F90.9

## 2021-12-27 HISTORY — DX: Gastro-esophageal reflux disease without esophagitis: K21.9

## 2021-12-27 LAB — COMPREHENSIVE METABOLIC PANEL
ALT: 21 U/L (ref 0–44)
AST: 24 U/L (ref 15–41)
Albumin: 3.4 g/dL — ABNORMAL LOW (ref 3.5–5.0)
Alkaline Phosphatase: 58 U/L (ref 38–126)
Anion gap: 9 (ref 5–15)
BUN: <5 mg/dL — ABNORMAL LOW (ref 6–20)
CO2: 22 mmol/L (ref 22–32)
Calcium: 9.5 mg/dL (ref 8.9–10.3)
Chloride: 105 mmol/L (ref 98–111)
Creatinine, Ser: 0.84 mg/dL (ref 0.44–1.00)
GFR, Estimated: >60 mL/min (ref >60)
Glucose, Bld: 106 mg/dL — ABNORMAL HIGH (ref 70–99)
Potassium: 4.2 mmol/L (ref 3.5–5.1)
Sodium: 136 mmol/L (ref 135–145)
Total Bilirubin: 0.7 mg/dL (ref 0.3–1.2)
Total Protein: 7.3 g/dL (ref 6.5–8.1)

## 2021-12-27 LAB — CBC
HCT: 45.2 % (ref 36.0–46.0)
Hemoglobin: 15.3 g/dL — ABNORMAL HIGH (ref 12.0–15.0)
MCH: 30.7 pg (ref 26.0–34.0)
MCHC: 33.8 g/dL (ref 30.0–36.0)
MCV: 90.6 fL (ref 80.0–100.0)
Platelets: 419 10*3/uL — ABNORMAL HIGH (ref 150–400)
RBC: 4.99 MIL/uL (ref 3.87–5.11)
RDW: 13.2 % (ref 11.5–15.5)
WBC: 15.2 10*3/uL — ABNORMAL HIGH (ref 4.0–10.5)
nRBC: 0 % (ref 0.0–0.2)

## 2021-12-27 LAB — TYPE AND SCREEN
ABO/RH(D): B POS
Antibody Screen: NEGATIVE

## 2021-12-27 NOTE — Pre-Procedure Instructions (Signed)
Surgical Instructions    Your procedure is scheduled on Tuesday, 01/02/2022.  Report to [facility] Main Entrance "A" at 0750 A.M., then check in with the Admitting office.  Call this number if you have problems the morning of surgery:  [phone]   If you have any questions prior to your surgery date call [phone]: Open Monday-Friday 8am-4pm    Remember:  Do not eat or drink after midnight the night before your surgery     Take these medicines the morning of surgery with A SIP OF WATER:   omeprazole (PRILOSEC)  loratadine (CLARITIN) if needed (nothing with pseudoephedrine in it) VENTOLIN HFA 108 (90 Base) MCG/ACT inhaler if needed- please bring your inhaler with you to the hospital ALPRAZolam Prudy Feeler)  if needed methocarbamol (ROBAXIN) if needed   As of today, STOP taking any Aspirin (unless otherwise instructed by your surgeon) Aleve, Naproxen, Ibuprofen, Motrin, Advil, Goody's, BC's, all herbal medications, fish oil, and all vitamins.           Do not wear jewelry or makeup Do not wear lotions, powders, perfumes/colognes, or deodorant. Do not shave 48 hours prior to surgery.  Men may shave face and neck. Do not bring valuables to the hospital. Do not wear nail polish, gel polish, artificial nails, or any other type of covering on natural nails (fingers and toes) If you have artificial nails or gel coating that need to be removed by a nail salon, please have this removed prior to surgery. Artificial nails or gel coating may interfere with anesthesia's ability to adequately monitor your vital signs.  [facility] is not responsible for any belongings or valuables. .   Do NOT Smoke (Tobacco/Vaping)  24 hours prior to your procedure  If you use a CPAP at night, you may bring your mask for your overnight stay.   Contacts, glasses, hearing aids, dentures or partials may not be worn into surgery, please bring cases for these belongings   For patients admitted to the hospital,  discharge time will be determined by your treatment team.   Patients discharged the day of surgery will not be allowed to drive home, and someone needs to stay with them for 24 hours.   SURGICAL WAITING ROOM VISITATION Patients having surgery or a procedure in a hospital may have two support people. Children under the age of 66 must have an adult with them who is not the patient. They may stay in the waiting area during the procedure and may switch out with other visitors. If the patient needs to stay at the hospital during part of their recovery, the visitor guidelines for inpatient rooms apply.  Please refer to the [facility] website for the visitor guidelines for Inpatients (after your surgery is over and you are in a regular room).       Special instructions:    Oral Hygiene is also important to reduce your risk of infection.  Remember - BRUSH YOUR TEETH THE MORNING OF SURGERY WITH YOUR REGULAR TOOTHPASTE   [facility]- Preparing For Surgery  Before surgery, you can play an important role. Because skin is not sterile, your skin needs to be as free of germs as possible. You can reduce the number of germs on your skin by washing with CHG (chlorahexidine gluconate) Soap before surgery.  CHG is an antiseptic cleaner which kills germs and bonds with the skin to continue killing germs even after washing.     Please do not use if you have an allergy to  CHG or antibacterial soaps. If your skin becomes reddened/irritated stop using the CHG.  Do not shave (including legs and underarms) for at least 48 hours prior to first CHG shower. It is OK to shave your face.  Please follow these instructions carefully.     Shower the NIGHT BEFORE SURGERY and the MORNING OF SURGERY with CHG Soap.   If you chose to wash your hair, wash your hair first as usual with your normal shampoo. After you shampoo, rinse your hair and body thoroughly to remove the shampoo.  Then [employer] and genitals (private  parts) with your normal soap and rinse thoroughly to remove soap.  After that Use CHG Soap as you would any other liquid soap. You can apply CHG directly to the skin and wash gently with a scrungie or a clean washcloth.   Apply the CHG Soap to your body ONLY FROM THE NECK DOWN.  Do not use on open wounds or open sores. Avoid contact with your eyes, ears, mouth and genitals (private parts). Wash Face and genitals (private parts)  with your normal soap.   Wash thoroughly, paying special attention to the area where your surgery will be performed.  Thoroughly rinse your body with warm water from the neck down.  DO NOT shower/wash with your normal soap after using and rinsing off the CHG Soap.  Pat yourself dry with a CLEAN TOWEL.  Wear CLEAN PAJAMAS to bed the night before surgery  Place CLEAN SHEETS on your bed the night before your surgery  DO NOT SLEEP WITH PETS.   Day of Surgery:  Take a shower with CHG soap. Wear Clean/Comfortable clothing the morning of surgery Do not apply any deodorants/lotions.   Remember to brush your teeth WITH YOUR REGULAR TOOTHPASTE.    If you received a COVID test during your pre-op visit, it is requested that you wear a mask when out in public, stay away from anyone that may not be feeling well, and notify your surgeon if you develop symptoms. If you have been in contact with anyone that has tested positive in the last 10 days, please notify your surgeon.    Please read over the following fact sheets that you were given.

## 2021-12-27 NOTE — Progress Notes (Addendum)
PCP - Billey Chang Cardiologist - Dr Ali Lowe  PPM/ICD - denies Chest x-ray - N/A EKG - 07/12/21 Stress Test - denies ECHO - 08/03/21 Cardiac Cath - denies Coronary CT- 10/27/21  Sleep Study - pt reports sleep study done "somewhere in [place]". Pt states she cannot wear a CPAP   COVID TEST- N/A  Patient states she is still smoking 1 pack of cigarettes per day. Pt educated not to smoke for 24 hours prior to surgery.   Anesthesia review: Pt reports daily chest pain- "feels like box pressing in" inside chest. Pt states she has no shortness of breath with this, but has passed out when she gets "too hot" or "walks to much". Pt reports that the cardiologist told her not to do those things. Pt states she has a history of "day apnea" that she stops breathing when she is awake. Pt reports this happens when she is awake and is too cold or too hot. Pt reports this last happened when she was 20-21, but she has avoided situations that cause this since. Pt reports history of heart murmur as a child and states that she has "always had heart issues" since she was a child. Pt reports a doctor in [place] told her to avoid diet pepsi because of ingredients that could cause more heart issues.  Deborah Caldwell, PA notified of patient at PAT appointment.   WBC 15.2 12/27/21  Patient denies shortness of breath, fever, cough and chest pain at PAT appointment. Patient states she feels well today.    All instructions explained to the patient, with a verbal understanding of the material. Patient agrees to go over the instructions while at home for a better understanding. The opportunity to ask questions was provided.

## 2021-12-28 NOTE — Progress Notes (Signed)
Anesthesia Chart Review:  Recent cardiology evaluation for atypical chest pain and syncope (possibly psychogenic per PCP notes).  She was seen by Dr. Lynnette Caffey on 10/16/2021.  He noted the patient had recently been evaluated by her PCP with normal echo and reassuring event monitor.  Patient requested cardiology eval.  Dr. Lynnette Caffey recommended coronary CTA for further evaluation.  Scan showed calcium score of 0, normal coronaries.  Recommended she follow-up with PCP for noncardiac chest pain.  Preop labs reviewed, WBC mildly elevated at 15.2, otherwise unremarkable.  EKG 07/12/2021: NSR.  Rate 79.  CT coronary morphology 10/27/2021: IMPRESSION: 1. Coronary calcium score of 0.   2. Normal coronary origin with right dominance.   3. Normal coronary arteries.   RECOMMENDATIONS: 1. No evidence   TTE 08/03/2021:  1. Left ventricular ejection fraction by 3D volume is 57 %. The left  ventricle has normal function. The left ventricle has no regional wall  motion abnormalities. Left ventricular diastolic parameters were normal.   2. Right ventricular systolic function is normal. The right ventricular  size is normal.   3. The mitral valve is normal in structure. Trivial mitral valve  regurgitation. No evidence of mitral stenosis.   4. The aortic valve is normal in structure. Aortic valve regurgitation is  not visualized. No aortic stenosis is present.   5. The inferior vena cava is normal in size with greater than 50%  respiratory variability, suggesting right atrial pressure of 3 mmHg.   Conclusion(s)/Recommendation(s): Normal biventricular function without  evidence of hemodynamically significant valvular heart disease.   Event monitor 07/26/2021: SUMMARY: The basic rhythm is normal sinus with an average HR of 87 bpm No atrial fibrillation or flutter No high-grade heart block or pathologic pauses There are rare PVC's and rare supraventricular beats with a single 10 beat supraventricular run  noted     Zannie Cove [facility] Stay [facility]/Anesthesiology Phone [phone] 12/28/2021 12:28 PM

## 2021-12-28 NOTE — Anesthesia Preprocedure Evaluation (Signed)
Anesthesia Evaluation  Patient identified by MRN, date of birth, ID band Patient awake    Reviewed: Allergy & Precautions, NPO status , Patient's Chart, lab work & pertinent test results  Airway Mallampati: III  TM Distance: >3 FB Neck ROM: Full    Dental  (+) Edentulous Upper, Missing, Dental Advisory Given, Poor Dentition   Pulmonary asthma , sleep apnea (non compliant w/ CPAP) , Current Smoker,  1ppd x 23 years Albuterol rescue inhaler once a year    Pulmonary exam normal breath sounds clear to auscultation       Cardiovascular Normal cardiovascular exam Rhythm:Regular Rate:Normal  Recent cardiology evaluation for atypical chest pain and syncope (possibly psychogenic per PCP notes).  She was seen by Dr. Lynnette Caffey on 10/16/2021.  He noted the patient had recently been evaluated by her PCP with normal echo and reassuring event monitor.  Patient requested cardiology eval.  Dr. Lynnette Caffey recommended coronary CTA for further evaluation.  Scan showed calcium score of 0, normal coronaries.  Recommended she follow-up with PCP for noncardiac chest pain.  Preop labs reviewed, WBC mildly elevated at 15.2, otherwise unremarkable.  EKG 07/12/2021: NSR.  Rate 79.  CT coronary morphology 10/27/2021: IMPRESSION: 1. Coronary calcium score of 0.  2. Normal coronary origin with right dominance.  3. Normal coronary arteries.  RECOMMENDATIONS: 1. No evidence   TTE 08/03/2021: 1. Left ventricular ejection fraction by 3D volume is 57 %. The left  ventricle has normal function. The left ventricle has no regional wall  motion abnormalities. Left ventricular diastolic parameters were normal.  2. Right ventricular systolic function is normal. The right ventricular  size is normal.  3. The mitral valve is normal in structure. Trivial mitral valve  regurgitation. No evidence of mitral stenosis.  4. The aortic valve is normal in structure. Aortic  valve regurgitation is  not visualized. No aortic stenosis is present.  5. The inferior vena cava is normal in size with greater than 50%  respiratory variability, suggesting right atrial pressure of 3 mmHg.   Conclusion(s)/Recommendation(s): Normal biventricular function without  evidence of hemodynamically significant valvular heart disease.   Event monitor 07/26/2021: SUMMARY: 1. The basic rhythm is normal sinus with an average HR of 87 bpm 2. No atrial fibrillation or flutter 3. No high-grade heart block or pathologic pauses 4. There are rare PVC's and rare supraventricular beats with a single 10 beat supraventricular run noted   Neuro/Psych PSYCHIATRIC DISORDERS Anxiety Depression Panic attacks negative neurological ROS     GI/Hepatic Neg liver ROS, GERD  Controlled,  Endo/Other  Morbid obesityBMI 55  Renal/GU negative Renal ROS  negative genitourinary   Musculoskeletal  (+) Arthritis , Osteoarthritis,    Abdominal (+) + obese,   Peds negative pediatric ROS (+)  Hematology negative hematology ROS (+) Hb 15.3, plt 419   Anesthesia Other Findings   Reproductive/Obstetrics negative OB ROS ? History of needing resuscitation during both c sections at 2 different institutions- pt doesn't seem to know what the cause was, does not have records w/ her                           Anesthesia Physical Anesthesia Plan  ASA: 3  Anesthesia Plan: General   Post-op Pain Management: Tylenol PO (pre-op)* and Toradol IV (intra-op)*   Induction: Intravenous  PONV Risk Score and Plan: 2 and Ondansetron, Dexamethasone, Midazolam and Treatment may vary due to age or medical condition  Airway Management Planned:  Oral ETT  Additional Equipment: None  Intra-op Plan:   Post-operative Plan: Extubation in OR  Informed Consent: I have reviewed the patients History and Physical, chart, labs and discussed the procedure including the risks, benefits and  alternatives for the proposed anesthesia with the patient or authorized representative who has indicated his/her understanding and acceptance.     Dental advisory given  Plan Discussed with: CRNA  Anesthesia Plan Comments: (  )       Anesthesia Quick Evaluation

## 2022-01-02 ENCOUNTER — Ambulatory Visit (HOSPITAL_BASED_OUTPATIENT_CLINIC_OR_DEPARTMENT_OTHER): Payer: Medicaid Other | Admitting: Certified Registered Nurse Anesthetist

## 2022-01-02 ENCOUNTER — Encounter (HOSPITAL_COMMUNITY): Payer: Self-pay | Admitting: Obstetrics and Gynecology

## 2022-01-02 ENCOUNTER — Ambulatory Visit (HOSPITAL_COMMUNITY): Payer: Medicaid Other | Admitting: Physician Assistant

## 2022-01-02 ENCOUNTER — Observation Stay (HOSPITAL_COMMUNITY)
Admission: RE | Admit: 2022-01-02 | Discharge: 2022-01-03 | Disposition: A | Discharge status: Home / Self Care | Payer: Medicaid Other | Source: Ambulatory Visit | Attending: Obstetrics and Gynecology | Admitting: Obstetrics and Gynecology

## 2022-01-02 ENCOUNTER — Other Ambulatory Visit: Payer: Self-pay

## 2022-01-02 ENCOUNTER — Encounter (HOSPITAL_COMMUNITY)
Admission: RE | Disposition: A | Discharge status: Home / Self Care | Payer: Self-pay | Source: Ambulatory Visit | Attending: Obstetrics and Gynecology

## 2022-01-02 DIAGNOSIS — F418 Other specified anxiety disorders: Secondary | ICD-10-CM

## 2022-01-02 DIAGNOSIS — N920 Excessive and frequent menstruation with regular cycle: Secondary | ICD-10-CM

## 2022-01-02 DIAGNOSIS — N888 Other specified noninflammatory disorders of cervix uteri: Secondary | ICD-10-CM | POA: Diagnosis not present

## 2022-01-02 DIAGNOSIS — J45909 Unspecified asthma, uncomplicated: Secondary | ICD-10-CM | POA: Diagnosis not present

## 2022-01-02 DIAGNOSIS — F17210 Nicotine dependence, cigarettes, uncomplicated: Secondary | ICD-10-CM | POA: Diagnosis not present

## 2022-01-02 DIAGNOSIS — N8003 Adenomyosis of the uterus: Principal | ICD-10-CM | POA: Insufficient documentation

## 2022-01-02 DIAGNOSIS — Z01818 Encounter for other preprocedural examination: Secondary | ICD-10-CM

## 2022-01-02 DIAGNOSIS — G4730 Sleep apnea, unspecified: Secondary | ICD-10-CM | POA: Diagnosis not present

## 2022-01-02 DIAGNOSIS — Z90710 Acquired absence of both cervix and uterus: Principal | ICD-10-CM | POA: Diagnosis present

## 2022-01-02 DIAGNOSIS — Z6841 Body Mass Index (BMI) 40.0 and over, adult: Secondary | ICD-10-CM

## 2022-01-02 HISTORY — PX: VAGINAL HYSTERECTOMY: SHX2639

## 2022-01-02 HISTORY — DX: Other complications of anesthesia, initial encounter: T88.59XA

## 2022-01-02 LAB — POCT PREGNANCY, URINE: Preg Test, Ur: NEGATIVE

## 2022-01-02 LAB — ABO/RH: ABO/RH(D): B POS

## 2022-01-02 SURGERY — HYSTERECTOMY, VAGINAL
Anesthesia: General | Site: Vagina | Laterality: Bilateral

## 2022-01-02 MED ORDER — ALBUTEROL SULFATE HFA 108 (90 BASE) MCG/ACT IN AERS
INHALATION_SPRAY | RESPIRATORY_TRACT | Status: DC | PRN
  Administered 2022-01-02 (×2): 6 via RESPIRATORY_TRACT

## 2022-01-02 MED ORDER — 0.9 % SODIUM CHLORIDE (POUR BTL) OPTIME
TOPICAL | Status: DC | PRN
  Administered 2022-01-02: 1000 mL

## 2022-01-02 MED ORDER — ROCURONIUM BROMIDE 10 MG/ML (PF) SYRINGE
PREFILLED_SYRINGE | INTRAVENOUS | Status: AC
  Filled 2022-01-02: qty 10

## 2022-01-02 MED ORDER — PANTOPRAZOLE SODIUM 20 MG PO TBEC
20.0000 mg | DELAYED_RELEASE_TABLET | Freq: Every day | ORAL | Status: DC
  Administered 2022-01-02: 20 mg via ORAL
  Filled 2022-01-02: qty 1

## 2022-01-02 MED ORDER — MEPERIDINE HCL 25 MG/ML IJ SOLN: 6.2500 mg | INTRAMUSCULAR | Status: DC | PRN

## 2022-01-02 MED ORDER — LACTATED RINGERS IV SOLN: INTRAVENOUS | Status: DC

## 2022-01-02 MED ORDER — OXYCODONE HCL 5 MG/5ML PO SOLN: 5.0000 mg | Freq: Once | ORAL | Status: DC | PRN

## 2022-01-02 MED ORDER — ORAL CARE MOUTH RINSE: 15.0000 mL | Freq: Once | OROMUCOSAL | Status: AC

## 2022-01-02 MED ORDER — SODIUM CHLORIDE 0.9% FLUSH: 9.0000 mL | INTRAVENOUS | Status: DC | PRN

## 2022-01-02 MED ORDER — IBUPROFEN 600 MG PO TABS
600.0000 mg | ORAL_TABLET | Freq: Four times a day (QID) | ORAL | Status: DC
  Filled 2022-01-02: qty 1

## 2022-01-02 MED ORDER — CHLORHEXIDINE GLUCONATE 0.12 % MT SOLN
15.0000 mL | Freq: Once | OROMUCOSAL | Status: AC
  Administered 2022-01-02: 15 mL via OROMUCOSAL
  Filled 2022-01-02: qty 15

## 2022-01-02 MED ORDER — PROPOFOL 10 MG/ML IV BOLUS
INTRAVENOUS | Status: DC | PRN
  Administered 2022-01-02: 300 mg via INTRAVENOUS

## 2022-01-02 MED ORDER — MORPHINE SULFATE 1 MG/ML IV SOLN PCA
INTRAVENOUS | Status: DC
  Filled 2022-01-02 (×2): qty 30

## 2022-01-02 MED ORDER — SIMETHICONE 80 MG PO CHEW: 80.0000 mg | CHEWABLE_TABLET | Freq: Four times a day (QID) | ORAL | Status: DC | PRN

## 2022-01-02 MED ORDER — OXYCODONE HCL 5 MG PO TABS
5.0000 mg | ORAL_TABLET | ORAL | Status: DC | PRN
  Administered 2022-01-02: 5 mg via ORAL
  Filled 2022-01-02: qty 1

## 2022-01-02 MED ORDER — ACETAMINOPHEN 500 MG PO TABS
1000.0000 mg | ORAL_TABLET | Freq: Once | ORAL | Status: AC
  Administered 2022-01-02: 1000 mg via ORAL
  Filled 2022-01-02: qty 2

## 2022-01-02 MED ORDER — NICOTINE 21 MG/24HR TD PT24
21.0000 mg | MEDICATED_PATCH | Freq: Every day | TRANSDERMAL | Status: DC
Start: 2022-01-02 — End: 2022-01-03
  Administered 2022-01-02: 21 mg via TRANSDERMAL
  Filled 2022-01-02 (×2): qty 1

## 2022-01-02 MED ORDER — FENTANYL CITRATE (PF) 250 MCG/5ML IJ SOLN
INTRAMUSCULAR | Status: AC
  Filled 2022-01-02: qty 5

## 2022-01-02 MED ORDER — PROPOFOL 10 MG/ML IV BOLUS
INTRAVENOUS | Status: AC
  Filled 2022-01-02: qty 20

## 2022-01-02 MED ORDER — CEFAZOLIN IN SODIUM CHLORIDE 3-0.9 GM/100ML-% IV SOLN
3.0000 g | INTRAVENOUS | Status: DC
  Filled 2022-01-02: qty 100

## 2022-01-02 MED ORDER — OXYCODONE HCL 5 MG PO TABS: 5.0000 mg | ORAL_TABLET | Freq: Once | ORAL | Status: DC | PRN

## 2022-01-02 MED ORDER — HYDROMORPHONE HCL 1 MG/ML IJ SOLN: 1.0000 mg | INTRAMUSCULAR | Status: DC | PRN

## 2022-01-02 MED ORDER — KETOROLAC TROMETHAMINE 30 MG/ML IJ SOLN
30.0000 mg | Freq: Four times a day (QID) | INTRAMUSCULAR | Status: DC
  Administered 2022-01-02 – 2022-01-03 (×3): 30 mg via INTRAVENOUS
  Filled 2022-01-02 (×3): qty 1

## 2022-01-02 MED ORDER — HYDROMORPHONE HCL 1 MG/ML IJ SOLN
INTRAMUSCULAR | Status: AC
  Filled 2022-01-02: qty 1

## 2022-01-02 MED ORDER — ENOXAPARIN SODIUM 80 MG/0.8ML IJ SOSY: 70.0000 mg | PREFILLED_SYRINGE | INTRAMUSCULAR | Status: DC

## 2022-01-02 MED ORDER — ONDANSETRON HCL 4 MG/2ML IJ SOLN
INTRAMUSCULAR | Status: DC | PRN
  Administered 2022-01-02: 4 mg via INTRAVENOUS

## 2022-01-02 MED ORDER — KETOROLAC TROMETHAMINE 30 MG/ML IJ SOLN
30.0000 mg | Freq: Once | INTRAMUSCULAR | Status: AC | PRN
  Administered 2022-01-02: 30 mg via INTRAVENOUS

## 2022-01-02 MED ORDER — ONDANSETRON HCL 4 MG/2ML IJ SOLN
INTRAMUSCULAR | Status: AC
  Filled 2022-01-02: qty 2

## 2022-01-02 MED ORDER — DEXAMETHASONE SODIUM PHOSPHATE 10 MG/ML IJ SOLN
INTRAMUSCULAR | Status: AC
  Filled 2022-01-02: qty 1

## 2022-01-02 MED ORDER — FENTANYL CITRATE (PF) 250 MCG/5ML IJ SOLN
INTRAMUSCULAR | Status: DC | PRN
Start: 2022-01-02 — End: 2022-01-02
  Administered 2022-01-02 (×2): 50 ug via INTRAVENOUS
  Administered 2022-01-02: 100 ug via INTRAVENOUS
  Administered 2022-01-02 (×3): 50 ug via INTRAVENOUS

## 2022-01-02 MED ORDER — ONDANSETRON HCL 4 MG PO TABS: 4.0000 mg | ORAL_TABLET | Freq: Four times a day (QID) | ORAL | Status: DC | PRN

## 2022-01-02 MED ORDER — LIDOCAINE 2% (20 MG/ML) 5 ML SYRINGE
INTRAMUSCULAR | Status: AC
Start: 2022-01-02 — End: ?
  Filled 2022-01-02: qty 5

## 2022-01-02 MED ORDER — DEXAMETHASONE SODIUM PHOSPHATE 10 MG/ML IJ SOLN
INTRAMUSCULAR | Status: DC | PRN
  Administered 2022-01-02: 10 mg via INTRAVENOUS

## 2022-01-02 MED ORDER — AMISULPRIDE (ANTIEMETIC) 5 MG/2ML IV SOLN: 10.0000 mg | Freq: Once | INTRAVENOUS | Status: DC | PRN

## 2022-01-02 MED ORDER — ONDANSETRON HCL 4 MG/2ML IJ SOLN: 4.0000 mg | Freq: Four times a day (QID) | INTRAMUSCULAR | Status: DC | PRN

## 2022-01-02 MED ORDER — HYDROMORPHONE HCL 1 MG/ML IJ SOLN
0.2500 mg | INTRAMUSCULAR | Status: DC | PRN
  Administered 2022-01-02 (×4): 0.5 mg via INTRAVENOUS

## 2022-01-02 MED ORDER — NALOXONE HCL 0.4 MG/ML IJ SOLN
0.4000 mg | INTRAMUSCULAR | Status: DC | PRN
Start: 2022-01-02 — End: 2022-01-02

## 2022-01-02 MED ORDER — IBUPROFEN 600 MG PO TABS: 600.0000 mg | ORAL_TABLET | Freq: Four times a day (QID) | ORAL | Status: DC

## 2022-01-02 MED ORDER — POVIDONE-IODINE 10 % EX SWAB
2.0000 "application " | Freq: Once | CUTANEOUS | Status: AC
  Administered 2022-01-02: 2 via TOPICAL

## 2022-01-02 MED ORDER — DIPHENHYDRAMINE HCL 12.5 MG/5ML PO ELIX
12.5000 mg | ORAL_SOLUTION | Freq: Four times a day (QID) | ORAL | Status: DC | PRN
Start: 2022-01-02 — End: 2022-01-02

## 2022-01-02 MED ORDER — ONDANSETRON HCL 4 MG/2ML IJ SOLN
4.0000 mg | Freq: Once | INTRAMUSCULAR | Status: DC | PRN
Start: 2022-01-02 — End: 2022-01-02

## 2022-01-02 MED ORDER — DEXTROSE 5 % IV SOLN
INTRAVENOUS | Status: DC | PRN
  Administered 2022-01-02: 3 g via INTRAVENOUS

## 2022-01-02 MED ORDER — SOD CITRATE-CITRIC ACID 500-334 MG/5ML PO SOLN
30.0000 mL | ORAL | Status: AC
  Administered 2022-01-02: 30 mL via ORAL
  Filled 2022-01-02: qty 30

## 2022-01-02 MED ORDER — DIPHENHYDRAMINE HCL 50 MG/ML IJ SOLN: 12.5000 mg | Freq: Four times a day (QID) | INTRAMUSCULAR | Status: DC | PRN

## 2022-01-02 MED ORDER — MIDAZOLAM HCL 2 MG/2ML IJ SOLN
INTRAMUSCULAR | Status: DC | PRN
  Administered 2022-01-02: 2 mg via INTRAVENOUS

## 2022-01-02 MED ORDER — SUGAMMADEX SODIUM 200 MG/2ML IV SOLN
INTRAVENOUS | Status: DC | PRN
  Administered 2022-01-02: 200 mg via INTRAVENOUS

## 2022-01-02 MED ORDER — DEXMEDETOMIDINE (PRECEDEX) IN NS 20 MCG/5ML (4 MCG/ML) IV SYRINGE
PREFILLED_SYRINGE | INTRAVENOUS | Status: DC | PRN
  Administered 2022-01-02: 8 ug via INTRAVENOUS
  Administered 2022-01-02: 4 ug via INTRAVENOUS
  Administered 2022-01-02: 8 ug via INTRAVENOUS

## 2022-01-02 MED ORDER — KETOROLAC TROMETHAMINE 30 MG/ML IJ SOLN
INTRAMUSCULAR | Status: AC
  Filled 2022-01-02: qty 1

## 2022-01-02 MED ORDER — ACETAMINOPHEN 500 MG PO TABS
1000.0000 mg | ORAL_TABLET | Freq: Four times a day (QID) | ORAL | Status: DC
  Administered 2022-01-02 – 2022-01-03 (×3): 1000 mg via ORAL
  Filled 2022-01-02 (×3): qty 2

## 2022-01-02 MED ORDER — ONDANSETRON HCL 4 MG/2ML IJ SOLN
4.0000 mg | Freq: Four times a day (QID) | INTRAMUSCULAR | Status: DC | PRN
  Administered 2022-01-02: 4 mg via INTRAVENOUS
  Filled 2022-01-02: qty 2

## 2022-01-02 MED ORDER — ROCURONIUM BROMIDE 10 MG/ML (PF) SYRINGE
PREFILLED_SYRINGE | INTRAVENOUS | Status: DC | PRN
  Administered 2022-01-02: 100 mg via INTRAVENOUS
  Administered 2022-01-02: 20 mg via INTRAVENOUS

## 2022-01-02 MED ORDER — LIDOCAINE 2% (20 MG/ML) 5 ML SYRINGE
INTRAMUSCULAR | Status: DC | PRN
  Administered 2022-01-02: 60 mg via INTRAVENOUS

## 2022-01-02 MED ORDER — DOCUSATE SODIUM 100 MG PO CAPS
100.0000 mg | ORAL_CAPSULE | Freq: Two times a day (BID) | ORAL | Status: DC
  Administered 2022-01-02 (×2): 100 mg via ORAL
  Filled 2022-01-02 (×2): qty 1

## 2022-01-02 MED ORDER — MIDAZOLAM HCL 2 MG/2ML IJ SOLN
INTRAMUSCULAR | Status: AC
Start: 2022-01-02 — End: ?
  Filled 2022-01-02: qty 2

## 2022-01-02 SURGICAL SUPPLY — 25 items
BLADE SURG 10 STRL SS (BLADE) ×2 IMPLANT
CNTNR URN SCR LID CUP LEK RST (MISCELLANEOUS) IMPLANT
CONT SPEC 4OZ STRL OR WHT (MISCELLANEOUS)
GAUZE 4X4 16PLY ~~LOC~~+RFID DBL (SPONGE) ×4 IMPLANT
GLOVE BIO SURGEON STRL SZ7.5 (GLOVE) ×2 IMPLANT
GLOVE BIOGEL PI IND STRL 6.5 (GLOVE) ×1 IMPLANT
GLOVE BIOGEL PI INDICATOR 6.5 (GLOVE) ×1
GLOVE SURG ORTHO 8.0 STRL STRW (GLOVE) ×2 IMPLANT
GOWN STRL REUS W/ TWL LRG LVL3 (GOWN DISPOSABLE) ×3 IMPLANT
GOWN STRL REUS W/ TWL XL LVL3 (GOWN DISPOSABLE) ×1 IMPLANT
GOWN STRL REUS W/TWL LRG LVL3 (GOWN DISPOSABLE) ×6
GOWN STRL REUS W/TWL XL LVL3 (GOWN DISPOSABLE) ×2
HIBICLENS CHG 4% 4OZ BTL (MISCELLANEOUS) ×2 IMPLANT
NS IRRIG 1000ML POUR BTL (IV SOLUTION) ×2 IMPLANT
PACK TRENDGUARD 600 HYBRD PROC (MISCELLANEOUS) IMPLANT
PACK VAGINAL WOMENS (CUSTOM PROCEDURE TRAY) ×2 IMPLANT
PAD OB MATERNITY 4.3X12.25 (PERSONAL CARE ITEMS) ×2 IMPLANT
SUT VIC AB 2-0 CT1 18 (SUTURE) ×2 IMPLANT
SUT VIC AB 2-0 CT1 27 (SUTURE) ×2
SUT VIC AB 2-0 CT1 TAPERPNT 27 (SUTURE) ×1 IMPLANT
SUT VIC AB PLUS 45CM 1-MO-4 (SUTURE) ×5 IMPLANT
SUT VICRYL 1 TIES 12X18 (SUTURE) ×2 IMPLANT
TOWEL GREEN STERILE FF (TOWEL DISPOSABLE) ×4 IMPLANT
TRAY FOLEY W/BAG SLVR 14FR LF (SET/KITS/TRAYS/PACK) ×2 IMPLANT
TRENDGUARD 600 HYBRID PROC PK (MISCELLANEOUS) ×2

## 2022-01-02 NOTE — Op Note (Signed)
Deborah Andrews PROCEDURE DATE: 01/02/2022  PREOPERATIVE DIAGNOSIS:   menorrhagia, hx of abnormal pap POSTOPERATIVE DIAGNOSIS:  menorrhagia, hx of abnormal pap SURGEON:   Lynnda Shields M.D., FACOG ASSISTANT: Margie Ege, M.D. OPERATION:  Total Vaginal hysterectomy  ANESTHESIA:  General endotracheal.  INDICATIONS: The patient is a 36 y.o. [id number] with history of menorrhagia. The patient made a decision to undergo definite surgical treatment. On the preoperative visit, the risks, benefits, indications, and alternatives of the procedure were reviewed with the patient.  On the day of surgery, the risks of surgery were again discussed with the patient including but not limited to: bleeding which may require transfusion or reoperation; infection which may require antibiotics; injury to bowel, bladder, ureters or other surrounding organs; need for additional procedures; thromboembolic phenomenon, incisional problems and other postoperative/anesthesia complications. Written informed consent was obtained.     An experienced assistant was required given the standard of surgical care given the complexity of the case.  This assistant was needed for exposure, dissection, suctioning, retraction, instrument exchange, and for overall help during the procedure.  OPERATIVE FINDINGS: A 9 week size uterus with normal tubes and ovaries bilaterally.  ESTIMATED BLOOD LOSS: 200 ml FLUIDS:  1100 ml of Lactated Ringers URINE OUTPUT:  75 ml of clear yellow urine. SPECIMENS:  Uterus and cervix sent to pathology COMPLICATIONS:  None immediate.  DESCRIPTION OF PROCEDURE:  The patient received intravenous antibiotics and had sequential compression devices applied to her lower extremities while in the preoperative area.  She was then taken to the operating room where general anesthesia was administered and was found to be adequate.  She was placed in the dorsal lithotomy position, and was prepped and draped in a sterile  manner.  The patients bladder was drained with an indwelling foley catheter. After an adequate timeout was performed, attention was turned to her pelvis.  A weighted speculum was then placed in the vagina, and the anterior and posterior lips of the cervix were grasped bilaterally with tenaculums. The posterior cul-de-sac was entered sharply without difficulty. A suture was placed on the posterior vagina.  A long weighted speculum was inserted into the posterior cul-de-sac. The cervix was then circumferentially incised, and the bladder was dissected off the pubocervical fascia without complication.  The Heaney clamp was then used to clamp the uterosacral ligaments on either side.  They were then cut and sutured ligated with 0 Vicryl, and the ligated uterosacral ligaments were transfixed to the posterior lateral vaginal epithelium to further support the vagina and provide hemostasis. Of note, all sutures used in this case were 0 Vicryl unless otherwise noted.   The cardinal ligaments were then clamped, cut and ligated. The anterior cul-de-sac was then entered sharpely. The uterine vessels and broad ligaments were then serially clamped with the Heaney clamps, cut, and suture ligated on both sides.  The interior portion of the uterus was morcellated for improved clearance due to a narrow vagina.  Excellent hemostasis was noted at this point.  The cornua were clamped with the Heaney clamps, transected, and the uterus was delivered and sent to pathology. These pedicles were then suture ligated to ensure hemostasis.  There was a small of bleeding from the remainder of the uteroovarian pedicle which was suture ligated in a figure of eight technique.  Good hemostasis was noted.  Decision was made not to remove the bilateral tubes due to poor visualization.  All pedicles from the uterosacral ligament to the cornua were examined hemostasis was confirmed.  The  vaginal cuff was reefed in a running locked fashion then  reapproximated using figure of eight sutures care was given to incorporate the uterosacral pedicles bilaterally.  All instruments were then removed from the pelvis. The patient tolerated the procedure well.  All instruments, needles, and sponge counts were correct x 2. The patient was taken to the recovery room in stable condition.     Lynnda Shields, MD  Center for [facility]

## 2022-01-02 NOTE — Transfer of Care (Signed)
Immediate Anesthesia Transfer of Care Note  Patient: Deborah Andrews  Procedure(s) Performed: HYSTERECTOMY VAGINAL WITH SALPINGECTOMY (Bilateral: Vagina )  Patient Location: PACU  Anesthesia Type:General  Level of Consciousness: awake and alert   Airway & Oxygen Therapy: Patient Spontanous Breathing and Patient connected to face mask oxygen  Post-op Assessment: Report given to RN and Post -op Vital signs reviewed and stable  Post vital signs: Reviewed and stable  Last Vitals:  Vitals Value Taken Time  BP 147/118 01/02/22 1200  Temp    Pulse 112 01/02/22 1201  Resp 35 01/02/22 1201  SpO2 91 % 01/02/22 1201  Vitals shown include unvalidated device data.  Last Pain:  Vitals:   01/02/22 0839  TempSrc:   PainSc: 0-No pain      Patients Stated Pain Goal: 3 ([id number])  Complications: No notable events documented.

## 2022-01-02 NOTE — Anesthesia Procedure Notes (Signed)
Procedure Name: Intubation Date/Time: 01/02/2022 10:01 AM  Performed by: Michele Rockers, CRNAPre-anesthesia Checklist: Patient identified, Patient being monitored, Timeout performed, Emergency Drugs available and Suction available Patient Re-evaluated:Patient Re-evaluated prior to induction Oxygen Delivery Method: Circle System Utilized Preoxygenation: Pre-oxygenation with 100% oxygen Induction Type: IV induction Ventilation: Mask ventilation without difficulty, Oral airway inserted - appropriate to patient size and Two handed mask ventilation required Laryngoscope Size: Miller and 2 Grade View: Grade I Tube type: Oral Tube size: 7.0 mm Number of attempts: 1 Airway Equipment and Method: Stylet Placement Confirmation: ETT inserted through vocal cords under direct vision, positive ETCO2 and breath sounds checked- equal and bilateral Secured at: 21 cm Tube secured with: Tape Dental Injury: Teeth and Oropharynx as per pre-operative assessment

## 2022-01-02 NOTE — Anesthesia Postprocedure Evaluation (Signed)
Anesthesia Post Note  Patient: Deborah Andrews  Procedure(s) Performed: HYSTERECTOMY VAGINAL WITH SALPINGECTOMY (Bilateral: Vagina )     Patient location during evaluation: PACU Anesthesia Type: General Level of consciousness: awake and alert, oriented and patient cooperative Pain management: pain level controlled Vital Signs Assessment: post-procedure vital signs reviewed and stable Respiratory status: spontaneous breathing, nonlabored ventilation and respiratory function stable Cardiovascular status: blood pressure returned to baseline and stable Postop Assessment: no apparent nausea or vomiting Anesthetic complications: no   No notable events documented.  Last Vitals:  Vitals:   01/02/22 1230 01/02/22 1245  BP: 133/79 120/68  Pulse: 94 81  Resp: 18 20  Temp:    SpO2: 95% 95%    Last Pain:  Vitals:   01/02/22 1242  TempSrc:   PainSc: Martin

## 2022-01-02 NOTE — Interval H&P Note (Signed)
History and Physical Interval Note:  01/02/2022 9:08 AM  Deborah Andrews  has presented today for surgery, with the diagnosis of Menorrhagia.  The various methods of treatment have been discussed with the patient and family.  Endometrial biopsy revealed uterine polyp.  This may have been the source of her bleeding; however, pt desires definitive therapy for her bleeding as she has small children. After consideration of risks, benefits and other options for treatment, the patient has consented to  Procedure(s): HYSTERECTOMY VAGINAL WITH SALPINGECTOMY (Bilateral) as a surgical intervention.  The patient's history has been reviewed, patient examined, no change in status, stable for surgery.  I have reviewed the patient's chart and labs.  There is one error in the previous H and P.  It says abdomen gravid.  Pt is obese; however she is not pregnant as confirmed by today's pregnancy test.  Questions were answered to the patient's satisfaction.     Warden Fillers

## 2022-01-03 ENCOUNTER — Encounter (HOSPITAL_COMMUNITY): Payer: Self-pay | Admitting: Obstetrics and Gynecology

## 2022-01-03 DIAGNOSIS — N8003 Adenomyosis of the uterus: Secondary | ICD-10-CM | POA: Diagnosis not present

## 2022-01-03 LAB — CBC
HCT: 35.7 % — ABNORMAL LOW (ref 36.0–46.0)
Hemoglobin: 11.9 g/dL — ABNORMAL LOW (ref 12.0–15.0)
MCH: 30.1 pg (ref 26.0–34.0)
MCHC: 33.3 g/dL (ref 30.0–36.0)
MCV: 90.4 fL (ref 80.0–100.0)
Platelets: 326 10*3/uL (ref 150–400)
RBC: 3.95 MIL/uL (ref 3.87–5.11)
RDW: 13.3 % (ref 11.5–15.5)
WBC: 17.4 10*3/uL — ABNORMAL HIGH (ref 4.0–10.5)
nRBC: 0 % (ref 0.0–0.2)

## 2022-01-03 LAB — SURGICAL PATHOLOGY

## 2022-01-03 MED ORDER — OXYCODONE HCL 5 MG PO TABS: 5.0000 mg | ORAL_TABLET | ORAL | 0 refills | Status: DC | PRN

## 2022-01-03 MED ORDER — IBUPROFEN 600 MG PO TABS: 600.0000 mg | ORAL_TABLET | Freq: Four times a day (QID) | ORAL | 0 refills | Status: DC

## 2022-01-03 MED ORDER — NICOTINE 21 MG/24HR TD PT24: 21.0000 mg | MEDICATED_PATCH | Freq: Every day | TRANSDERMAL | 0 refills | Status: DC

## 2022-01-03 NOTE — Progress Notes (Signed)
   01/03/22 0925  Departure Condition  Departure Condition Good  Mobility at [facility]  Patient/Caregiver Teaching Teach Back Method Used;Discharge instructions reviewed;Prescriptions reviewed;Follow-up care reviewed;Pain management discussed;Medications discussed;Patient/caregiver verbalized understanding  Departure Mode With family  Was procedural sedation performed on this patient during this visit? Yes   Patient alert and oriented x4, and VS and pain stable. Provider aware of discharge VS.

## 2022-01-03 NOTE — Discharge Summary (Signed)
Physician Discharge Summary  Patient ID: Deborah Andrews MRN: [id number] DOB/AGE: [birth year] 34 y.o.  Admit date: 01/02/2022 Discharge date: 01/03/2022  Admission Diagnoses: menorrhagia  Discharge Diagnoses:  Principal Problem:   S/P vaginal hysterectomy Active Problems:   Menorrhagia with regular cycle   Discharged Condition: good  Hospital Course: Pt was admitted for vaginal hysterectomy for definitive therapy for menorrhagia.  Please see separate operative note.  DOS pt was awake and alert, doing well.  She had already eaten regular diet by the time I had seen her that afternoon.  Foley was out and patient had voided.  Pt seen the following morning.  She continued to do well as she had done the previous evening.  Pt has tolerated regular diet and passed flatus.  Pain is well controlled on tylenoil and ibuprofen.  O2 sats have been a concern while asleep, but pt has sleep apnea.  Pt has had a normal post op course and was discharged home.  Consults: None  Significant Diagnostic Studies: n/a  Treatments: surgery: vaginal hysterectomy  Discharge Exam: Blood pressure (!) 118/49, pulse 82, temperature 98.7 F (37.1 C), temperature source Oral, resp. rate 14, height 5' 3.5" (1.613 m), weight (!) 142.9 kg, last menstrual period 12/14/2021, SpO2 92 %. General appearance: alert, cooperative, no distress, and morbidly obese Head: Normocephalic, without obvious abnormality, atraumatic Resp: clear to auscultation bilaterally Cardio: regular rate and rhythm GI: soft, non-tender; bowel sounds normal; no masses,  no organomegaly Extremities: Homans sign is negative, no sign of DVT and no edema, redness or tenderness in the calves or thighs  Disposition: Discharge disposition: 01-Home or Self Care       Discharge Instructions     Call MD for:  difficulty breathing, headache or visual disturbances   Complete by: As directed    Call MD for:  persistant dizziness or light-headedness    Complete by: As directed    Call MD for:  persistant nausea and vomiting   Complete by: As directed    Call MD for:  severe uncontrolled pain   Complete by: As directed    Call MD for:  temperature >100.4   Complete by: As directed    Diet - low sodium heart healthy   Complete by: As directed    Driving Restrictions   Complete by: As directed    No driving while taking narcotics   Increase activity slowly   Complete by: As directed    Lifting restrictions   Complete by: As directed    No lifting more than 15-20 pounds   Sexual Activity Restrictions   Complete by: As directed    Pelvic rest 8 weeks      Allergies as of 01/03/2022   No Known Allergies      Medication List     STOP taking these medications    ALPRAZolam 0.5 MG tablet Commonly known as: Xanax   methocarbamol 500 MG tablet Commonly known as: Robaxin   multivitamin tablet   omeprazole 20 MG capsule Commonly known as: PRILOSEC       TAKE these medications    ibuprofen 600 MG tablet Commonly known as: ADVIL Take 1 tablet (600 mg total) by mouth every 6 (six) hours. What changed:  when to take this reasons to take this   loratadine 10 MG tablet Commonly known as: CLARITIN Take 10 mg by mouth daily. Pt takes over the counter Aller-itin for allergies.   MORINGA PO Take 1,000 mg by mouth daily. Moringa an over  the counter multi vitamin supplement.   nicotine 21 mg/24hr patch Commonly known as: NICODERM CQ - dosed in mg/24 hours Place 1 patch (21 mg total) onto the skin daily.   oxyCODONE 5 MG immediate release tablet Commonly known as: Oxy IR/ROXICODONE Take 1-2 tablets (5-10 mg total) by mouth every 4 (four) hours as needed for moderate pain.   Ventolin HFA 108 (90 Base) MCG/ACT inhaler Generic drug: albuterol INHALE 2 PUFFS INTO THE LUNGS EVERY 4 HOURS AS NEEDED FOR WHEEZING OR SHORTNESS OF BREATH        Follow-up Information     Griffin Basil, MD. Schedule an appointment as  soon as possible for a visit in 5 week(s).   Specialty: Obstetrics and Gynecology Why: post op appointment Contact information: [place] [address] [phone]                 Signed: Griffin Basil 01/03/2022, 8:13 AM

## 2022-01-03 NOTE — Plan of Care (Signed)

## 2022-01-04 ENCOUNTER — Encounter: Payer: Self-pay | Admitting: Obstetrics and Gynecology

## 2022-01-04 ENCOUNTER — Telehealth: Payer: Self-pay

## 2022-01-04 NOTE — Telephone Encounter (Addendum)
Transition Care Management Follow-up Telephone Call Date of discharge and from where: 01/03/2022 from [facility] How have you been since you were released from the hospital? Patient stated that she is feeling well. Patient stated that the pain is tolerable and is not having any difficulty.  Any questions or concerns? Yes There were questions about patient taking ibuprofen and also restarting the omeprazole. Patient encouraged to contact Dr. Laverna Peace office.   Items Reviewed: Did the pt receive and understand the discharge instructions provided? Yes  Medications obtained and verified? Yes  Other? No  Any new allergies since your discharge? No  Dietary orders reviewed? No Do you have support at home? Yes   Functional Questionnaire: (I = Independent and D = Dependent) ADLs: I  Bathing/Dressing- I  Meal Prep- I  Eating- I  Maintaining continence- I  Transferring/Ambulation- I  Managing Meds- I   Follow up appointments reviewed:  PCP Hospital f/u appt confirmed? No   Specialist Hospital f/u appt confirmed? Yes  Scheduled to see Deborah Aloe, MD on 02/12/2022 @ 3:35pm. Are transportation arrangements needed? No  If their condition worsens, is the pt aware to call PCP or go to the Emergency Dept.? Yes Was the patient provided with contact information for the PCP's office or ED? Yes Was to pt encouraged to call back with questions or concerns? Yes

## 2022-01-08 ENCOUNTER — Encounter (HOSPITAL_COMMUNITY): Payer: Self-pay | Admitting: Family Medicine

## 2022-01-08 ENCOUNTER — Inpatient Hospital Stay (HOSPITAL_COMMUNITY)
Admission: AD | Admit: 2022-01-08 | Discharge: 2022-01-09 | Disposition: A | Discharge status: Home / Self Care | Payer: Medicaid Other | Attending: Family Medicine | Admitting: Family Medicine

## 2022-01-08 DIAGNOSIS — Z90710 Acquired absence of both cervix and uterus: Secondary | ICD-10-CM | POA: Insufficient documentation

## 2022-01-08 MED ORDER — SODIUM CHLORIDE 0.9 % IV SOLN
2.0000 g | Freq: Once | INTRAVENOUS | Status: AC
  Administered 2022-01-09: 2 g via INTRAVENOUS
  Filled 2022-01-08: qty 2

## 2022-01-08 MED ORDER — METRONIDAZOLE 500 MG/100ML IV SOLN
500.0000 mg | Freq: Once | INTRAVENOUS | Status: AC
  Administered 2022-01-09: 500 mg via INTRAVENOUS
  Filled 2022-01-08: qty 100

## 2022-01-08 MED ORDER — MORPHINE SULFATE (PF) 4 MG/ML IV SOLN
2.0000 mg | Freq: Once | INTRAVENOUS | Status: AC
  Administered 2022-01-09: 2 mg via INTRAVENOUS
  Filled 2022-01-08: qty 1

## 2022-01-08 MED ORDER — KETOROLAC TROMETHAMINE 30 MG/ML IJ SOLN
30.0000 mg | Freq: Once | INTRAMUSCULAR | Status: AC
  Administered 2022-01-09: 30 mg via INTRAVENOUS
  Filled 2022-01-08: qty 1

## 2022-01-08 NOTE — MAU Note (Signed)
..  Deborah Andrews is a 36 y.o. at Unknown here in MAU reporting: Hysterectomy on 01/02/2022. Reports she wens to the bathroom she got a pain ("like something ripped out of me") She has not had any pain since surgery other than a small pain when she needs to void, but nothing like this. At 8:10 pm she took 2 oxycodone's and has not felt any relief.   Onset of complaint: tonight Pain score: 10/10 Vitals:   01/08/22 2224  BP: 122/74  Pulse: 100  Resp: 20  Temp: 98.3 F (36.8 C)  SpO2: 99%

## 2022-01-09 ENCOUNTER — Other Ambulatory Visit: Payer: Self-pay | Admitting: Obstetrics and Gynecology

## 2022-01-09 ENCOUNTER — Inpatient Hospital Stay (HOSPITAL_COMMUNITY): Payer: Medicaid Other

## 2022-01-09 DIAGNOSIS — Z90710 Acquired absence of both cervix and uterus: Secondary | ICD-10-CM

## 2022-01-09 DIAGNOSIS — R102 Pelvic and perineal pain: Secondary | ICD-10-CM | POA: Diagnosis not present

## 2022-01-09 LAB — CBC
HCT: 43.4 % (ref 36.0–46.0)
Hemoglobin: 14.1 g/dL (ref 12.0–15.0)
MCH: 29.9 pg (ref 26.0–34.0)
MCHC: 32.5 g/dL (ref 30.0–36.0)
MCV: 92.1 fL (ref 80.0–100.0)
Platelets: 448 10*3/uL — ABNORMAL HIGH (ref 150–400)
RBC: 4.71 MIL/uL (ref 3.87–5.11)
RDW: 13.3 % (ref 11.5–15.5)
WBC: 23.4 10*3/uL — ABNORMAL HIGH (ref 4.0–10.5)
nRBC: 0 % (ref 0.0–0.2)

## 2022-01-09 LAB — COMPREHENSIVE METABOLIC PANEL
ALT: 14 U/L (ref 0–44)
AST: 16 U/L (ref 15–41)
Albumin: 3.3 g/dL — ABNORMAL LOW (ref 3.5–5.0)
Alkaline Phosphatase: 50 U/L (ref 38–126)
Anion gap: 9 (ref 5–15)
BUN: 7 mg/dL (ref 6–20)
CO2: 23 mmol/L (ref 22–32)
Calcium: 9.2 mg/dL (ref 8.9–10.3)
Chloride: 105 mmol/L (ref 98–111)
Creatinine, Ser: 0.92 mg/dL (ref 0.44–1.00)
GFR, Estimated: >60 mL/min (ref >60)
Glucose, Bld: 132 mg/dL — ABNORMAL HIGH (ref 70–99)
Potassium: 4.4 mmol/L (ref 3.5–5.1)
Sodium: 137 mmol/L (ref 135–145)
Total Bilirubin: 0.3 mg/dL (ref 0.3–1.2)
Total Protein: 7.4 g/dL (ref 6.5–8.1)

## 2022-01-09 MED ORDER — SODIUM CHLORIDE 0.9 % IV SOLN
Freq: Once | INTRAVENOUS | Status: AC
Start: 2022-01-09 — End: 2022-01-09

## 2022-01-09 MED ORDER — IOHEXOL 300 MG/ML  SOLN
100.0000 mL | Freq: Once | INTRAMUSCULAR | Status: AC | PRN
  Administered 2022-01-09: 100 mL via INTRAVENOUS

## 2022-01-09 MED ORDER — IOHEXOL 9 MG/ML PO SOLN
ORAL | Status: AC
  Administered 2022-01-09: 500 mL
  Filled 2022-01-09: qty 1000

## 2022-01-09 MED ORDER — AMOXICILLIN-POT CLAVULANATE 875-125 MG PO TABS: 1.0000 | ORAL_TABLET | Freq: Two times a day (BID) | ORAL | 0 refills | Status: DC

## 2022-01-09 NOTE — MAU Note (Signed)
Notified Ana CT tech that Pt had completed her contrast. Will send transport for her when needed for scan.

## 2022-01-09 NOTE — MAU Provider Note (Signed)
[facility]ulty [facility] OB/GYN Attending MAU Note  Chief Complaint: Abdominal Pain and Pelvic Pain    None     SUBJECTIVE Deborah Andrews is a 36 y.o. [id number] at Unknown by LMP who presents with recent TVH on 01/02/2022. She was doing great and had minimal pain until today. She then was voiding and developed acute onset severe lower abdominal pain.  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 Valtrex prn outbreaks   Sleep apnea    done in [place] per pt, does not wear CPAP   OB History  Gravida Para Term Preterm AB Living  2 2   2   2   SAB IAB Ectopic Multiple Live Births          2    # Outcome Date GA Lbr Len/2nd Weight Sex Delivery Anes PTL Lv  2 Preterm 2014    M CS-Unspec   LIV  1 Preterm 2009    F CS-Unspec  Y LIV   Past Surgical History:  Procedure Laterality Date   CERVICAL CONE BIOPSY     CESAREAN SECTION     x2   CHOLECYSTECTOMY     ENDOMETRIAL BIOPSY     TONSILLECTOMY     TUBAL LIGATION     VAGINAL HYSTERECTOMY Bilateral 01/02/2022   Procedure: HYSTERECTOMY VAGINAL WITH SALPINGECTOMY;  Surgeon: Griffin Basil, MD;  Location: [place];  Service: Gynecology;  Laterality: Bilateral;   WISDOM TOOTH EXTRACTION     Social History   Socioeconomic History   Marital status: Significant Other    Spouse name: boyfriend   Number of children: Not on file   Years of education: Not on file   Highest education level: Not on file  Occupational History   Not on file  Tobacco Use   Smoking status: Every Day    Packs/day: 1.00    Years: 22.00    Total pack years: 22.00    Types: Cigarettes   Smokeless tobacco: Never  Vaping Use    Vaping Use: Never used  Substance and Sexual Activity   Alcohol use: Yes    Comment: occ- on holidays   Drug use: Never   Sexual activity: Yes    Birth control/protection: Surgical  Other Topics Concern   Not on file  Social History Narrative   Not on file   Social Determinants of Health   Financial Resource Strain: Not on file  Food Insecurity: Food Insecurity Present (05/30/2021)   Hunger Vital Sign    Worried About Running Out of Food in the Last Year: Sometimes true    Ran Out of Food in the Last Year: Sometimes true  Transportation Needs: No Transportation Needs (05/30/2021)   PRAPARE - Hydrologist (Medical): No    Lack of Transportation (Non-Medical): No  Physical Activity: Not on file  Stress: Not on file  Social Connections: Not on file  Intimate Partner Violence: Not on file   No current facility-administered medications on file prior to encounter.   Current Outpatient  Medications on File Prior to Encounter  Medication Sig Dispense Refill   ibuprofen (ADVIL) 600 MG tablet Take 1 tablet (600 mg total) by mouth every 6 (six) hours. 30 tablet 0   loratadine (CLARITIN) 10 MG tablet Take 10 mg by mouth daily. Pt takes over the counter Aller-itin for allergies.     Multiple Vitamin (MULTIVITAMIN) capsule Take 1 capsule by mouth daily.     oxyCODONE (OXY IR/ROXICODONE) 5 MG immediate release tablet Take 1-2 tablets (5-10 mg total) by mouth every 4 (four) hours as needed for moderate pain. 20 tablet 0   Moringa Oleifera (MORINGA PO) Take 1,000 mg by mouth daily. Moringa an over the counter multi vitamin supplement. (Patient not taking: Reported on 12/27/2021)     nicotine (NICODERM CQ - DOSED IN MG/24 HOURS) 21 mg/24hr patch Place 1 patch (21 mg total) onto the skin daily. 28 patch 0   VENTOLIN HFA 108 (90 Base) MCG/ACT inhaler INHALE 2 PUFFS INTO THE LUNGS EVERY 4 HOURS AS NEEDED FOR WHEEZING OR SHORTNESS OF BREATH (Patient not taking: Reported on  12/27/2021) 18 g 1   No Known Allergies  ROS: Pertinent items in HPI  OBJECTIVE BP 122/74 (BP Location: Right Arm)   Pulse 100   Temp 98.3 F (36.8 C) (Oral)   Resp 20   Ht 5' 3.5" (1.613 m)   Wt (!) 144.7 kg   LMP 12/14/2021   SpO2 99%   BMI 55.60 kg/m  CONSTITUTIONAL: Well-developed, well-nourished female in no acute distress.  HENT:  Normocephalic, atraumatic, External right and left ear normal. Oropharynx is clear and moist EYES: Conjunctivae and EOM are normal.  No scleral icterus.  NECK: Normal range of motion, supple, no masses.  Normal thyroid.  SKIN: Skin is warm and dry. No rash noted. Not diaphoretic. No erythema. No pallor. NEUROLGIC: Alert and oriented to person, place, and time. Normal reflexes, muscle tone coordination. No cranial nerve deficit noted. PSYCHIATRIC: Normal mood and affect. Normal behavior. Normal judgment and thought content. CARDIOVASCULAR: Normal heart rate noted RESPIRATORY: Effort and breath sounds normal, no problems with respiration noted. ABDOMEN: Soft, normal bowel sounds, no distention noted.  No tenderness, rebound or guarding.  PELVIC: Normal appearing external genitalia; normal appearing vaginal mucosa, no discharge, cuff appears intact. There is a lot of tenderness on exam especially on the right MUSCULOSKELETAL: Normal range of motion. No tenderness.  No cyanosis, clubbing, or edema.  2+ distal pulses.  LAB RESULTS Results for orders placed or performed during the hospital encounter of 01/08/22 (from the past 48 hour(s))  CBC     Status: Abnormal   Collection Time: 01/09/22 12:19 AM  Result Value Ref Range   WBC 23.4 (H) 4.0 - 10.5 K/uL   RBC 4.71 3.87 - 5.11 MIL/uL   Hemoglobin 14.1 12.0 - 15.0 g/dL   HCT 63.8 17.7 - 11.6 %   MCV 92.1 80.0 - 100.0 fL   MCH 29.9 26.0 - 34.0 pg   MCHC 32.5 30.0 - 36.0 g/dL   RDW 57.9 03.8 - 33.3 %   Platelets 448 (H) 150 - 400 K/uL   nRBC 0.0 0.0 - 0.2 %    Comment: Performed at [facility], [address] [place], [place] [address]  Comprehensive metabolic panel     Status: Abnormal   Collection Time: 01/09/22  2:20 AM  Result Value Ref Range   Sodium 137 135 - 145 mmol/L   Potassium 4.4 3.5 - 5.1 mmol/L  Chloride 105 98 - 111 mmol/L   CO2 23 22 - 32 mmol/L   Glucose, Bld 132 (H) 70 - 99 mg/dL    Comment: Glucose reference range applies only to samples taken after fasting for at least 8 hours.   BUN 7 6 - 20 mg/dL   Creatinine, Ser 6.96 0.44 - 1.00 mg/dL   Calcium 9.2 8.9 - 78.9 mg/dL   Total Protein 7.4 6.5 - 8.1 g/dL   Albumin 3.3 (L) 3.5 - 5.0 g/dL   AST 16 15 - 41 U/L   ALT 14 0 - 44 U/L   Alkaline Phosphatase 50 38 - 126 U/L   Total Bilirubin 0.3 0.3 - 1.2 mg/dL   GFR, Estimated >38 >10 mL/min    Comment: (NOTE) Calculated using the CKD-EPI Creatinine Equation (2021)    Anion gap 9 5 - 15    Comment: Performed at [facility], [address], [place], [place] [address]    IMAGING CT ABDOMEN PELVIS W CONTRAST  Result Date: 01/09/2022 CLINICAL DATA:  Pelvic pain. Vaginal hysterectomy with bilateral salpingectomy on 01/02/2022. EXAM: CT ABDOMEN AND PELVIS WITH CONTRAST TECHNIQUE: Multidetector CT imaging of the abdomen and pelvis was performed using the standard protocol following bolus administration of intravenous contrast. RADIATION DOSE REDUCTION: This exam was performed according to the departmental dose-optimization program which includes automated exposure control, adjustment of the mA and/or kV according to patient size and/or use of iterative reconstruction technique. CONTRAST:  OMNIPAQUE IOHEXOL 300 MG/ML  SOLN COMPARISON:  None Available. FINDINGS: Lower chest: Unremarkable. Hepatobiliary: No suspicious focal abnormality within the liver parenchyma. Gallbladder is surgically absent. No intrahepatic or extrahepatic biliary dilation. Pancreas: No focal mass lesion. No dilatation of the main duct. No intraparenchymal cyst. No peripancreatic  edema. Spleen: No splenomegaly. No focal mass lesion. Adrenals/Urinary Tract: No adrenal nodule or mass. Kidneys unremarkable. No evidence for hydroureter. The urinary bladder appears normal for the degree of distention. Stomach/Bowel: Stomach is moderately distended with food. Duodenum is normally positioned as is the ligament of Treitz. No small bowel wall thickening. No small bowel dilatation. The terminal ileum is normal. The appendix is normal. No gross colonic mass. No colonic wall thickening. Vascular/Lymphatic: No abdominal aortic aneurysm. No abdominal aortic atherosclerotic calcification. There is no gastrohepatic or hepatoduodenal ligament lymphadenopathy. No retroperitoneal or mesenteric lymphadenopathy. No pelvic sidewall lymphadenopathy. Reproductive: Uterus surgically absent. Trace fluid in the central pelvis, above the vaginal cuff is not unexpected 7 days after surgery and may well be physiologic. No adnexal mass. No evidence for pelvic hematoma or gross hemoperitoneum. Other: None. Musculoskeletal: No worrisome lytic or sclerotic osseous abnormality. IMPRESSION: 1. No acute findings in the abdomen or pelvis. 2. Trace fluid in the central pelvis, not unexpected 7 days after surgery and may well be physiologic. There is no pelvic hematoma or evidence for gross hemoperitoneum. No rim enhancing fluid collection to suggest the presence of an abscess. 3. No hydroureteronephrosis. No extraperitoneal or retroperitoneal fluid collection. Electronically Signed   By: Kennith Center M.D.   On: 01/09/2022 06:24    MAU COURSE  ASSESSMENT 1. S/P vaginal hysterectomy     PLAN Discharge home - no evidence of abscess. Will treat for cuff cellulitis. OK for DC--Pelvic rest continue Abx--may use pain meds prn.  Follow-up Information     [facility] Follow up in 1 week(s).   Specialty: Obstetrics and Gynecology Why: postop check Contact  information: [address] [place] [address] [phone]               Allergies as of 01/09/2022   No Known Allergies      Medication List     TAKE these medications    amoxicillin-clavulanate 875-125 MG tablet Commonly known as: AUGMENTIN Take 1 tablet by mouth every 12 (twelve) hours.   ibuprofen 600 MG tablet Commonly known as: ADVIL Take 1 tablet (600 mg total) by mouth every 6 (six) hours.   loratadine 10 MG tablet Commonly known as: CLARITIN Take 10 mg by mouth daily. Pt takes over the counter Aller-itin for allergies.   MORINGA PO Take 1,000 mg by mouth daily. Moringa an over the counter multi vitamin supplement.   multivitamin capsule Take 1 capsule by mouth daily.   nicotine 21 mg/24hr patch Commonly known as: NICODERM CQ - dosed in mg/24 hours Place 1 patch (21 mg total) onto the skin daily.   oxyCODONE 5 MG immediate release tablet Commonly known as: Oxy IR/ROXICODONE Take 1-2 tablets (5-10 mg total) by mouth every 4 (four) hours as needed for moderate pain.   Ventolin HFA 108 (90 Base) MCG/ACT inhaler Generic drug: albuterol INHALE 2 PUFFS INTO THE LUNGS EVERY 4 HOURS AS NEEDED FOR WHEEZING OR SHORTNESS OF Carilyn Goodpasture, MD 01/09/2022 6:32 AM

## 2022-02-12 ENCOUNTER — Encounter: Payer: Self-pay | Admitting: Obstetrics and Gynecology

## 2022-02-12 ENCOUNTER — Ambulatory Visit (INDEPENDENT_AMBULATORY_CARE_PROVIDER_SITE_OTHER): Payer: Medicaid Other | Admitting: Obstetrics and Gynecology

## 2022-02-12 ENCOUNTER — Other Ambulatory Visit: Payer: Self-pay

## 2022-02-12 VITALS — BP 118/64 | HR 92 | Wt 313.5 lb

## 2022-02-12 DIAGNOSIS — Z9889 Other specified postprocedural states: Secondary | ICD-10-CM

## 2022-02-12 NOTE — Progress Notes (Signed)
    Subjective:    Deborah Andrews is a 36 y.o. female who presents to the clinic status post TVH on 01/02/22. The patient is not having any pain.  Eating a regular diet without difficulty. Bowel movements are normal. No other significant postoperative concerns.  The following portions of the patient's history were reviewed and updated as appropriate: allergies, current medications, past family history, past medical history, past social history, past surgical history, and problem list..  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  Review of Systems Pertinent items are noted in HPI.   Objective:   BP 118/64   Pulse 92   Wt (!) 313 lb 8 oz (142.2 kg)   LMP 12/14/2021   BMI 54.66 kg/m  Constitutional:  Well-developed, well-nourished female in no acute distress.   Skin: Skin is warm and dry, no rash noted, not diaphoretic,no erythema, no pallor.  Cardiovascular: Normal heart rate noted  Respiratory: Effort and breath sounds normal, no problems with respiration noted  Abdomen: Soft, bowel sounds active, non-tender, no abnormal masses  Incision: N/a  Pelvic:    Vaginal cuff well approximated, small area of granulation tissue noted   Cuff nontender   Surgical pathology () Superficial adenomyosis Assessment:   Doing well postoperatively.  Operative findings again reviewed. Pathology report discussed.   Plan:   1. Continue any current medications. 2. Wound care discussed. 3. Activity restrictions: none, pelvic rest another 3-4 weeks 4. Anticipated return to work: not applicable. 5. Follow up in 1 year as last pap was CIN 1 Pap in 2 years 6.  Routine preventative health maintenance measures emphasized. Please refer to After Visit Summary for other counseling recommendations.    Mariel Aloe, MD, FACOG Attending Obstetrician & Gynecologist Center for [facility]

## 2022-03-09 ENCOUNTER — Other Ambulatory Visit: Payer: Self-pay | Admitting: Family Medicine

## 2022-03-14 ENCOUNTER — Ambulatory Visit (INDEPENDENT_AMBULATORY_CARE_PROVIDER_SITE_OTHER): Payer: Medicaid Other | Admitting: Family Medicine

## 2022-03-14 ENCOUNTER — Encounter: Payer: Self-pay | Admitting: Family Medicine

## 2022-03-14 VITALS — BP 118/76 | HR 99 | Temp 98.7°F | Ht 63.5 in | Wt 319.2 lb

## 2022-03-14 DIAGNOSIS — G4733 Obstructive sleep apnea (adult) (pediatric): Secondary | ICD-10-CM

## 2022-03-14 DIAGNOSIS — R609 Edema, unspecified: Secondary | ICD-10-CM | POA: Diagnosis not present

## 2022-03-14 MED ORDER — POTASSIUM CHLORIDE CRYS ER 20 MEQ PO TBCR: 20.0000 meq | EXTENDED_RELEASE_TABLET | Freq: Every day | ORAL | 3 refills | Status: DC

## 2022-03-14 MED ORDER — FUROSEMIDE 20 MG PO TABS: 20.0000 mg | ORAL_TABLET | Freq: Every day | ORAL | 3 refills | Status: DC | PRN

## 2022-03-14 NOTE — Progress Notes (Signed)
Subjective  CC:  Chief Complaint  Patient presents with   Leg Swelling    Pt stated that she has been having some leg swelling and would like a referral     HPI: Deborah Andrews is a 36 y.o. female who presents to the office today to address the problems listed above in the chief complaint. 36 year old female, BMI 20, presents for lower extremity swelling.  Dependent.  Worse at night.  Complains of intermittent pain.  No calf pain.  Mostly better with elevation. She is trying to eat more vegetables, trying to lose weight Reports history of sleep apnea.  Had recent hysterectomy and she reports that surgeon recommended another sleep study.  She failed CPAP in the past.  She admits to snoring  Assessment  1. Dependent edema   2. Morbid obesity (HCC)   3. OSA (obstructive sleep apnea)      Plan  Dependent edema: No symptoms of heart failure.  Recent lab work showed mildly low albumin.  We will need to check for proteinuria.  Patient will return for that.  Start Lasix as needed with potassium.  Elevate legs.  Discussed really etiology needs to help you need for weight loss. Refer to pulmonology for sleep study.    Follow up: March for complete physical. Visit date not found  Orders Placed This Encounter  Procedures   Ambulatory referral to Pulmonology   Meds ordered this encounter  Medications   furosemide (LASIX) 20 MG tablet    Sig: Take 1 tablet (20 mg total) by mouth daily as needed for edema. Take with potassium    Dispense:  30 tablet    Refill:  3   potassium chloride SA (KLOR-CON M) 20 MEQ tablet    Sig: Take 1 tablet (20 mEq total) by mouth daily.    Dispense:  30 tablet    Refill:  3      I reviewed the patients updated PMH, FH, and SocHx.    Patient Active Problem List   Diagnosis Date Noted   At high risk for breast cancer 08/04/2020    Priority: High   Cigarette nicotine dependence without complication 06/07/2020    Priority: High   Morbid obesity (HCC)  06/07/2020    Priority: High   Depression, major, recurrent, in complete remission (HCC) 06/07/2020    Priority: High   Menorrhagia with regular cycle 11/09/2020    Priority: Medium    Dysplasia of cervix, low grade (CIN 1) 11/09/2020    Priority: Medium    Mild intermittent asthma without complication 06/07/2020    Priority: Medium    Herpes simplex 12/05/2020    Priority: Low   Genetic testing 07/27/2020    Priority: Low   S/P vaginal hysterectomy 01/02/2022   Syncope 2022 08/03/2021   Pelvic pain in female 11/09/2020   Current Meds  Medication Sig   furosemide (LASIX) 20 MG tablet Take 1 tablet (20 mg total) by mouth daily as needed for edema. Take with potassium   loratadine (CLARITIN) 10 MG tablet Take 10 mg by mouth daily. Pt takes over the counter Aller-itin for allergies.   Moringa Oleifera (MORINGA PO) Take 1,000 mg by mouth daily. Moringa an over the counter multi vitamin supplement.   Multiple Vitamin (MULTIVITAMIN) capsule Take 1 capsule by mouth daily.   nicotine (NICODERM CQ - DOSED IN MG/24 HOURS) 21 mg/24hr patch Place 1 patch (21 mg total) onto the skin daily.   omeprazole (PRILOSEC) 20 MG capsule TAKE  1 CAPSULE(20 MG) BY MOUTH DAILY   potassium chloride SA (KLOR-CON M) 20 MEQ tablet Take 1 tablet (20 mEq total) by mouth daily.   VENTOLIN HFA 108 (90 Base) MCG/ACT inhaler INHALE 2 PUFFS INTO THE LUNGS EVERY 4 HOURS AS NEEDED FOR WHEEZING OR SHORTNESS OF BREATH    Allergies: Patient has No Known Allergies. Family History: Patient family history includes Breast cancer in her mother; Breast cancer (age of onset: 47) in her maternal uncle; Cancer in her maternal grandfather; Cancer (age of onset: 10) in her mother; Cancer - Other (age of onset: 43) in her mother; Cervical cancer (age of onset: 51) in her half-sister; Diabetes in her father and mother; Hypertension in her mother; Leukemia (age of onset: 2) in her half-brother; Melanoma in her maternal grandfather and  maternal grandmother; Skin cancer in her maternal aunt. Social History:  Patient  reports that she has been smoking cigarettes. She has a 22.00 pack-year smoking history. She has never used smokeless tobacco. She reports current alcohol use. She reports that she does not use drugs.  Review of Systems: Constitutional: Negative for fever malaise or anorexia Cardiovascular: negative for chest pain Respiratory: negative for SOB or persistent cough Gastrointestinal: negative for abdominal pain  Objective  Vitals: BP 118/76   Pulse 99   Temp 98.7 F (37.1 C)   Ht 5' 3.5" (1.613 m)   Wt (!) 319 lb 3.2 oz (144.8 kg)   LMP 12/14/2021   SpO2 95%   BMI 55.66 kg/m  General: no acute distress , A&Ox3 HEENT: PEERL, conjunctiva normal, neck is supple Cardiovascular:  RRR without murmur or gallop.  Respiratory:  Good breath sounds bilaterally, CTAB with normal respiratory effort Skin:  Warm, no rashes Extremities: No pitting edema present currently.  With normal distal pulses.  No calf tenderness    Commons side effects, risks, benefits, and alternatives for medications and treatment plan prescribed today were discussed, and the patient expressed understanding of the given instructions. Patient is instructed to call or message via MyChart if he/she has any questions or concerns regarding our treatment plan. No barriers to understanding were identified. We discussed Red Flag symptoms and signs in detail. Patient expressed understanding regarding what to do in case of urgent or emergency type symptoms.  Medication list was reconciled, printed and provided to the patient in AVS. Patient instructions and summary information was reviewed with the patient as documented in the AVS. This note was prepared with assistance of Dragon voice recognition software. Occasional wrong-word or sound-a-like substitutions may have occurred due to the inherent limitations of voice recognition software  This visit  occurred during the SARS-CoV-2 public health emergency.  Safety protocols were in place, including screening questions prior to the visit, additional usage of staff PPE, and extensive cleaning of exam room while observing appropriate contact time as indicated for disinfecting solutions.

## 2022-03-14 NOTE — Patient Instructions (Signed)
Please return in March 2023 for your annual complete physical; please come fasting.   See below for information on edema and weight loss  If you have any questions or concerns, please don't hesitate to send me a message via MyChart or call the office at [phone]. Thank you for visiting with [place] today! It's our pleasure caring for you.   Edema  Edema is an abnormal buildup of fluids in the body tissues and under the skin. Swelling of the legs, feet, and ankles is a common symptom that becomes more likely as you get older. Swelling is also common in looser tissues, such as around the eyes. Pressing on the area may make a temporary dent in your skin (pitting edema). This fluid may also accumulate in your lungs (pulmonary edema). There are many possible causes of edema. Eating too much salt (sodium) and being on your feet or sitting for a long time can cause edema in your legs, feet, and ankles. Common causes of edema include: Certain medical conditions, such as heart failure, liver or kidney disease, and cancer. Weak leg blood vessels. An injury. Pregnancy. Medicines. Being obese. Low protein levels in the blood. Hot weather may make edema worse. Edema is usually painless. Your skin may look swollen or shiny. Follow these instructions at home: Medicines Take over-the-counter and prescription medicines only as told by your health care provider. Your health care provider may prescribe a medicine to help your body get rid of extra water (diuretic). Take this medicine if you are told to take it. Eating and drinking Eat a low-salt (low-sodium) diet to reduce fluid as told by your health care provider. Sometimes, eating less salt may reduce swelling. Depending on the cause of your swelling, you may need to limit how much fluid you drink (fluid restriction). General instructions Raise (elevate) the injured area above the level of your heart while you are sitting or lying down. Do not sit still or  stand for long periods of time. Do not wear tight clothing. Do not wear garters on your upper legs. Exercise your legs to get your circulation going. This helps to move the fluid back into your blood vessels, and it may help the swelling go down. Wear compression stockings as told by your health care provider. These stockings help to prevent blood clots and reduce swelling in your legs. It is important that these are the correct size. These stockings should be prescribed by your health care provider to prevent possible injuries. If elastic bandages or wraps are recommended, use them as told by your health care provider. Contact a health care provider if: Your edema does not get better with treatment. You have heart, liver, or kidney disease and have symptoms of edema. You have sudden and unexplained weight gain. Get help right away if: You develop shortness of breath or chest pain. You cannot breathe when you lie down. You develop pain, redness, or warmth in the swollen areas. You have heart, liver, or kidney disease and suddenly get edema. You have a fever and your symptoms suddenly get worse. These symptoms may be an emergency. Get help right away. Call 911. Do not wait to see if the symptoms will go away. Do not drive yourself to the hospital. Summary Edema is an abnormal buildup of fluids in the body tissues and under the skin. Eating too much salt (sodium)and being on your feet or sitting for a long time can cause edema in your legs, feet, and ankles. Raise (elevate) the  injured area above the level of your heart while you are sitting or lying down. Follow your health care provider's instructions about diet and how much fluid you can drink. This information is not intended to replace advice given to you by your health care provider. Make sure you discuss any questions you have with your health care provider. Document Revised: 03/13/2021 Document Reviewed: 03/13/2021 Elsevier Patient  Education  2023 [employer]  Fat and Cholesterol Restricted Eating Plan Getting too much fat and cholesterol in your diet may cause health problems. Choosing the right foods helps keep your fat and cholesterol at normal levels. This can keep you from getting certain diseases. Your doctor may recommend an eating plan that includes: Total fat: ______% or less of total calories a day. This is ______g of fat a day. Saturated fat: ______% or less of total calories a day. This is ______g of saturated fat a day. Cholesterol: less than _________mg a day. Fiber: ______g a day. What are tips for following this plan? General tips Work with your doctor to lose weight if you need to. Avoid: Foods with added sugar. Fried foods. Foods with trans fat or partially hydrogenated oils. This includes some margarines and baked goods. If you drink alcohol: Limit how much you have to: 0-1 drink a day for women who are not pregnant. 0-2 drinks a day for men. Know how much alcohol is in a drink. In the [place] one drink equals one 12 oz bottle of beer (355 mL), one 5 oz glass of wine (148 mL), or one 1 oz glass of hard liquor (44 mL). Reading food labels Check food labels for: Trans fats. Partially hydrogenated oils. Saturated fat (g) in each serving. Cholesterol (mg) in each serving. Fiber (g) in each serving. Choose foods with healthy fats, such as: Monounsaturated fats and polyunsaturated fats. These include olive and canola oil, flaxseeds, walnuts, almonds, and seeds. Omega-3 fats. These are found in certain fish, flaxseed oil, and ground flaxseeds. Choose grain products that have whole grains. Look for the word "whole" as the first word in the ingredient list. Cooking Cook foods using low-fat methods. These include baking, boiling, grilling, and broiling. Eat more home-cooked foods. Eat at restaurants and buffets less often. Eat less fast food. Avoid cooking using saturated fats, such as butter,  cream, palm oil, palm kernel oil, and coconut oil. Meal planning  At meals, divide your plate into four equal parts: Fill one-half of your plate with vegetables, green salads, and fruit. Fill one-fourth of your plate with whole grains. Fill one-fourth of your plate with low-fat (lean) protein foods. Eat fish that is high in omega-3 fats at least two times a week. This includes mackerel, tuna, sardines, and salmon. Eat foods that are high in fiber, such as whole grains, beans, apples, pears, berries, broccoli, carrots, peas, and barley. What foods should I eat? Fruits All fresh, canned (in natural juice), or frozen fruits. Vegetables Fresh or frozen vegetables (raw, steamed, roasted, or grilled). Green salads. Grains Whole grains, such as whole wheat or whole grain breads, crackers, cereals, and pasta. Unsweetened oatmeal, bulgur, barley, quinoa, or brown rice. Corn or whole wheat flour tortillas. Meats and other protein foods Ground beef (85% or leaner), grass-fed beef, or beef trimmed of fat. Skinless chicken or [place]. Ground chicken or [place]. Pork trimmed of fat. All fish and seafood. Egg whites. Dried beans, peas, or lentils. Unsalted nuts or seeds. Unsalted canned beans. Nut butters without added sugar or oil. Dairy Low-fat  or nonfat dairy products, such as skim or 1% milk, 2% or reduced-fat cheeses, low-fat and fat-free ricotta or cottage cheese, or plain low-fat and nonfat yogurt. Fats and oils Tub margarine without trans fats. Light or reduced-fat mayonnaise and salad dressings. Avocado. Olive, canola, sesame, or safflower oils. The items listed above may not be a complete list of foods and beverages you can eat. Contact a dietitian for more information. What foods should I avoid? Fruits Canned fruit in heavy syrup. Fruit in cream or butter sauce. Fried fruit. Vegetables Vegetables cooked in cheese, cream, or butter sauce. Fried vegetables. Grains White bread. White pasta. White  rice. Cornbread. Bagels, pastries, and croissants. Crackers and snack foods that contain trans fat and hydrogenated oils. Meats and other protein foods Fatty cuts of meat. Ribs, chicken wings, bacon, sausage, bologna, salami, chitterlings, fatback, hot dogs, bratwurst, and packaged lunch meats. Liver and organ meats. Whole eggs and egg yolks. Chicken and [place] with skin. Fried meat. Dairy Whole or 2% milk, cream, half-and-half, and cream cheese. Whole milk cheeses. Whole-fat or sweetened yogurt. Full-fat cheeses. Nondairy creamers and whipped toppings. Processed cheese, cheese spreads, and cheese curds. Fats and oils Butter, stick margarine, lard, shortening, ghee, or bacon fat. Coconut, palm kernel, and palm oils. Beverages Alcohol. Sugar-sweetened drinks such as sodas, lemonade, and fruit drinks. Sweets and desserts Corn syrup, sugars, honey, and molasses. Candy. Jam and jelly. Syrup. Sweetened cereals. Cookies, pies, cakes, donuts, muffins, and ice cream. The items listed above may not be a complete list of foods and beverages you should avoid. Contact a dietitian for more information. Summary Choosing the right foods helps keep your fat and cholesterol at normal levels. This can keep you from getting certain diseases. At meals, fill one-half of your plate with vegetables, green salads, and fruits. Eat high fiber foods, like whole grains, beans, apples, pears, berries, carrots, peas, and barley. Limit added sugar, saturated fats, alcohol, and fried foods. This information is not intended to replace advice given to you by your health care provider. Make sure you discuss any questions you have with your health care provider. Document Revised: 11/18/2020 Document Reviewed: 11/18/2020 Elsevier Patient Education  2023 [employer].

## 2022-03-30 ENCOUNTER — Encounter: Payer: Self-pay | Admitting: Nurse Practitioner

## 2022-03-30 ENCOUNTER — Ambulatory Visit (INDEPENDENT_AMBULATORY_CARE_PROVIDER_SITE_OTHER): Payer: Medicaid Other | Admitting: Nurse Practitioner

## 2022-03-30 ENCOUNTER — Other Ambulatory Visit (INDEPENDENT_AMBULATORY_CARE_PROVIDER_SITE_OTHER): Payer: Medicaid Other

## 2022-03-30 VITALS — BP 120/80 | HR 84 | Temp 98.5°F | Ht 63.5 in | Wt 319.6 lb

## 2022-03-30 DIAGNOSIS — F17210 Nicotine dependence, cigarettes, uncomplicated: Secondary | ICD-10-CM | POA: Diagnosis not present

## 2022-03-30 DIAGNOSIS — J4520 Mild intermittent asthma, uncomplicated: Secondary | ICD-10-CM

## 2022-03-30 DIAGNOSIS — R0609 Other forms of dyspnea: Secondary | ICD-10-CM | POA: Diagnosis not present

## 2022-03-30 DIAGNOSIS — G4733 Obstructive sleep apnea (adult) (pediatric): Secondary | ICD-10-CM

## 2022-03-30 DIAGNOSIS — R9389 Abnormal findings on diagnostic imaging of other specified body structures: Secondary | ICD-10-CM | POA: Insufficient documentation

## 2022-03-30 DIAGNOSIS — R0683 Snoring: Secondary | ICD-10-CM | POA: Insufficient documentation

## 2022-03-30 DIAGNOSIS — Z720 Tobacco use: Secondary | ICD-10-CM | POA: Insufficient documentation

## 2022-03-30 DIAGNOSIS — R002 Palpitations: Secondary | ICD-10-CM | POA: Insufficient documentation

## 2022-03-30 LAB — BASIC METABOLIC PANEL
BUN: 7 mg/dL (ref 6–23)
CO2: 24 mEq/L (ref 19–32)
Calcium: 9.1 mg/dL (ref 8.4–10.5)
Chloride: 103 mEq/L (ref 96–112)
Creatinine, Ser: 0.88 mg/dL (ref 0.40–1.20)
GFR: 85.93 mL/min (ref >60.00)
Glucose, Bld: 79 mg/dL (ref 70–99)
Potassium: 4 mEq/L (ref 3.5–5.1)
Sodium: 136 mEq/L (ref 135–145)

## 2022-03-30 MED ORDER — VARENICLINE TARTRATE 0.5 MG X 11 & 1 MG X 42 PO TBPK: ORAL_TABLET | ORAL | 0 refills | Status: DC

## 2022-03-30 NOTE — Progress Notes (Signed)
@Patient  ID: Deborah Andrews, female    DOB: 05/22/1986, 36 y.o.   MRN: [id number]  Chief Complaint  Patient presents with   sleep consult    Pt states that she has had sleep apnea since age 89. States she has had multiple sleep studies performed. Pt is not currently on CPAP.    Referring provider: Willow OraAndy, Camille L, MD  HPI: 36 year old female, active smoker referred for sleep consult. Past medical history significant for OSA, asthma, morbid obesity, depression. She has a family history of cancer (maternal grandfather, never smoker with lung cancer; mother, smoker with lung cancer; maternal great grandmother with brain cancer). She underwent genetic testing without any identifiable genetic link.  TEST/EVENTS:  10/27/2021 cardiac CT: calcified subcarinal lymph nodes, compatible with old granulomatous disease. There is a densely calcified 9x7 mm LLL nodule, compatible with benign granuloma. She has airway thinking bilaterally. Atelectasis in the RML with localized subpleural density, measuring up to 4 mm in thickness.   03/30/2022: Today - sleep consult Patient presents today for sleep consult referred by her PCP, Dr. Mardelle MatteAndy. She has a longstanding history with OSA; states that she has been told she has apnea every since she was a baby.  Last sleep study was done in [place].  She was started on CPAP therapy but struggled with tolerating it and felt like the pressures were too high.  They had told her they could not adjust them any further.  She recently had hysterectomy.  They had a lot of trouble during her procedure and after with keeping her oxygen saturations up.  Every time she would doze off she would stop breathing and her apnea alarm would go off as well as her oxygen dropped into the 70s to 80s.  She was strongly recommended to follow-up with a pulmonologist to discuss her sleep apnea. She has been told that she snores at night and stops breathing.  Wakes in the morning feeling poorly rested.   Feels fatigued throughout the day.  She will doze off easily when sitting and reading or watching TV.  Occasionally has a dull headache in the morning, which she is always attributed to sinuses.  Denies any sleep parasomnia/paralysis, sudden sleep attacks, cataplexy.  She has never fallen asleep while driving.  She will pull over or call her sister if she gets sleepy when driving. Goes to bed anywhere between 10-1 am.  Sometimes falls asleep quickly other times takes hours.  Wakes 2-4 times a night.  Officially gets out of bed in the morning around 5:30 AM.  Does not take anything to help her fall asleep at night.  She does drink caffeine throughout the day.  She has gained 70 pounds in 2 years.  Previously on phentermine when she lived in [place] and lost around 50 pounds.  She is an active smoker.  Previously smoked 3 to 4 packs a day.  Since moving here around 2 years ago, she has been able to cut back.  She was taking Chantix in July 2022 and had good success with this.  She was down to 2 to 3 cigarettes a day but then ran out and her pharmacy told her that her insurance would not cover a refill.  She has since picked back up and is smoking around a pack a day.  Rarely drinks alcohol. She has a family history of lung cancer, heart disease, brain cancer, asthma.  She has a history of irregular heartbeat.  She is not on  any antiarrhythmic medicines.  No history of stroke. She struggles with shortness of breath on a daily basis, which she thinks is related to her weight. She did feel better when she was down 50 lb. She has a history of mild asthma and uses as needed albuterol, which she uses a few times a week.  She recently had a cardiac CT 10/27/2021 which showed old granulomatous disease with calcified subcarinal lymph nodes and a densely calcified 9 x 7 mm left lower lobe nodule. She also had a pleural density, measuring 4 mm in the RML. She has no history of autoimmune disease. No recent infectious symptoms.  She has an occasional cough, which she attributes to postnasal drip and allergies. Usually dry but occasionally with clear phlegm. She does occasionally have swelling in her legs; is on lasix which helps. Echo from 07/2021 with EF 57%. No evidence of right heart strain. Unable to lay flat at night but this has been ongoing for as long as she can remember. Does occasionally have palpitations which has been evaluated by cardiology without significant findings. Denies wheezing, chest congestion, hemoptysis, fevers, night sweats, PND.   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 Valtrex prn outbreaks   Sleep apnea    done in [place] per pt, does not wear CPAP    Tobacco History: Social History   Tobacco Use  Smoking Status Every Day   Packs/day: 4.00   Years: 22.00   Total pack years: 88.00   Types: Cigarettes  Smokeless Tobacco Never  Tobacco Comments   Currently smoking 1ppd as of 03/30/22 ep   Ready to quit: Not Answered Counseling given: Not Answered Tobacco comments: Currently smoking 1ppd as of 03/30/22 ep   Outpatient Medications Prior to Visit  Medication Sig Dispense Refill   furosemide (LASIX) 20 MG tablet Take 1 tablet (20 mg total) by mouth daily as needed for edema. Take with potassium 30 tablet 3   loratadine (CLARITIN) 10 MG tablet Take 10 mg by mouth daily. Pt takes over the counter Aller-itin for allergies.     Moringa Oleifera (MORINGA PO) Take 1,000 mg by mouth daily. Moringa an over the counter multi vitamin  supplement.     Multiple Vitamin (MULTIVITAMIN) capsule Take 1 capsule by mouth daily.     nicotine (NICODERM CQ - DOSED IN MG/24 HOURS) 21 mg/24hr patch Place 1 patch (21 mg total) onto the skin daily. 28 patch 0   omeprazole (PRILOSEC) 20 MG capsule TAKE 1 CAPSULE(20 MG) BY MOUTH DAILY 90 capsule 3   potassium chloride SA (KLOR-CON M) 20 MEQ tablet Take 1 tablet (20 mEq total) by mouth daily. 30 tablet 3   VENTOLIN HFA 108 (90 Base) MCG/ACT inhaler INHALE 2 PUFFS INTO THE LUNGS EVERY 4 HOURS AS NEEDED FOR WHEEZING OR SHORTNESS OF BREATH 18 g 1   No facility-administered medications prior to visit.     Review of Systems:  Constitutional: No night sweats, fevers, chills. +fatigue, weight gain HEENT: No headaches, difficulty swallowing, tooth/dental problems, or sore throat. No sneezing, itching, ear ache. +nasal congestion, post nasal drip CV:  +palpitations, orthopnea, swelling in lower extremities. No chest pain, PND, anasarca, dizziness, syncope Resp: +snoring, nocturnal apneas; shortness of breath with exertion; occasional cough. No excess mucus or change in color of mucus.  No hemoptysis. No wheezing.  No chest wall deformity GI:  +heartburn/indigestion (improves with PPI). No abdominal pain, nausea, vomiting, diarrhea, change in bowel habits, loss of appetite, bloody stools.  GU: No dysuria, change in color of urine, urgency or frequency.  No flank pain, no hematuria  MSK:  No joint pain or swelling.  No decreased range of motion.  No back pain. Neuro: No dizziness or lightheadedness.  Psych: No depression or anxiety. Mood stable. Sleep disturbance    Physical Exam:  BP 120/80 (BP Location: Right Arm, Patient Position: Sitting, Cuff Size: Large)   Pulse 84   Temp 98.5 F (36.9 C) (Oral)   Ht 5' 3.5" (1.613 m)   Wt (!) 319 lb 9.6 oz (145 kg)   LMP 12/14/2021   SpO2 96% Comment: RA  BMI 55.73 kg/m   GEN: Pleasant, interactive, well-kempt; morbidly obese; in no acute  distress. HEENT:  Normocephalic and atraumatic. PERRLA. Sclera white. Nasal turbinates pink, moist and patent bilaterally. No rhinorrhea present. Oropharynx pink and moist, without exudate or edema. No lesions, ulcerations, or postnasal drip.  NECK:  Supple w/ fair ROM. No JVD present. Normal carotid impulses w/o bruits. Thyroid symmetrical with no goiter or nodules palpated. No lymphadenopathy.   CV: RRR, no m/r/g, no peripheral edema. Pulses intact, +2 bilaterally. No cyanosis, pallor or clubbing. PULMONARY:  Unlabored, regular breathing. Clear bilaterally A&P w/o wheezes/rales/rhonchi. No accessory muscle use. No dullness to percussion. GI: BS present and normoactive. Soft, non-tender to palpation. No organomegaly or masses detected. No CVA tenderness. MSK: No erythema, warmth or tenderness. Cap refil <2 sec all extrem. No deformities or joint swelling noted.  Neuro: A/Ox3. No focal deficits noted.   Skin: Warm, no lesions or rashe Psych: Normal affect and behavior. Judgement and thought content appropriate.     Lab Results:  CBC    Component Value Date/Time   WBC 23.4 (H) 01/09/2022 0019   RBC 4.71 01/09/2022 0019   HGB 14.1 01/09/2022 0019   HCT 43.4 01/09/2022 0019   PLT 448 (H) 01/09/2022 0019   MCV 92.1 01/09/2022 0019   MCH 29.9 01/09/2022 0019   MCHC 32.5 01/09/2022 0019   RDW 13.3 01/09/2022 0019   LYMPHSABS 3.5 10/13/2021 1238   MONOABS 0.6 10/13/2021 1238   EOSABS 0.3 10/13/2021 1238   BASOSABS 0.1 10/13/2021 1238    BMET    Component Value Date/Time   NA 136 03/30/2022 1019   K 4.0 03/30/2022 1019   CL 103 03/30/2022 1019   CO2 24 03/30/2022 1019   GLUCOSE 79 03/30/2022 1019   BUN 7 03/30/2022 1019   CREATININE 0.88 03/30/2022 1019   CALCIUM 9.1 03/30/2022 1019   GFRNONAA >60 01/09/2022 0220    BNP No results found for: "BNP"   Imaging:  No results found.        No data to display          No results found for:  "NITRICOXIDE"      Assessment & Plan:   OSA (obstructive sleep apnea) She has an extensive history of sleep apnea per her report, recent witness  apneas intra and postop, snoring, excessive daytime sleepiness, morning headaches, nighttime awakenings. BMI 55. Epworth 14-15. Given this,  I am concerned she still has sleep disordered breathing with obstructive sleep apnea. She will need urgent sleep study for further evaluation.    - discussed how weight can impact sleep and risk for sleep disordered breathing - discussed options to assist with weight loss: combination of diet modification, cardiovascular and strength training exercises   - had an extensive discussion regarding the adverse health consequences related to untreated sleep disordered breathing - specifically discussed the risks for hypertension, coronary artery disease, cardiac dysrhythmias, cerebrovascular disease, and diabetes - lifestyle modification discussed   - discussed how sleep disruption can increase risk of accidents, particularly when driving - safe driving practices were discussed  Patient Instructions  Continue Albuterol inhaler 2 puffs every 6 hours as needed for shortness of breath or wheezing. Notify if symptoms persist despite rescue inhaler/neb use. Continue claritin 1 tab daily for allergies   Given your history, you likely still having obstructive sleep apnea. I have ordered a home sleep study for further evaluation.   We discussed how untreated sleep apnea puts an individual at risk for cardiac arrhthymias, pulm HTN, DM, stroke and increases their risk for daytime accidents. We also briefly reviewed treatment options including weight loss, side sleeping position, oral appliance, CPAP therapy or referral to ENT for possible surgical options  We discussed smoking cessation today.  Goal quit date: 04/29/2022 Start Chantix pack as directed   CT chest w/ contrast. Someone will contact you for scheduling Labs  today prior to CT  Pulmonary function testing ordered today  Follow up after sleep study with Katie Chantia Amalfitano,NP to review results. If symptoms do not improve or worsen, please contact office for sooner follow up or seek emergency care.     Morbid obesity due to excess calories (HCC) Encouraged to work on healthy weight loss measures. She is going to discuss pharmacological therapy with her PCP. Advised her to notify us if she would like a referral to medical weight management as well.   Tobacco abuse Heavy smoker; previously smoking 3-4 packs a day, now 1 ppd. She had good success with Chantix in the past. We will restart this. Advised her to monitor her mood and notify if she has any significant mood changes.   The patient's current tobacco use: 1 ppd The patient was advised to quit and impact of smoking on their health.  I assessed the patient's willingness to attempt to quit. I provided methods and skills for cessation. We reviewed medication management of smoking session drugs if appropriate. Resources to help quit smoking were provided. A smoking cessation quit date was set: 04/29/2022 Follow-up was arranged in our clinic.  The amount of time spent counseling patient was 5 mins   Abnormal CT of the chest Calcified granulomatous disease; no hx of autoimmune disease per her knowledge. She was also noted to have a 4 mm pleural density in the RML. Given her smoking history and dyspnea, we will order dedicated CT chest w/ contrast for further evaluation.   Mild intermittent asthma without complication Unclear whether her dyspnea is related to poorly controlled asthma. Likely multifactorial related to morbid obesity, deconditioning, and severe untreated OSA. Lung exam clear today. She has not required abx or prednisone in the past 6 months-1 year for her breathing. Recommended she continue albuterol prn. We will obtain pulmonary function testing for further evaluation.   Palpitations Cardiac  exam unremarkable today.  Previous echo 07/2021 with low normal EF of 57%. She completed Zio monitor in January 2023 as well which overall showed NSR; she did have rare PVC's and rare supraventricular beats with a single 10 beat supraventricular run noted. Recommended she follow up with cardiology as scheduled.    I spent 62 minutes of dedicated to the care of this patient on the date of this encounter to include pre-visit review of records, face-to-face time with the patient discussing conditions above, post visit ordering of testing, clinical documentation with the electronic health record, making appropriate referrals as documented, and communicating necessary findings to members of the patients care team.  Noemi Chapel, NP 03/30/2022  Pt aware and understands NP's role.

## 2022-03-30 NOTE — Assessment & Plan Note (Signed)
Encouraged to work on healthy weight loss measures. She is going to discuss pharmacological therapy with her PCP. Advised her to notify us if she would like a referral to medical weight management as well.

## 2022-03-30 NOTE — Assessment & Plan Note (Addendum)
She has an extensive history of sleep apnea per her report, recent witness apneas intra and postop, snoring, excessive daytime sleepiness, morning headaches, nighttime awakenings. BMI 55. Epworth 14-15. Given this,  I am concerned she still has sleep disordered breathing with obstructive sleep apnea. She will need urgent sleep study for further evaluation.    - discussed how weight can impact sleep and risk for sleep disordered breathing - discussed options to assist with weight loss: combination of diet modification, cardiovascular and strength training exercises   - had an extensive discussion regarding the adverse health consequences related to untreated sleep disordered breathing - specifically discussed the risks for hypertension, coronary artery disease, cardiac dysrhythmias, cerebrovascular disease, and diabetes - lifestyle modification discussed   - discussed how sleep disruption can increase risk of accidents, particularly when driving - safe driving practices were discussed  Patient Instructions  Continue Albuterol inhaler 2 puffs every 6 hours as needed for shortness of breath or wheezing. Notify if symptoms persist despite rescue inhaler/neb use. Continue claritin 1 tab daily for allergies   Given your history, you likely still having obstructive sleep apnea. I have ordered a home sleep study for further evaluation.   We discussed how untreated sleep apnea puts an individual at risk for cardiac arrhthymias, pulm HTN, DM, stroke and increases their risk for daytime accidents. We also briefly reviewed treatment options including weight loss, side sleeping position, oral appliance, CPAP therapy or referral to ENT for possible surgical options  We discussed smoking cessation today.  Goal quit date: 04/29/2022 Start Chantix pack as directed   CT chest w/ contrast. Someone will contact you for scheduling Labs today prior to CT  Pulmonary function testing ordered today  Follow up  after sleep study with Katie Marce Charlesworth,NP to review results. If symptoms do not improve or worsen, please contact office for sooner follow up or seek emergency care.

## 2022-03-30 NOTE — Patient Instructions (Addendum)
Continue Albuterol inhaler 2 puffs every 6 hours as needed for shortness of breath or wheezing. Notify if symptoms persist despite rescue inhaler/neb use. Continue claritin 1 tab daily for allergies   Given your history, you likely still having obstructive sleep apnea. I have ordered a home sleep study for further evaluation.   We discussed how untreated sleep apnea puts an individual at risk for cardiac arrhthymias, pulm HTN, DM, stroke and increases their risk for daytime accidents. We also briefly reviewed treatment options including weight loss, side sleeping position, oral appliance, CPAP therapy or referral to ENT for possible surgical options  We discussed smoking cessation today.  Goal quit date: 04/29/2022 Start Chantix pack as directed   CT chest w/ contrast. Someone will contact you for scheduling Labs today prior to CT  Pulmonary function testing ordered today  Follow up after sleep study with Katie Treston Coker,NP to review results. If symptoms do not improve or worsen, please contact office for sooner follow up or seek emergency care.

## 2022-03-30 NOTE — Assessment & Plan Note (Signed)
Heavy smoker; previously smoking 3-4 packs a day, now 1 ppd. She had good success with Chantix in the past. We will restart this. Advised her to monitor her mood and notify if she has any significant mood changes.   The patient's current tobacco use: 1 ppd The patient was advised to quit and impact of smoking on their health.  I assessed the patient's willingness to attempt to quit. I provided methods and skills for cessation. We reviewed medication management of smoking session drugs if appropriate. Resources to help quit smoking were provided. A smoking cessation quit date was set: 04/29/2022 Follow-up was arranged in our clinic.  The amount of time spent counseling patient was 5 mins

## 2022-03-30 NOTE — Assessment & Plan Note (Signed)
Calcified granulomatous disease; no hx of autoimmune disease per her knowledge. She was also noted to have a 4 mm pleural density in the RML. Given her smoking history and dyspnea, we will order dedicated CT chest w/ contrast for further evaluation.

## 2022-03-30 NOTE — Assessment & Plan Note (Signed)
Cardiac exam unremarkable today. Previous echo 07/2021 with low normal EF of 57%. She completed Zio monitor in January 2023 as well which overall showed NSR; she did have rare PVC's and rare supraventricular beats with a single 10 beat supraventricular run noted. Recommended she follow up with cardiology as scheduled.

## 2022-03-30 NOTE — Assessment & Plan Note (Signed)
Unclear whether her dyspnea is related to poorly controlled asthma. Likely multifactorial related to morbid obesity, deconditioning, and severe untreated OSA. Lung exam clear today. She has not required abx or prednisone in the past 6 months-1 year for her breathing. Recommended she continue albuterol prn. We will obtain pulmonary function testing for further evaluation.

## 2022-03-30 NOTE — Progress Notes (Signed)
Reviewed and agree with assessment/plan.   Jacorey Donaway, MD [facility] 03/30/2022, 1:51 PM Pager:  [phone]  

## 2022-04-03 ENCOUNTER — Ambulatory Visit (HOSPITAL_COMMUNITY)
Admission: RE | Admit: 2022-04-03 | Discharge: 2022-04-03 | Disposition: A | Discharge status: Home / Self Care | Payer: Medicaid Other | Source: Ambulatory Visit | Attending: Nurse Practitioner | Admitting: Nurse Practitioner

## 2022-04-03 DIAGNOSIS — R0609 Other forms of dyspnea: Secondary | ICD-10-CM

## 2022-04-10 ENCOUNTER — Ambulatory Visit (HOSPITAL_COMMUNITY)
Admission: RE | Admit: 2022-04-10 | Discharge: 2022-04-10 | Disposition: A | Discharge status: Home / Self Care | Payer: Medicaid Other | Source: Ambulatory Visit | Attending: Nurse Practitioner | Admitting: Nurse Practitioner

## 2022-04-10 DIAGNOSIS — R0600 Dyspnea, unspecified: Secondary | ICD-10-CM | POA: Diagnosis not present

## 2022-04-10 DIAGNOSIS — J439 Emphysema, unspecified: Secondary | ICD-10-CM | POA: Diagnosis not present

## 2022-04-10 DIAGNOSIS — R0609 Other forms of dyspnea: Secondary | ICD-10-CM | POA: Diagnosis present

## 2022-04-10 MED ORDER — IOHEXOL 350 MG/ML SOLN
100.0000 mL | Freq: Once | INTRAVENOUS | Status: AC | PRN
  Administered 2022-04-10: 60 mL via INTRAVENOUS

## 2022-04-13 ENCOUNTER — Other Ambulatory Visit: Payer: Self-pay | Admitting: Family Medicine

## 2022-04-20 ENCOUNTER — Ambulatory Visit (INDEPENDENT_AMBULATORY_CARE_PROVIDER_SITE_OTHER): Payer: Medicaid Other | Admitting: Family Medicine

## 2022-04-20 ENCOUNTER — Encounter: Payer: Self-pay | Admitting: Family Medicine

## 2022-04-20 DIAGNOSIS — F17210 Nicotine dependence, cigarettes, uncomplicated: Secondary | ICD-10-CM | POA: Diagnosis not present

## 2022-04-20 DIAGNOSIS — R609 Edema, unspecified: Secondary | ICD-10-CM | POA: Diagnosis not present

## 2022-04-20 MED ORDER — PHENTERMINE HCL 37.5 MG PO TABS: 37.5000 mg | ORAL_TABLET | Freq: Every day | ORAL | 1 refills | Status: DC

## 2022-04-20 NOTE — Progress Notes (Signed)
Subjective  CC:  Chief Complaint  Patient presents with   Weight Loss    Pt here to f/U with Wt loss and also wants to be tested for pots    HPI: Deborah Andrews is a 37 y.o. female who presents to the office today to address the problems listed above in the chief complaint. Morbid obesity: She is ready to work on losing weight.  She has used phentermine in the past and requests to use again.  She lost 40 pounds on it.  No side effects.  No contraindications. Dependent edema on Lasix and potassium: Now well controlled.  No adverse effects. Smoking cessation: Now on Chantix.  Working to stop Assessment  1. Morbid obesity due to excess calories ([place])   2. Dependent edema   3. Cigarette nicotine dependence without complication      Plan  Obesity: Start phentermine, risk benefits discussed. Continue Lasix and potassium for extremity edema.  Weight loss will also help. Continue Chantix  Follow up: March for complete physical Visit date not found  No orders of the defined types were placed in this encounter.  Meds ordered this encounter  Medications   phentermine (ADIPEX-P) 37.5 MG tablet    Sig: Take 1 tablet (37.5 mg total) by mouth daily before breakfast.    Dispense:  90 tablet    Refill:  1      I reviewed the patients updated PMH, FH, and SocHx.    Patient Active Problem List   Diagnosis Date Noted   At high risk for breast cancer 08/04/2020    Priority: High   Cigarette nicotine dependence without complication 68/34/1960    Priority: High   Morbid obesity due to excess calories ([place]) 06/07/2020    Priority: High   Depression, major, recurrent, in complete remission ([place]) 06/07/2020    Priority: High   Menorrhagia with regular cycle 11/09/2020    Priority: Medium    Dysplasia of cervix, low grade (CIN 1) 11/09/2020    Priority: Medium    Mild intermittent asthma without complication 22/97/9892    Priority: Medium    Herpes simplex 12/05/2020    Priority:  Low   Genetic testing 07/27/2020    Priority: Low   Dependent edema 04/20/2022   OSA (obstructive sleep apnea) 03/30/2022   Tobacco abuse 03/30/2022   Abnormal CT of the chest 03/30/2022   Palpitations 03/30/2022   S/P vaginal hysterectomy 01/02/2022   Syncope 2022 08/03/2021   Pelvic pain in female 11/09/2020   Current Meds  Medication Sig   furosemide (LASIX) 20 MG tablet Take 1 tablet (20 mg total) by mouth daily as needed for edema. Take with potassium   loratadine (CLARITIN) 10 MG tablet Take 10 mg by mouth daily. Pt takes over the counter Aller-itin for allergies.   Moringa Oleifera (MORINGA PO) Take 1,000 mg by mouth daily. Moringa an over the counter multi vitamin supplement.   Multiple Vitamin (MULTIVITAMIN) capsule Take 1 capsule by mouth daily.   nicotine (NICODERM CQ - DOSED IN MG/24 HOURS) 21 mg/24hr patch Place 1 patch (21 mg total) onto the skin daily.   omeprazole (PRILOSEC) 20 MG capsule TAKE 1 CAPSULE(20 MG) BY MOUTH DAILY   phentermine (ADIPEX-P) 37.5 MG tablet Take 1 tablet (37.5 mg total) by mouth daily before breakfast.   potassium chloride SA (KLOR-CON M) 20 MEQ tablet Take 1 tablet (20 mEq total) by mouth daily.   varenicline (CHANTIX) 1 MG tablet TAKE 1 TABLET BY MOUTH TWICE  DAILY   VENTOLIN HFA 108 (90 Base) MCG/ACT inhaler INHALE 2 PUFFS INTO THE LUNGS EVERY 4 HOURS AS NEEDED FOR WHEEZING OR SHORTNESS OF BREATH    Allergies: Patient has No Known Allergies. Family History: Patient family history includes Breast cancer in her mother; Breast cancer (age of onset: 62) in her maternal uncle; Cancer in her maternal grandfather; Cancer (age of onset: 46) in her mother; Cancer - Other (age of onset: 38) in her mother; Cervical cancer (age of onset: 87) in her half-sister; Diabetes in her father and mother; Hypertension in her mother; Leukemia (age of onset: 2) in her half-brother; Melanoma in her maternal grandfather and maternal grandmother; Skin cancer in her  maternal aunt. Social History:  Patient  reports that she has been smoking cigarettes. She has a 88.00 pack-year smoking history. She has never used smokeless tobacco. She reports current alcohol use. She reports that she does not use drugs.  Review of Systems: Constitutional: Negative for fever malaise or anorexia Cardiovascular: negative for chest pain Respiratory: negative for SOB or persistent cough Gastrointestinal: negative for abdominal pain  Objective  Vitals: BP 110/64   Pulse 91   Temp 98.3 F (36.8 C)   Ht 5' 3.5" (1.613 m)   Wt (!) 323 lb (146.5 kg)   LMP 12/14/2021   SpO2 94%   BMI 56.32 kg/m  General: no acute distress , A&Ox3 Cardiovascular:  RRR without murmur or gallop.  Respiratory:  Good breath sounds bilaterally, CTAB with normal respiratory effort    Commons side effects, risks, benefits, and alternatives for medications and treatment plan prescribed today were discussed, and the patient expressed understanding of the given instructions. Patient is instructed to call or message via MyChart if he/she has any questions or concerns regarding our treatment plan. No barriers to understanding were identified. We discussed Red Flag symptoms and signs in detail. Patient expressed understanding regarding what to do in case of urgent or emergency type symptoms.  Medication list was reconciled, printed and provided to the patient in AVS. Patient instructions and summary information was reviewed with the patient as documented in the AVS. This note was prepared with assistance of Dragon voice recognition software. Occasional wrong-word or sound-a-like substitutions may have occurred due to the inherent limitations of voice recognition software  This visit occurred during the SARS-CoV-2 public health emergency.  Safety protocols were in place, including screening questions prior to the visit, additional usage of staff PPE, and extensive cleaning of exam room while observing  appropriate contact time as indicated for disinfecting solutions.

## 2022-04-20 NOTE — Patient Instructions (Signed)
Please return in 6 months for your annual complete physical; please come fasting.   If you have any questions or concerns, please don't hesitate to send me a message via MyChart or call the office at [phone]. Thank you for visiting with us today! It's our pleasure caring for you.  

## 2022-04-24 NOTE — Progress Notes (Signed)
Please notify patient CT chest showed changes consistent with smoking related bronchiolitis. We started her on Chantix at her last visit; see how smoking cessation is going. We can discuss CT further at her follow up.

## 2022-04-24 NOTE — Progress Notes (Signed)
Called and spoke with patient, advised of results/recommendations per Marland Kitchen NP.  She verbalized understanding.  She stated that her medicaid would not cover the Chantix starter pack and that anyone that has Medicaid should not be ordered the starter pack, just order the tablets.  She just recently received the Chantix and started taking it.  Nothing further needed.

## 2022-05-17 ENCOUNTER — Ambulatory Visit (INDEPENDENT_AMBULATORY_CARE_PROVIDER_SITE_OTHER): Payer: Medicaid Other

## 2022-05-17 DIAGNOSIS — G4733 Obstructive sleep apnea (adult) (pediatric): Secondary | ICD-10-CM

## 2022-05-18 ENCOUNTER — Telehealth: Payer: Self-pay | Admitting: Nurse Practitioner

## 2022-05-18 NOTE — Telephone Encounter (Signed)
Spoke to pt.  Pt is bringing in the [place] for me to look at.  Pt will repeat test this weekend if necessary.  Nothing further needed at this time.

## 2022-05-21 ENCOUNTER — Ambulatory Visit (INDEPENDENT_AMBULATORY_CARE_PROVIDER_SITE_OTHER): Payer: Medicaid Other | Admitting: Pulmonary Disease

## 2022-05-21 DIAGNOSIS — R0609 Other forms of dyspnea: Secondary | ICD-10-CM | POA: Diagnosis not present

## 2022-05-21 LAB — PULMONARY FUNCTION TEST
DL/VA % pred: 74 %
DL/VA: 3.41 ml/min/mmHg/L
DLCO cor % pred: 68 %
DLCO cor: 14.68 ml/min/mmHg
DLCO unc % pred: 68 %
DLCO unc: 14.68 ml/min/mmHg
FEF 25-75 Post: 3.42 L/sec
FEF 25-75 Pre: 3.05 L/sec
FEF2575-%Change-Post: 11 %
FEF2575-%Pred-Post: 104 %
FEF2575-%Pred-Pre: 93 %
FEV1-%Change-Post: 3 %
FEV1-%Pred-Post: 92 %
FEV1-%Pred-Pre: 89 %
FEV1-Post: 2.81 L
FEV1-Pre: 2.73 L
FEV1FVC-%Change-Post: 1 %
FEV1FVC-%Pred-Pre: 100 %
FEV6-%Change-Post: 2 %
FEV6-%Pred-Post: 92 %
FEV6-%Pred-Pre: 90 %
FEV6-Post: 3.32 L
FEV6-Pre: 3.24 L
FEV6FVC-%Pred-Post: 101 %
FEV6FVC-%Pred-Pre: 101 %
FVC-%Change-Post: 1 %
FVC-%Pred-Post: 91 %
FVC-%Pred-Pre: 89 %
FVC-Post: 3.32 L
FVC-Pre: 3.26 L
Post FEV1/FVC ratio: 85 %
Post FEV6/FVC ratio: 100 %
Pre FEV1/FVC ratio: 84 %
Pre FEV6/FVC Ratio: 100 %
RV % pred: 105 %
RV: 1.5 L
TLC % pred: 97 %
TLC: 4.78 L

## 2022-05-21 NOTE — Progress Notes (Signed)
PFT done today. 

## 2022-06-11 NOTE — Progress Notes (Signed)
Please schedule OV (new pt 30 min slot) with either Dr. Craige Cotta, Dr. Vassie Loll or Dr. Wynona Neat to review PFTs and sleep study, once completed.   Her PFTs showed normal lung function and volumes. She has a mild decline in her diffusion capacity, which can be discussed further at her office visit. Thanks.

## 2022-06-18 ENCOUNTER — Telehealth: Payer: Self-pay | Admitting: Nurse Practitioner

## 2022-06-18 NOTE — Telephone Encounter (Signed)
Called patient but she did not answer. Left message for her to call us back.  

## 2022-07-18 ENCOUNTER — Encounter: Payer: Self-pay | Admitting: Pulmonary Disease

## 2022-07-18 ENCOUNTER — Ambulatory Visit (INDEPENDENT_AMBULATORY_CARE_PROVIDER_SITE_OTHER): Payer: Medicaid Other | Admitting: Pulmonary Disease

## 2022-07-18 VITALS — BP 132/82 | HR 85 | Temp 98.0°F | Ht 63.0 in | Wt 312.6 lb

## 2022-07-18 DIAGNOSIS — R0609 Other forms of dyspnea: Secondary | ICD-10-CM

## 2022-07-18 NOTE — Progress Notes (Signed)
Deborah Andrews    [id number]    1985-12-09  Primary Care Physician:Andy, Malachi Bonds, MD  Referring Physician: Willow Ora, MD [address] [place],  [place] [address]  Chief complaint:   Patient being seen for sleep disordered breathing   HPI:  She states she has had sleep issues since about the age of 9 She was told during the last sleep study that she just stops breathing She also notices pauses in her breathing when she is awake  She stated she was told she did not have obstructive sleep apnea  History of asthma, obesity, depression  Mother had lung cancer She is an active smoker  CT with calcified left lower lobe nodule-likely granulomatous change  She was started on CPAP recently but struggled with using it she felt the pressure was too high even at the lowest pressure She does have a history of snoring, excessive daytime sleepiness  No sleepy driving No parasomnias  Falls asleep quickly 2-4 awakenings with about 6 to 7 hours of sleep nightly  Caffeinated beverages during the day  She has lost weight recently  Continues to smoke about 1 pack of cigarettes a day  Outpatient Encounter Medications as of 07/18/2022  Medication Sig   furosemide (LASIX) 20 MG tablet Take 1 tablet (20 mg total) by mouth daily as needed for edema. Take with potassium   loratadine (CLARITIN) 10 MG tablet Take 10 mg by mouth daily. Pt takes over the counter Aller-itin for allergies.   Multiple Vitamin (MULTIVITAMIN) capsule Take 1 capsule by mouth daily.   nicotine (NICODERM CQ - DOSED IN MG/24 HOURS) 21 mg/24hr patch Place 1 patch (21 mg total) onto the skin daily.   omeprazole (PRILOSEC) 20 MG capsule TAKE 1 CAPSULE(20 MG) BY MOUTH DAILY   phentermine (ADIPEX-P) 37.5 MG tablet Take 1 tablet (37.5 mg total) by mouth daily before breakfast.   potassium chloride SA (KLOR-CON M) 20 MEQ tablet Take 1 tablet (20 mEq total) by mouth daily.   varenicline (CHANTIX) 1 MG  tablet TAKE 1 TABLET BY MOUTH TWICE DAILY   VENTOLIN HFA 108 (90 Base) MCG/ACT inhaler INHALE 2 PUFFS INTO THE LUNGS EVERY 4 HOURS AS NEEDED FOR WHEEZING OR SHORTNESS OF BREATH   Moringa Oleifera (MORINGA PO) Take 1,000 mg by mouth daily. Moringa an over the counter multi vitamin supplement. (Patient not taking: Reported on 07/18/2022)   No facility-administered encounter medications on file as of 07/18/2022.    Allergies as of 07/18/2022   (No Known Allergies)    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 Valtrex prn outbreaks   Sleep apnea    done in [place] per pt, does not wear CPAP    Past Surgical History:  Procedure Laterality Date   CERVICAL CONE BIOPSY     CESAREAN SECTION     x2   CHOLECYSTECTOMY     ENDOMETRIAL BIOPSY     TONSILLECTOMY     TUBAL LIGATION     VAGINAL  HYSTERECTOMY Bilateral 01/02/2022   Procedure: HYSTERECTOMY VAGINAL WITH SALPINGECTOMY;  Surgeon: Warden Fillers, MD;  Location: [facility] OR;  Service: Gynecology;  Laterality: Bilateral;   WISDOM TOOTH EXTRACTION      Family History  Problem Relation Age of Onset   Diabetes Mother    Hypertension Mother    Breast cancer Mother        dx before age 65   Cancer Mother 18       Gyn. ?cervical ?ovarian    Cancer - Other Mother 67       unknown primary. ?kidney   Diabetes Father    Breast cancer Maternal Uncle 59       female breast cancer   Cervical cancer Half-Sister 22       maternal half sister   Leukemia Half-Brother 2       maternal half brother   Skin cancer Maternal Aunt        x3   Melanoma Maternal Grandmother    Melanoma Maternal Grandfather     Cancer Maternal Grandfather        unknown type    Social History   Socioeconomic History   Marital status: Significant Other    Spouse name: boyfriend   Number of children: Not on file   Years of education: Not on file   Highest education level: Not on file  Occupational History   Not on file  Tobacco Use   Smoking status: Every Day    Packs/day: 4.00    Years: 22.00    Total pack years: 88.00    Types: Cigarettes   Smokeless tobacco: Never   Tobacco comments:    Currently smoking 1ppd as of 03/30/22 ep  Vaping Use   Vaping Use: Never used  Substance and Sexual Activity   Alcohol use: Yes    Comment: occ- on holidays   Drug use: Never   Sexual activity: Yes    Birth control/protection: Surgical  Other Topics Concern   Not on file  Social History Narrative   Not on file   Social Determinants of Health   Financial Resource Strain: Not on file  Food Insecurity: Food Insecurity Present (02/12/2022)   Hunger Vital Sign    Worried About Running Out of Food in the Last Year: Sometimes true    Ran Out of Food in the Last Year: Sometimes true  Transportation Needs: No Transportation Needs (02/12/2022)   PRAPARE - Administrator, Civil Service (Medical): No    Lack of Transportation (Non-Medical): No  Physical Activity: Not on file  Stress: Not on file  Social Connections: Not on file  Intimate Partner Violence: Not on file    Review of Systems  Respiratory:  Positive for shortness of breath.   Psychiatric/Behavioral:  Positive for sleep disturbance.     Vitals:   07/18/22 1037  BP: 132/82  Pulse: 85  Temp: 98 F (36.7 C)  SpO2: 98%     Physical Exam Constitutional:      Appearance: She is obese.  HENT:     Nose: Nose normal.     Mouth/Throat:     Mouth: Mucous membranes are moist.  Eyes:     General: No scleral icterus. Cardiovascular:     Rate and Rhythm: Normal rate and regular rhythm.     Heart sounds: No murmur heard.    No friction  rub.  Pulmonary:     Effort: No respiratory distress.     Breath  sounds: No stridor. No wheezing or rhonchi.  Musculoskeletal:     Cervical back: No rigidity.  Neurological:     Mental Status: She is alert.  Psychiatric:        Mood and Affect: Mood normal.     Data Reviewed: Positive studies not available  She is scheduled for home sleep study result not available  Past CT scan of the chest with some calcified adenopathy  Assessment:  Sleep disordered breathing  Excessive daytime sleepiness  Nonrestorative sleep  Active smoker  Pathophysiology of sleep disordered breathing discussed with patient Treatment options discussed with the patient  Plan/Recommendations: Try and locate the patient's home sleep study  Patient will need an in lab study to differentiate between central and obstructive apneas  Encouraged to continue to work on quitting smoking  Continue working on weight loss efforts  Tentative follow-up in 4 to 6 weeks to follow-up on study     Virl Diamond MD [facility] 07/18/2022, 8:58 PM  CC: Willow Ora, MD

## 2022-07-18 NOTE — Patient Instructions (Signed)
I apologize for not-your sleep study -I do not see it in the system so far  We will at some point need to get a sleep study in the sleep lab because of the history provided that you did not have obstructive sleep apnea in previous study and you had difficulty with the machine at lower pressures  Continue to work on quitting smoking  Continue to work on weight loss  We will try and track down the sleep study and update you with result as soon as able  We will give you an appointment in about 4 to 6 weeks -This is to make sure that we follow-up -If after reviewing the sleep study, next step is the sleep study in the sleep lab-next appointment will definitely be after that study is done

## 2022-07-26 NOTE — Progress Notes (Addendum)
 " Cardiology Office Note:    Date:  07/30/2022   ID:  Deborah Andrews, DOB 1985-12-22, MRN [id number]  PCP:  Jodie Lavern CROME, MD   [facility] Providers Cardiologist:  Wendel Haws, MD Referring MD: Jodie Lavern CROME, MD   Chief Complaint/Reason for Referral: Syncope and palpitations  ASSESSMENT:    1. Precordial pain   2. BMI 50.0-59.9, adult (HCC)   3. Palpitations      PLAN:    In order of problems listed above: Chest pain:  Reassuring coronary CTA and echocardiogram.  LDL March 2023 114.  She still has atypical chest pain but it was responsive to the nitroglycerin  she took for her coronary CTA scan.  We will start Imdur  30 mg to see if this helps by empirically treating for coronary vasospasm..  If it does not we could consider a PET stress test for microvascular dysfunction. Elevated BMI: I discussed referral to pharmacy for pharmacotherapy with the patient is reluctant to be administered injectables due to fear of needles. 3.  Palpitations: Will obtain a monitor for to evaluate further.        Dispo:  Return in about 6 months (around 01/28/2023).      Medication Adjustments/Labs and Tests Ordered: Current medicines are reviewed at length with the patient today.  Concerns regarding medicines are outlined above.   Tests Ordered: Orders Placed This Encounter  Procedures   LONG TERM MONITOR (3-14 DAYS)   EKG 12-Lead    Medication Changes: Meds ordered this encounter  Medications   isosorbide  mononitrate (IMDUR ) 30 MG 24 hr tablet    Sig: Take 1 tablet (30 mg total) by mouth at bedtime.    Dispense:  90 tablet    Refill:  3    History of Present Illness:    FOCUSED PROBLEM LIST:   1.  BMI 55 2.  Tobacco abuse 3.  Coronary CTA 2023 with normal coronaries and calcium score of 0  March 2023 consultation:  The patient is a 37 y.o. female with the indicated medical history here for recommendations regarding chest pain.  Patient has been seen by her primary care  provider and due to syncope had an echocardiogram which was normal and a monitor which was reassuring.  She reported atypical chest pain occurring at rest and lasting minutes.  She requests a cardiology referral.  She tells me at times she will get chest pain just sitting.  It can last several minutes and then goes away.  It feels like she cannot take a deep breath and that her heart is being trapped.  She occasionally gets fainting spells but tells me this usually happens when she is very hot.  Last time this happened she was at [employer] and had to sit down on the ground to regain her composure.  She denies any significant shortness of breath.  She has had a bit of peripheral edema.  She denies any paroxysmal nocturnal dyspnea, orthopnea.  She is required no emergency room visits or hospitalizations.  Unfortunately she continues to smoke but is intent on quitting.  Plan: Coronary CTA and echocardiogram.  Today:  In the interim, the patient had a very reassuring coronary CTA as detailed above and echocardiogram.  She underwent an uncomplicated hysterectomy in June.  She was seen by pulmonary for potential sleep apnea.  She tells me on the day that she had a coronary CTA she was prescribed nitroglycerin  and this really helped her chest pain symptoms.  She occasionally  gets chest pain still without any rhyme or reason.  This can happen at rest and with exertion.  She also has had palpitations.  This is being an ongoing issue for her and apparently had a monitor on for a long time when she was younger which showed no significant abnormalities per se.  She fortunately has not had any presyncope or syncope.  She unfortunately continues to smoke however but she is trying to quit.      Current Medications: Current Meds  Medication Sig   furosemide  (LASIX ) 20 MG tablet Take 1 tablet (20 mg total) by mouth daily as needed for edema. Take with potassium   isosorbide  mononitrate (IMDUR ) 30 MG 24 hr tablet Take 1  tablet (30 mg total) by mouth at bedtime.   loratadine (CLARITIN) 10 MG tablet Take 10 mg by mouth daily. Pt takes over the counter Aller-itin for allergies.   Multiple Vitamin (MULTIVITAMIN) capsule Take 1 capsule by mouth daily.   nicotine  (NICODERM CQ  - DOSED IN MG/24 HOURS) 21 mg/24hr patch Place 1 patch (21 mg total) onto the skin daily.   omeprazole  (PRILOSEC) 20 MG capsule TAKE 1 CAPSULE(20 MG) BY MOUTH DAILY   phentermine  (ADIPEX-P ) 37.5 MG tablet Take 1 tablet (37.5 mg total) by mouth daily before breakfast.   potassium chloride  SA (KLOR-CON  M) 20 MEQ tablet Take 1 tablet (20 mEq total) by mouth daily.   varenicline  (CHANTIX ) 1 MG tablet TAKE 1 TABLET BY MOUTH TWICE DAILY   VENTOLIN  HFA 108 (90 Base) MCG/ACT inhaler INHALE 2 PUFFS INTO THE LUNGS EVERY 4 HOURS AS NEEDED FOR WHEEZING OR SHORTNESS OF BREATH     Allergies:    Patient has no known allergies.   Social History:   Social History   Tobacco Use   Smoking status: Every Day    Packs/day: 4.00    Years: 22.00    Total pack years: 88.00    Types: Cigarettes   Smokeless tobacco: Never   Tobacco comments:    Currently smoking 1ppd as of 03/30/22 ep  Vaping Use   Vaping Use: Never used  Substance Use Topics   Alcohol use: Yes    Comment: occ- on holidays   Drug use: Never     Family Hx: Family History  Problem Relation Age of Onset   Diabetes Mother    Hypertension Mother    Breast cancer Mother        dx before age 30   Cancer Mother 36       Gyn. ?cervical ?ovarian    Cancer - Other Mother 45       unknown primary. ?kidney   Diabetes Father    Breast cancer Maternal Uncle 20       female breast cancer   Cervical cancer Half-Sister 37       maternal half sister   Leukemia Half-Brother 2       maternal half brother   Skin cancer Maternal Aunt        x3   Melanoma Maternal Grandmother    Melanoma Maternal Grandfather    Cancer Maternal Grandfather        unknown type     Review of Systems:   Please see  the history of present illness.    All other systems reviewed and are negative.     EKGs/Labs/Other Test Reviewed:    EKG: December 2022 sinus rhythm; EKG performed today demonstrates normal sinus rhythm with sinus arrhythmia.  Prior CV studies:  Cor CTA 2023: 1. Coronary calcium score of 0. 2. Normal coronary origin with right dominance. 3. Normal coronary arteries.   Monitor 2023 The basic rhythm is normal sinus with an average HR of 87 bpm No atrial fibrillation or flutter No high-grade heart block or pathologic pauses There are rare PVC's and rare supraventricular beats with a single 10 beat supraventricular run noted  TTE 2023 1. Left ventricular ejection fraction by 3D volume is 57 %. The left  ventricle has normal function. The left ventricle has no regional wall  motion abnormalities. Left ventricular diastolic parameters were normal.   2. Right ventricular systolic function is normal. The right ventricular  size is normal.   3. The mitral valve is normal in structure. Trivial mitral valve  regurgitation. No evidence of mitral stenosis.   4. The aortic valve is normal in structure. Aortic valve regurgitation is  not visualized. No aortic stenosis is present.   5. The inferior vena cava is normal in size with greater than 50%  respiratory variability, suggesting right atrial pressure of 3 mmHg.   Imaging studies that I have independently reviewed today: CT scans do not demonstrate aortic atheroslcerosis  Recent Labs: 10/13/2021: TSH 3.63 01/09/2022: ALT 14; Hemoglobin 14.1; Platelets 448 03/30/2022: BUN 7; Creatinine, Ser 0.88; Potassium 4.0; Sodium 136   Recent Lipid Panel Lab Results  Component Value Date/Time   CHOL 181 10/13/2021 12:38 PM   TRIG 101.0 10/13/2021 12:38 PM   HDL 47.30 10/13/2021 12:38 PM   LDLCALC 114 (H) 10/13/2021 12:38 PM    Risk Assessment/Calculations:          Physical Exam:    VS:  BP 110/75   Pulse 100   Ht 5' 3.5 (1.613 m)    Wt (!) 315 lb 12.8 oz (143.2 kg)   LMP 12/14/2021   SpO2 98%   BMI 55.06 kg/m       Wt Readings from Last 3 Encounters:  07/30/22 (!) 315 lb 12.8 oz (143.2 kg)  07/18/22 (!) 312 lb 9.6 oz (141.8 kg)  04/20/22 (!) 323 lb (146.5 kg)    GENERAL:  No apparent distress, Aox3, obese HEENT:  No carotid bruits, +2 carotid impulses, no scleral icterus CAR: RRR no murmurs, gallops, rubs, or thrills RES:  Clear to auscultation bilaterally ABD:  Soft, nontender, nondistended, positive bowel sounds x 4 VASC:  +2 radial pulses, +2 carotid pulses, palpable pedal pulses NEURO:  CN 2-12 grossly intact; motor and sensory grossly intact PSYCH:  No active depression or anxiety EXT:  No edema, ecchymosis, or cyanosis  Signed, Daune Divirgilio K Tine Mabee, MD  07/30/2022 8:31 AM    [facility] [address] [place], [place], [place]  [address] Phone: [phone]; Fax: [phone]   Note:  This document was prepared using Dragon voice recognition software and may include unintentional dictation errors. "

## 2022-07-30 ENCOUNTER — Ambulatory Visit: Payer: Medicaid Other | Attending: Internal Medicine | Admitting: Internal Medicine

## 2022-07-30 ENCOUNTER — Ambulatory Visit (INDEPENDENT_AMBULATORY_CARE_PROVIDER_SITE_OTHER): Payer: Medicaid Other

## 2022-07-30 ENCOUNTER — Encounter: Payer: Self-pay | Admitting: Internal Medicine

## 2022-07-30 VITALS — BP 110/75 | HR 100 | Ht 63.5 in | Wt 315.8 lb

## 2022-07-30 DIAGNOSIS — R002 Palpitations: Secondary | ICD-10-CM

## 2022-07-30 DIAGNOSIS — R072 Precordial pain: Secondary | ICD-10-CM

## 2022-07-30 DIAGNOSIS — Z6841 Body Mass Index (BMI) 40.0 and over, adult: Secondary | ICD-10-CM

## 2022-07-30 MED ORDER — ISOSORBIDE MONONITRATE ER 30 MG PO TB24: 30.0000 mg | ORAL_TABLET | Freq: Every day | ORAL | 3 refills | Status: DC

## 2022-07-30 NOTE — Progress Notes (Unsigned)
Enrolled for [employer] to mail a ZIO XT long term holter monitor to the patients address on file.  

## 2022-07-30 NOTE — Patient Instructions (Signed)
Medication Instructions:  START Isosorbide Mononitrate (Imdur) 30mg  each night at bedtime *If you need a refill on your cardiac medications before your next appointment, please call your pharmacy*   Lab Work: NONE If you have labs (blood work) drawn today and your tests are completely normal, you will receive your results only by: Brave (if you have MyChart) OR A paper copy in the mail If you have any lab test that is abnormal or we need to change your treatment, we will call you to review the results.   Testing/Procedures: 7-day Zio Monitor Your physician has recommended that you wear an event monitor. Event monitors are medical devices that record the heart's electrical activity. Doctors most often [place] these monitors to diagnose arrhythmias. Arrhythmias are problems with the speed or rhythm of the heartbeat. The monitor is a small, portable device. You can wear one while you do your normal daily activities. This is usually used to diagnose what is causing palpitations/syncope (passing out).   Follow-Up: At [facility], you and your health needs are our priority.  As part of our continuing mission to provide you with exceptional heart care, we have created designated Provider Care Teams.  These Care Teams include your primary Cardiologist (physician) and Advanced Practice Providers (APPs -  Physician Assistants and Nurse Practitioners) who all work together to provide you with the care you need, when you need it.  Your next appointment:   6 month(s)  The format for your next appointment:   In Person  Provider:   Lenna Sciara, MD  Other Instructions Bryn Gulling- Long Term Monitor Instructions  Your physician has requested you wear a ZIO patch monitor for 7 days.  This is a single patch monitor. [employer] supplies one patch monitor per enrollment. Additional stickers are not available. Please do not apply patch if you will be having a Nuclear Stress Test,   Echocardiogram, Cardiac CT, MRI, or Chest Xray during the period you would be wearing the  monitor. The patch cannot be worn during these tests. You cannot remove and re-apply the  ZIO XT patch monitor.  Your ZIO patch monitor will be mailed 3 day USPS to your address on file. It may take 3-5 days  to receive your monitor after you have been enrolled.  Once you have received your monitor, please review the enclosed instructions. Your monitor  has already been registered assigning a specific monitor serial # to you.  Billing and Patient Assistance Program Information  We have supplied [employer] with any of your insurance information on file for billing purposes. [employer] offers a sliding scale Patient Assistance Program for patients that do not have  insurance, or whose insurance does not completely cover the cost of the ZIO monitor.  You must apply for the Patient Assistance Program to qualify for this discounted rate.  To apply, please call [employer] at [phone], select option 4, select option 2, ask to apply for  Patient Assistance Program. Theodore Demark will ask your household income, and how many people  are in your household. They will quote your out-of-pocket cost based on that information.  [employer] will also be able to set up a 61-month, interest-free payment plan if needed.  Applying the monitor   Shave hair from upper left chest.  Hold abrader disc by orange tab. Rub abrader in 40 strokes over the upper left chest as  indicated in your monitor instructions.  Clean area with 4 enclosed alcohol pads. Let dry.  Apply patch as  indicated in monitor instructions. Patch will be placed under collarbone on left  side of chest with arrow pointing upward.  Rub patch adhesive wings for 2 minutes. Remove white label marked "1". Remove the white  label marked "2". Rub patch adhesive wings for 2 additional minutes.  While looking in a mirror, press and release button in center of patch. A small  green light will  flash 3-4 times. This will be your only indicator that the monitor has been turned on.  Do not shower for the first 24 hours. You may shower after the first 24 hours.  Press the button if you feel a symptom. You will hear a small click. Record Date, Time and  Symptom in the Patient Logbook.  When you are ready to remove the patch, follow instructions on the last 2 pages of Patient  Logbook. Stick patch monitor onto the last page of Patient Logbook.  Place Patient Logbook in the blue and white box. Use locking tab on box and tape box closed  securely. The blue and white box has prepaid postage on it. Please place it in the mailbox as  soon as possible. Your physician should have your test results approximately 7 days after the  monitor has been mailed back to [facility].  Call [facility] Customer Care at [phone] if you have questions regarding  your ZIO XT patch monitor. Call them immediately if you see an orange light blinking on your  monitor.  If your monitor falls off in less than 4 days, contact our Monitor department at [phone].  If your monitor becomes loose or falls off after 4 days call [employer] at [phone] for  suggestions on securing your monitor   Important Information About Sugar

## 2022-08-01 ENCOUNTER — Telehealth: Payer: Self-pay | Admitting: Pulmonary Disease

## 2022-08-01 DIAGNOSIS — R072 Precordial pain: Secondary | ICD-10-CM | POA: Diagnosis not present

## 2022-08-01 DIAGNOSIS — R002 Palpitations: Secondary | ICD-10-CM | POA: Diagnosis not present

## 2022-08-01 DIAGNOSIS — Z6841 Body Mass Index (BMI) 40.0 and over, adult: Secondary | ICD-10-CM

## 2022-08-01 NOTE — Telephone Encounter (Signed)
Called patient and she states that she has already done a home sleep test with [place] and we lost the results. She wants [place] to find her results and get back with her.   Can we find out where her home sleep test results are  Thank you

## 2022-08-02 NOTE — Telephone Encounter (Signed)
Deborah Andrews has printed the results and Dr Halford Chessman was going to read it

## 2022-08-13 ENCOUNTER — Ambulatory Visit: Payer: Medicaid Other | Admitting: Family Medicine

## 2022-08-20 ENCOUNTER — Encounter: Payer: Self-pay | Admitting: Family Medicine

## 2022-08-20 ENCOUNTER — Ambulatory Visit (INDEPENDENT_AMBULATORY_CARE_PROVIDER_SITE_OTHER): Payer: Medicaid Other | Admitting: Family Medicine

## 2022-08-20 VITALS — BP 120/88 | HR 98 | Temp 98.5°F | Ht 63.5 in | Wt 313.4 lb

## 2022-08-20 DIAGNOSIS — J069 Acute upper respiratory infection, unspecified: Secondary | ICD-10-CM | POA: Diagnosis not present

## 2022-08-20 DIAGNOSIS — R051 Acute cough: Secondary | ICD-10-CM | POA: Diagnosis not present

## 2022-08-20 DIAGNOSIS — J4520 Mild intermittent asthma, uncomplicated: Secondary | ICD-10-CM

## 2022-08-20 DIAGNOSIS — Z720 Tobacco use: Secondary | ICD-10-CM | POA: Diagnosis not present

## 2022-08-20 LAB — POCT INFLUENZA A/B: Influenza A, POC: NEGATIVE

## 2022-08-20 LAB — POC COVID19 BINAXNOW: SARS Coronavirus 2 Ag: NEGATIVE

## 2022-08-20 NOTE — Progress Notes (Signed)
Subjective  CC:  Chief Complaint  Patient presents with   Cough    Pt has been exposed to COVID. Coughing, headache and fever     HPI: Deborah Andrews is a 37 y.o. female who presents to the office today to address the problems listed above in the chief complaint. 2 days of uri sxs w/ fever to 102 responsive to antipyretics.  Dry cough, malaise. Multiple sick contacts. Children now starting with same. No sob, chest pain or gi sxs. +smoker. +asthma. No wheezing. Reviewed pulm notes and PFTs 05/2022: normal.   Assessment  1. Viral URI with cough   2. Acute cough   3. Mild intermittent asthma without complication   4. Tobacco abuse      Plan  Viral URI:  treat supportively. No asthma exacerbation at this time. Otc cough/could meds discussed. Treat fever and cough.  Follow up: as scheduled for cpe  10/18/2022  Orders Placed This Encounter  Procedures   POC COVID-19   POCT Influenza A/B   No orders of the defined types were placed in this encounter.     I reviewed the patients updated PMH, FH, and SocHx.    Patient Active Problem List   Diagnosis Date Noted   Tobacco abuse 03/30/2022    Priority: High   At high risk for breast cancer 08/04/2020    Priority: High   Cigarette nicotine dependence without complication 82/42/3536    Priority: High   Morbid obesity due to excess calories ([place]) 06/07/2020    Priority: High   Depression, major, recurrent, in complete remission ([place]) 06/07/2020    Priority: High   Menorrhagia with regular cycle 11/09/2020    Priority: Medium    Dysplasia of cervix, low grade (CIN 1) 11/09/2020    Priority: Medium    Mild intermittent asthma without complication 14/43/1540    Priority: Medium    Herpes simplex 12/05/2020    Priority: Low   Genetic testing 07/27/2020    Priority: Low   Viral URI with cough 08/20/2022   Dependent edema 04/20/2022   OSA (obstructive sleep apnea) 03/30/2022   Abnormal CT of the chest 03/30/2022    Palpitations 03/30/2022   S/P vaginal hysterectomy 01/02/2022   Syncope 2022 08/03/2021   Pelvic pain in female 11/09/2020   Current Meds  Medication Sig   furosemide (LASIX) 20 MG tablet Take 1 tablet (20 mg total) by mouth daily as needed for edema. Take with potassium   isosorbide mononitrate (IMDUR) 30 MG 24 hr tablet Take 1 tablet (30 mg total) by mouth at bedtime.   loratadine (CLARITIN) 10 MG tablet Take 10 mg by mouth daily. Pt takes over the counter Aller-itin for allergies.   Moringa Oleifera (MORINGA PO) Take 1,000 mg by mouth daily. Moringa an over the counter multi vitamin supplement.   Multiple Vitamin (MULTIVITAMIN) capsule Take 1 capsule by mouth daily.   nicotine (NICODERM CQ - DOSED IN MG/24 HOURS) 21 mg/24hr patch Place 1 patch (21 mg total) onto the skin daily.   omeprazole (PRILOSEC) 20 MG capsule TAKE 1 CAPSULE(20 MG) BY MOUTH DAILY   phentermine (ADIPEX-P) 37.5 MG tablet Take 1 tablet (37.5 mg total) by mouth daily before breakfast.   potassium chloride SA (KLOR-CON M) 20 MEQ tablet Take 1 tablet (20 mEq total) by mouth daily.   varenicline (CHANTIX) 1 MG tablet TAKE 1 TABLET BY MOUTH TWICE DAILY   VENTOLIN HFA 108 (90 Base) MCG/ACT inhaler INHALE 2 PUFFS INTO THE LUNGS  EVERY 4 HOURS AS NEEDED FOR WHEEZING OR SHORTNESS OF BREATH    Allergies: Patient has No Known Allergies. Family History: Patient family history includes Breast cancer in her mother; Breast cancer (age of onset: 2) in her maternal uncle; Cancer in her maternal grandfather; Cancer (age of onset: 66) in her mother; Cancer - Other (age of onset: 19) in her mother; Cervical cancer (age of onset: 34) in her half-sister; Diabetes in her father and mother; Hypertension in her mother; Leukemia (age of onset: 2) in her half-brother; Melanoma in her maternal grandfather and maternal grandmother; Skin cancer in her maternal aunt. Social History:  Patient  reports that she has been smoking cigarettes. She has a  88.00 pack-year smoking history. She has never used smokeless tobacco. She reports current alcohol use. She reports that she does not use drugs.  Review of Systems: Constitutional: Negative for fever malaise or anorexia Cardiovascular: negative for chest pain Respiratory: negative for SOB or persistent cough Gastrointestinal: negative for abdominal pain  Objective  Vitals: BP 120/88   Pulse 98   Temp 98.5 F (36.9 C)   Ht 5' 3.5" (1.613 m)   Wt (!) 313 lb 6.4 oz (142.2 kg)   LMP 12/14/2021   SpO2 97%   BMI 54.65 kg/m  General: no acute distress , A&Ox3 HEENT: PEERL, conjunctiva normal, neck is supple Cardiovascular:  RRR without murmur or gallop.  Respiratory:  Good breath sounds bilaterally, CTAB with normal respiratory effort Skin:  Warm, no rashes Office Visit on 08/20/2022  Component Date Value Ref Range Status   SARS Coronavirus 2 Ag 08/20/2022 Negative  Negative Final   Influenza A, POC 08/20/2022 Negative  Negative Final    Commons side effects, risks, benefits, and alternatives for medications and treatment plan prescribed today were discussed, and the patient expressed understanding of the given instructions. Patient is instructed to call or message via MyChart if he/she has any questions or concerns regarding our treatment plan. No barriers to understanding were identified. We discussed Red Flag symptoms and signs in detail. Patient expressed understanding regarding what to do in case of urgent or emergency type symptoms.  Medication list was reconciled, printed and provided to the patient in AVS. Patient instructions and summary information was reviewed with the patient as documented in the AVS. This note was prepared with assistance of Dragon voice recognition software. Occasional wrong-word or sound-a-like substitutions may have occurred due to the inherent limitations of voice recognition software

## 2022-08-20 NOTE — Patient Instructions (Signed)
Please follow up as scheduled for your next visit with me: 10/18/2022   If you have any questions or concerns, please don't hesitate to send me a message via MyChart or call the office at [phone]. Thank you for visiting with [place] today! It's our pleasure caring for you.

## 2022-08-21 ENCOUNTER — Ambulatory Visit: Payer: Medicaid Other | Admitting: Pulmonary Disease

## 2022-09-03 ENCOUNTER — Ambulatory Visit: Payer: Medicaid Other | Admitting: Pulmonary Disease

## 2022-09-18 ENCOUNTER — Encounter: Payer: Self-pay | Admitting: Pulmonary Disease

## 2022-09-18 ENCOUNTER — Ambulatory Visit (INDEPENDENT_AMBULATORY_CARE_PROVIDER_SITE_OTHER): Payer: Medicaid Other | Admitting: Pulmonary Disease

## 2022-09-18 VITALS — BP 132/80 | HR 92 | Ht 63.5 in | Wt 320.0 lb

## 2022-09-18 DIAGNOSIS — G4733 Obstructive sleep apnea (adult) (pediatric): Secondary | ICD-10-CM

## 2022-09-18 NOTE — Patient Instructions (Signed)
Schedule for in lab sleep study  Focus on weight loss efforts  Focus on trying to quit smoking  Regular exercises as tolerated  I will see you back in 3 to 4 months  Call with significant concerns  Your recent breathing study was normal  Your recent home sleep study did show 5 events an hour with low oxygen levels  Your recent echocardiogram was within normal limits

## 2022-09-18 NOTE — Progress Notes (Signed)
Deborah Andrews    DL:[id number]    Feb 03, 1986  Primary Care Physician:Andy, Karie Fetch, MD  Referring Physician: Leamon Arnt, [place],  [place] [address]  Chief complaint:   Patient being seen for sleep disordered breathing   HPI:  Continues to have difficulty with sleep Has been told about apneas in the past  Recently had a home sleep study which was negative for significant sleep apnea but did show moderate oxygen desaturations   History of asthma, obesity, depression  Mother had lung cancer She is an active smoker -She has cut down on smoking but not able to quit yet  CT with calcified left lower lobe nodule-likely granulomatous change  She was started on CPAP recently but struggled with using it she felt the pressure was too high even at the lowest pressure She does have a history of snoring, excessive daytime sleepiness  No sleepy driving No parasomnias  Falls asleep quickly 2-4 awakenings with about 6 to 7 hours of sleep nightly  Caffeinated beverages during the day  She has lost weight recently  Continues to smoke about 1 pack of cigarettes a day  Outpatient Encounter Medications as of 09/18/2022  Medication Sig   furosemide (LASIX) 20 MG tablet Take 1 tablet (20 mg total) by mouth daily as needed for edema. Take with potassium   isosorbide mononitrate (IMDUR) 30 MG 24 hr tablet Take 1 tablet (30 mg total) by mouth at bedtime.   loratadine (CLARITIN) 10 MG tablet Take 10 mg by mouth daily. Pt takes over the counter Aller-itin for allergies.   Moringa Oleifera (MORINGA PO) Take 1,000 mg by mouth daily. Moringa an over the counter multi vitamin supplement.   Multiple Vitamin (MULTIVITAMIN) capsule Take 1 capsule by mouth daily.   nicotine (NICODERM CQ - DOSED IN MG/24 HOURS) 21 mg/24hr patch Place 1 patch (21 mg total) onto the skin daily.   omeprazole (PRILOSEC) 20 MG capsule TAKE 1 CAPSULE(20 MG) BY MOUTH DAILY    phentermine (ADIPEX-P) 37.5 MG tablet Take 1 tablet (37.5 mg total) by mouth daily before breakfast.   potassium chloride SA (KLOR-CON M) 20 MEQ tablet Take 1 tablet (20 mEq total) by mouth daily.   varenicline (CHANTIX) 1 MG tablet TAKE 1 TABLET BY MOUTH TWICE DAILY   VENTOLIN HFA 108 (90 Base) MCG/ACT inhaler INHALE 2 PUFFS INTO THE LUNGS EVERY 4 HOURS AS NEEDED FOR WHEEZING OR SHORTNESS OF BREATH   No facility-administered encounter medications on file as of 09/18/2022.    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 Valtrex prn outbreaks   Sleep apnea    done in [place] per pt, does not wear CPAP    Past Surgical History:  Procedure Laterality Date   CERVICAL CONE BIOPSY     CESAREAN SECTION     x2   CHOLECYSTECTOMY     ENDOMETRIAL BIOPSY     TONSILLECTOMY  TUBAL LIGATION     VAGINAL HYSTERECTOMY Bilateral 01/02/2022   Procedure: HYSTERECTOMY VAGINAL WITH SALPINGECTOMY;  Surgeon: Griffin Basil, MD;  Location: ;  Service: Gynecology;  Laterality: Bilateral;   WISDOM TOOTH EXTRACTION      Family History  Problem Relation Age of Onset   Diabetes Mother    Hypertension Mother    Breast cancer Mother        dx before age 74   Cancer Mother 49       Gyn. ?cervical ?ovarian    Cancer - Other Mother 79       unknown primary. ?kidney   Diabetes Father    Breast cancer Maternal Uncle 48       female breast cancer   Cervical cancer Half-Sister 27       maternal half sister   Leukemia Half-Brother 2       maternal half brother   Skin cancer Maternal Aunt        x3   Melanoma  Maternal Grandmother    Melanoma Maternal Grandfather    Cancer Maternal Grandfather        unknown type    Social History   Socioeconomic History   Marital status: Significant Other    Spouse name: boyfriend   Number of children: Not on file   Years of education: Not on file   Highest education level: Not on file  Occupational History   Not on file  Tobacco Use   Smoking status: Every Day    Packs/day: 4.00    Years: 22.00    Total pack years: 88.00    Types: Cigarettes   Smokeless tobacco: Never   Tobacco comments:    Currently smoking 1ppd as of 03/30/22 ep  Vaping Use   Vaping Use: Never used  Substance and Sexual Activity   Alcohol use: Yes    Comment: occ- on holidays   Drug use: Never   Sexual activity: Yes    Birth control/protection: Surgical  Other Topics Concern   Not on file  Social History Narrative   Not on file   Social Determinants of Health   Financial Resource Strain: Not on file  Food Insecurity: Food Insecurity Present (02/12/2022)   Hunger Vital Sign    Worried About Running Out of Food in the Last Year: Sometimes true    Ran Out of Food in the Last Year: Sometimes true  Transportation Needs: No Transportation Needs (02/12/2022)   PRAPARE - Hydrologist (Medical): No    Lack of Transportation (Non-Medical): No  Physical Activity: Not on file  Stress: Not on file  Social Connections: Not on file  Intimate Partner Violence: Not on file    Review of Systems  Respiratory:  Positive for shortness of breath.   Psychiatric/Behavioral:  Positive for sleep disturbance.     Vitals:   09/18/22 0826  BP: 132/80  Pulse: 92  SpO2: 98%     Physical Exam Constitutional:      Appearance: She is obese.  HENT:     Nose: Nose normal.     Mouth/Throat:     Mouth: Mucous membranes are moist.  Eyes:     General: No scleral icterus. Cardiovascular:     Rate and Rhythm: Normal rate and regular rhythm.     Heart sounds:  No murmur heard.    No friction rub.  Pulmonary:     Effort: No respiratory distress.  Breath sounds: No stridor. No wheezing or rhonchi.  Musculoskeletal:     Cervical back: No rigidity.  Neurological:     Mental Status: She is alert.  Psychiatric:        Mood and Affect: Mood normal.     Data Reviewed: Positive studies not available  She is scheduled for home sleep study result not available  Past CT scan of the chest with some calcified adenopathy  Home sleep study 08/02/2022 shows no significant sleep apnea but significant oxygen desaturations, sleep quality may not been optimal on the day of the study according to patient  Assessment:  Excessive daytime sleepiness  Nonrestorative sleep  Active smoker  With patient having continuing symptoms, it is important to definitively rule out significant sleep disordered breathing, will benefit from an in lab sleep study to ascertain if these significant sleep apnea or significant oxygen desaturations without significant sleep apnea in which case patient may benefit from oxygen supplementation  Pathophysiology of sleep disordered breathing discussed with patient Treatment options discussed with the patient  Plan/Recommendations:  Schedule in lab sleep study  Encouraged to focus on weight loss efforts  Encouraged to work on quitting smoking  Graded exercise as tolerated  Follow-up in 3 to 4 months   Sherrilyn Rist MD [facility] 09/18/2022, 8:34 AM  CC: Leamon Arnt, MD

## 2022-10-08 IMAGING — CT CT HEART MORP W/ CTA COR W/ SCORE W/ CA W/CM &/OR W/O CM
4 of 7 series · 8 of 20 positions shown, 9 images · IV contrast (APPLIED)
Comparison: Chest radiograph 05/07/2020

Addendum:
CLINICAL DATA: Chest pain

EXAM:
Cardiac/Coronary CTA
TECHNIQUE: A non-contrast, gated CT scan was obtained with axial slices of 3 mm
through the heart for calcium scoring. Calcium scoring was performed
using the Agatston method. A 130 kV prospective, gated, contrast
cardiac scan was obtained. Gantry rotation speed was 250 msecs and
collimation was 0.6 mm. 10 mg IV diltiazem and 5 mg IV metoprolol
was given. Two sublingual nitroglycerin tablets (0.8 mg) were given.
The 3D data set was reconstructed in 5% intervals of the 35-75% of
the R-R cycle. Diastolic phases were analyzed on a dedicated
workstation using MPR, MIP, and VRT modes. The patient received 95
cc of contrast.

[Series 8: ts diast sharp · axial · 0.39mm/px · z∈[-242,-209]mm · 2 of 248 slices shown]
[im 83/248  lung]
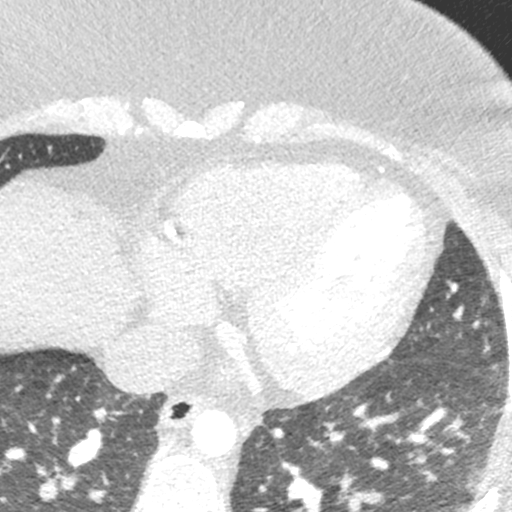
[im 165/248  lung]
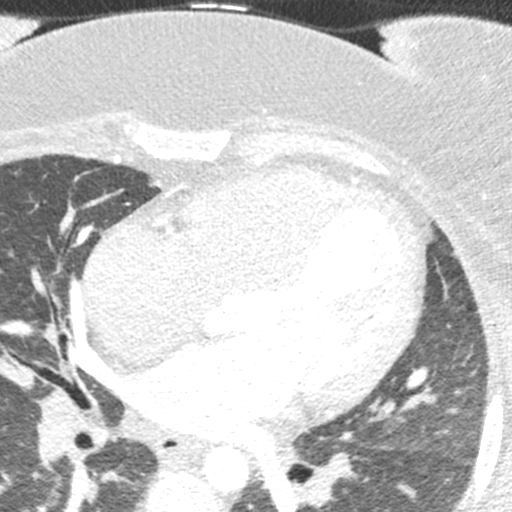

[Series 9: ts syst sharp · axial · 0.39mm/px · z∈[-242,-209]mm · 2 of 248 slices shown]
[im 83/248  lung]
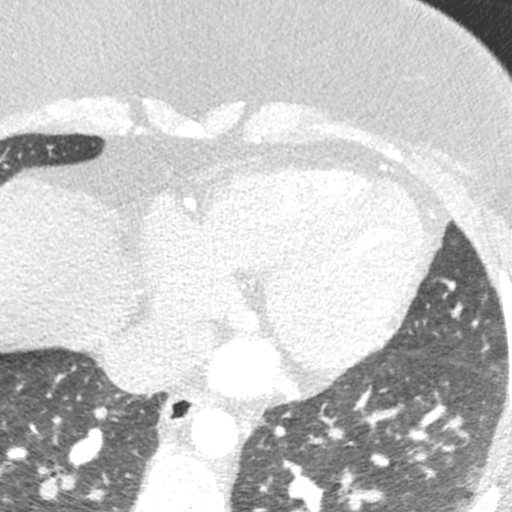
[im 165/248  lung]
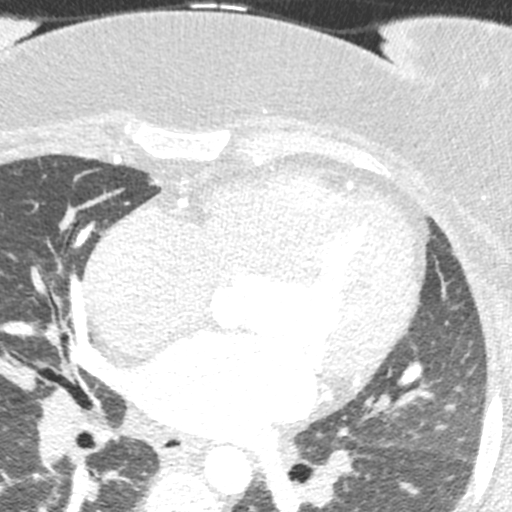

[Series 10: best syst · axial · 0.39mm/px · z∈[-242,-209]mm · 2 of 248 slices shown, 3 images]
[im 83/248  vessel]
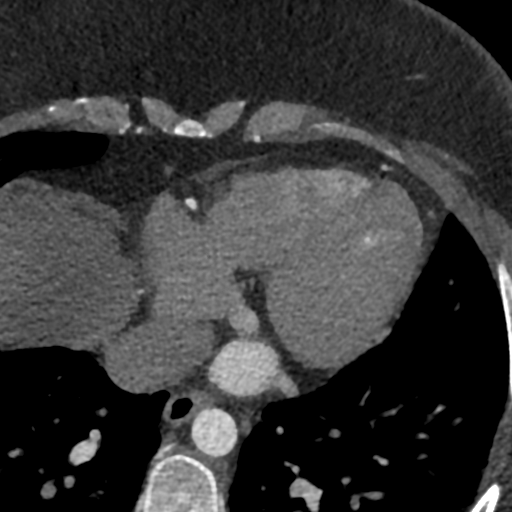
[im 83/248  lung]
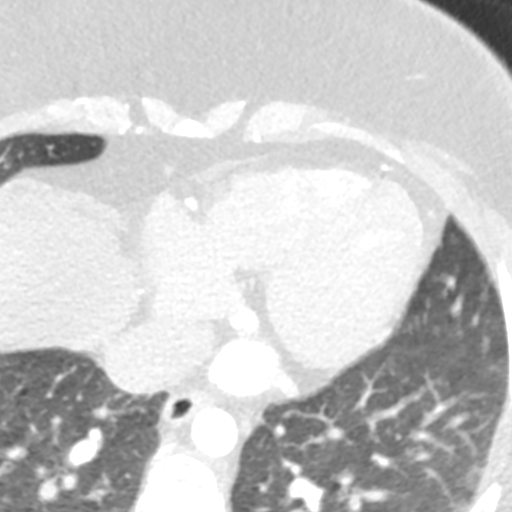
[im 165/248  vessel]
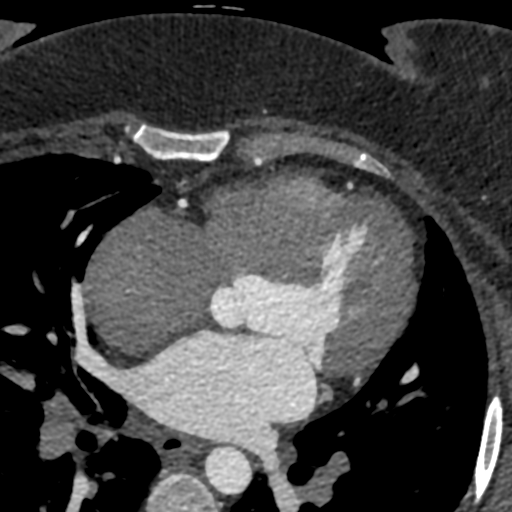

[Series 11: best diast · axial · 0.39mm/px · z∈[-242,-209]mm · 2 of 248 slices shown]
[im 83/248  vessel]
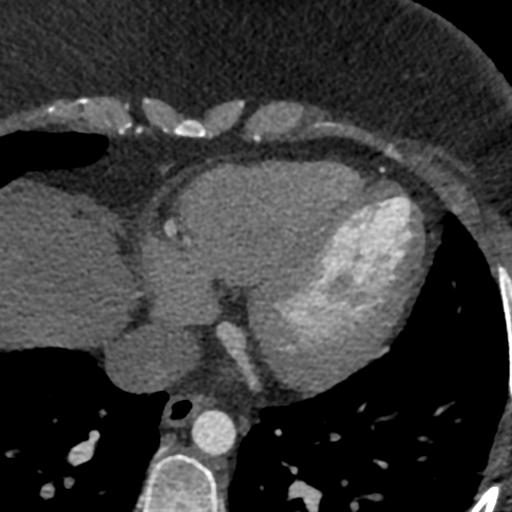
[im 165/248  vessel]
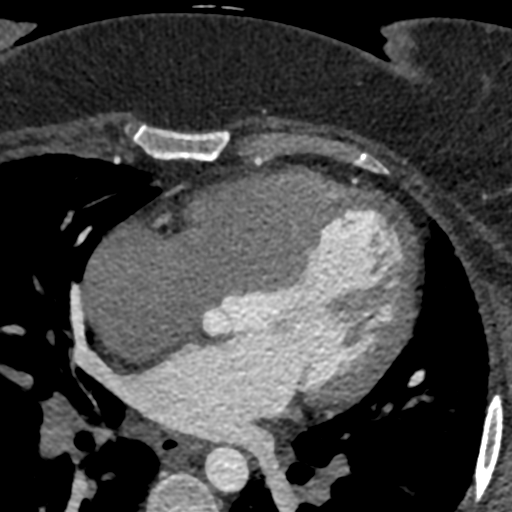

[8 of 20 positions shown; findings below may reference images not displayed]

FINDINGS: Image quality: Excellent.

Noise artifact is: Limited.

Coronary Arteries:  Normal coronary origin.  Right dominance.

Left main: The left main is a large caliber vessel with a normal
take off from the left coronary cusp that bifurcates to form a left
anterior descending artery and a left circumflex artery. There is no
plaque or stenosis.

Left anterior descending artery: The LAD is patent without evidence
of plaque or stenosis. The LAD gives off 2 patent diagonal branches.

Left circumflex artery: The LCX is non-dominant and patent with no
evidence of plaque or stenosis. The LCX gives off 1 patent obtuse
marginal branch.

Right coronary artery: The RCA is dominant with normal take off from
the right coronary cusp. There is no evidence of plaque or stenosis.
The RCA terminates as a PDA and right posterolateral branch without
evidence of plaque or stenosis.

Right Atrium: Right atrial size is within normal limits.

Right Ventricle: The right ventricular cavity is within normal
limits.

Left Atrium: Left atrial size is normal in size with no left atrial
appendage filling defect.

Left Ventricle: The ventricular cavity size is within normal limits.

Pulmonary arteries: Normal in size without proximal filling defect.

Pulmonary veins: Normal pulmonary venous drainage.

Pericardium: Normal thickness without significant effusion or
calcium present.

Aorta: Normal caliber without significant disease.

Extra-cardiac findings: See attached radiology report for
non-cardiac structures.
IMPRESSION: 1. Coronary calcium score of 0.

2. Normal coronary origin with right dominance.

3. Normal coronary arteries.

RECOMMENDATIONS:
1. No evidence of CAD (0%). Consider non-atherosclerotic causes of
chest pain.

EXAM:
OVER-READ INTERPRETATION  CT CHEST

The following report is an over-read performed by radiologist Dr.
over-read does not include interpretation of cardiac or coronary
anatomy or pathology. The coronary CTA interpretation by the
cardiologist is attached.
FINDINGS: Calcified subcarinal lymph nodes compatible with old granulomatous
disease. Densely calcified 9 by 7 mm left lower lobe nodule
compatible with benign granuloma.

Mild bilateral airway thickening is observed. Mild lower thoracic
spondylosis. Suspected mild atelectasis in the right middle lobe
medially with localized subpleural density in this vicinity
measuring up to 4 mm in thickness on image 26 series 13.
IMPRESSION: 1. Old granulomatous disease.
2. Mild atelectasis medially in the right middle lobe.
3. Airway thickening is present, suggesting bronchitis or reactive
airways disease.

*** End of Addendum ***
FINDINGS: Image quality: Excellent.

Noise artifact is: Limited.

Coronary Arteries:  Normal coronary origin.  Right dominance.

Left main: The left main is a large caliber vessel with a normal
take off from the left coronary cusp that bifurcates to form a left
anterior descending artery and a left circumflex artery. There is no
plaque or stenosis.

Left anterior descending artery: The LAD is patent without evidence
of plaque or stenosis. The LAD gives off 2 patent diagonal branches.

Left circumflex artery: The LCX is non-dominant and patent with no
evidence of plaque or stenosis. The LCX gives off 1 patent obtuse
marginal branch.

Right coronary artery: The RCA is dominant with normal take off from
the right coronary cusp. There is no evidence of plaque or stenosis.
The RCA terminates as a PDA and right posterolateral branch without
evidence of plaque or stenosis.

Right Atrium: Right atrial size is within normal limits.

Right Ventricle: The right ventricular cavity is within normal
limits.

Left Atrium: Left atrial size is normal in size with no left atrial
appendage filling defect.

Left Ventricle: The ventricular cavity size is within normal limits.

Pulmonary arteries: Normal in size without proximal filling defect.

Pulmonary veins: Normal pulmonary venous drainage.

Pericardium: Normal thickness without significant effusion or
calcium present.

Aorta: Normal caliber without significant disease.

Extra-cardiac findings: See attached radiology report for
non-cardiac structures.
IMPRESSION: 1. Coronary calcium score of 0.

2. Normal coronary origin with right dominance.

3. Normal coronary arteries.

RECOMMENDATIONS:
1. No evidence of CAD (0%). Consider non-atherosclerotic causes of
chest pain.

## 2022-10-18 ENCOUNTER — Encounter: Payer: Medicaid Other | Admitting: Family Medicine

## 2022-11-08 ENCOUNTER — Encounter: Payer: Self-pay | Admitting: Family Medicine

## 2022-11-08 ENCOUNTER — Encounter: Payer: Self-pay | Admitting: Pulmonary Disease

## 2022-11-08 ENCOUNTER — Ambulatory Visit (INDEPENDENT_AMBULATORY_CARE_PROVIDER_SITE_OTHER): Payer: Medicaid Other | Admitting: Family Medicine

## 2022-11-08 VITALS — BP 128/78 | HR 86 | Temp 98.6°F | Ht 63.5 in | Wt 321.2 lb

## 2022-11-08 DIAGNOSIS — R3589 Other polyuria: Secondary | ICD-10-CM | POA: Diagnosis not present

## 2022-11-08 DIAGNOSIS — F3342 Major depressive disorder, recurrent, in full remission: Secondary | ICD-10-CM | POA: Diagnosis not present

## 2022-11-08 DIAGNOSIS — Z0000 Encounter for general adult medical examination without abnormal findings: Secondary | ICD-10-CM | POA: Diagnosis not present

## 2022-11-08 DIAGNOSIS — R911 Solitary pulmonary nodule: Secondary | ICD-10-CM | POA: Insufficient documentation

## 2022-11-08 DIAGNOSIS — R413 Other amnesia: Secondary | ICD-10-CM

## 2022-11-08 DIAGNOSIS — D72829 Elevated white blood cell count, unspecified: Secondary | ICD-10-CM | POA: Insufficient documentation

## 2022-11-08 DIAGNOSIS — Z9189 Other specified personal risk factors, not elsewhere classified: Secondary | ICD-10-CM

## 2022-11-08 DIAGNOSIS — G4733 Obstructive sleep apnea (adult) (pediatric): Secondary | ICD-10-CM

## 2022-11-08 DIAGNOSIS — F17210 Nicotine dependence, cigarettes, uncomplicated: Secondary | ICD-10-CM

## 2022-11-08 DIAGNOSIS — R072 Precordial pain: Secondary | ICD-10-CM | POA: Insufficient documentation

## 2022-11-08 LAB — CBC WITH DIFFERENTIAL/PLATELET
Basophils Absolute: 0.2 10*3/uL — ABNORMAL HIGH (ref 0.0–0.1)
Basophils Relative: 1 % (ref 0.0–3.0)
Eosinophils Absolute: 0.1 10*3/uL (ref 0.0–0.7)
Eosinophils Relative: 0.8 % (ref 0.0–5.0)
HCT: 40.6 % (ref 36.0–46.0)
Hemoglobin: 13.7 g/dL (ref 12.0–15.0)
Lymphocytes Relative: 23.3 % (ref 12.0–46.0)
Lymphs Abs: 3.6 10*3/uL (ref 0.7–4.0)
MCHC: 33.9 g/dL (ref 30.0–36.0)
MCV: 88.7 fl (ref 78.0–100.0)
Monocytes Absolute: 0.8 10*3/uL (ref 0.1–1.0)
Monocytes Relative: 5.3 % (ref 3.0–12.0)
Neutro Abs: 10.6 10*3/uL — ABNORMAL HIGH (ref 1.4–7.7)
Neutrophils Relative %: 69.6 % (ref 43.0–77.0)
Platelets: 406 10*3/uL — ABNORMAL HIGH (ref 150.0–400.0)
RBC: 4.58 Mil/uL (ref 3.87–5.11)
RDW: 14.1 % (ref 11.5–15.5)
WBC: 15.3 10*3/uL — ABNORMAL HIGH (ref 4.0–10.5)

## 2022-11-08 LAB — LIPID PANEL
Cholesterol: 167 mg/dL (ref 0–200)
HDL: 48.1 mg/dL (ref >39.00)
LDL Cholesterol: 97 mg/dL (ref 0–99)
NonHDL: 118.81
Total CHOL/HDL Ratio: 3
Triglycerides: 109 mg/dL (ref 0.0–149.0)
VLDL: 21.8 mg/dL (ref 0.0–40.0)

## 2022-11-08 LAB — COMPREHENSIVE METABOLIC PANEL
ALT: 13 U/L (ref 0–35)
AST: 16 U/L (ref 0–37)
Albumin: 4 g/dL (ref 3.5–5.2)
Alkaline Phosphatase: 60 U/L (ref 39–117)
BUN: 8 mg/dL (ref 6–23)
CO2: 25 mEq/L (ref 19–32)
Calcium: 9.4 mg/dL (ref 8.4–10.5)
Chloride: 104 mEq/L (ref 96–112)
Creatinine, Ser: 0.83 mg/dL (ref 0.40–1.20)
GFR: 91.79 mL/min (ref >60.00)
Glucose, Bld: 89 mg/dL (ref 70–99)
Potassium: 4.6 mEq/L (ref 3.5–5.1)
Sodium: 137 mEq/L (ref 135–145)
Total Bilirubin: 0.5 mg/dL (ref 0.2–1.2)
Total Protein: 7.2 g/dL (ref 6.0–8.3)

## 2022-11-08 LAB — HEMOGLOBIN A1C: Hgb A1c MFr Bld: 5.9 % (ref 4.6–6.5)

## 2022-11-08 LAB — TSH: TSH: 2.8 u[IU]/mL (ref 0.35–5.50)

## 2022-11-08 NOTE — Patient Instructions (Addendum)
Please return in 12 months for your annual complete physical; please come fasting.   I have referred you for neuorpsychological testing to help determine why you are having problems with your memory.  Will call you with an appointment.  I will release your lab results to you on your MyChart account with further instructions. You may see the results before I do, but when I review them I will send you a message with my report or have my assistant call you if things need to be discussed. Please reply to my message with any questions. Thank you!   If you have any questions or concerns, please don't hesitate to send me a message via MyChart or call the office at [phone]. Thank you for visiting with [place] today! It's our pleasure caring for you.   I have ordered a mammogram and/or bone density for you as we discussed today:   Mammogram    Bone Density  Please call the office checked below to schedule your appointment:    [facility]      [address] [place], [place]        [phone]           [facility]  [address] [place], [place]  [phone] Health maintenance:

## 2022-11-08 NOTE — Progress Notes (Signed)
Subjective  Chief Complaint  Patient presents with   Annual Exam    Pt here for Annual Exam and is not currently fasting    Obesity    HPI: Deborah Andrews is a 37 y.o. female who presents to [facility] [place] today for a Female Wellness Visit.  She also has the concerns and/or needs as listed above in the chief complaint. These will be addressed in addition to the Health Maintenance Visit.   Wellness Visit: annual visit with health maintenance review and exam without Pap  Health maintenance: Patient with elevated risk for breast cancer, reviewed genetic evaluation.  Overdue for mammogram, consider every 6 month evaluation with alternating breast MRI and mammograms.  No longer needs Pap smears given partial hysterectomy status.  Declines immunizations. Chronic disease management visit and/or acute problem visit: Morbid obesity: Reviewed diet.  Finally working on positive changes.  Was drinking between 5 and 8 20 ounce Coca-Cola's per day.  Stop today this week.  Also drinks multiple cups of coffee and is the likely cause of her palpitations.  Has not yet started phentermine but would like to in the future.  Unable to exercise due to difficulty with ambulation. Dependent edema: Rarely uses Lasix because she feels she has polyuria.  No polydipsia Depression history: Feels her mood is good however her mother passed in January and she is appropriately grieving. Precordial chest pain, atypical: Normal coronary calcium score and echocardiogram.  Seeing cardiology.  Started Imdur and patient feels that symptoms have improved.  I reviewed notes and study results Obstructive sleep apnea: Reviewed pulmonology evaluation.  He had in-home sleep test which showed possible mild obstructive sleep apnea.  To further clarify, needs in lab sleep study scheduled.  Did not tolerate CPAP in the past. Very concerned about her memory: Feels that her memory is doing worse.  Has trouble  remembering dates, times.  Daughter helps her a lot. 6CIT score today: 13 of 28, did not know month.  And could not recall any of the 5 component phrase. Smoking: Says she gave up her last cigarette today.  On Chantix which is helpful.  Will use vape if needed for nicotine withdrawal symptoms.  Assessment  1. Annual physical exam   2. Cigarette nicotine dependence without complication   3. Morbid obesity due to excess calories   4. Depression, major, recurrent, in complete remission   5. At high risk for breast cancer   6. OSA (obstructive sleep apnea)   7. Precordial pain   8. Memory difficulty   9. Polyuria      Plan  Female Wellness Visit: Age appropriate Health Maintenance and Prevention measures were discussed with patient. Included topics are cancer screening recommendations, ways to keep healthy (see AVS) including dietary and exercise recommendations, regular eye and dental care, use of seat belts, and avoidance of moderate alcohol use and tobacco use.  Mammogram ordered.  High risk.  Referred to genetics evaluation summation. BMI: discussed patient's BMI and encouraged positive lifestyle modifications to help get to or maintain a target BMI. HM needs and immunizations were addressed and ordered. See below for orders. See HM and immunization section for updates. Routine labs and screening tests ordered including cmp, cbc and lipids where appropriate. Discussed recommendations regarding Vit D and calcium supplementation (see AVS)  Chronic disease f/u and/or acute problem visit: (deemed necessary to be done in addition to the wellness visit): Morbid obesity: Praised for stopping Coca-Cola's.  Decrease caffeine intake.  Once off all stimulants can consider phentermine.  She has tolerated this in the past. Refer to neuropsychology for further cognitive evaluation.  Suspect multifactorial.  Has history of childhood trauma, depression and multiple concussions. Precordial pain per  cardiology.  Continue Imdur.  Improved.  Stop caffeine.  No signs of ischemic heart disease currently Continue smoking cessation efforts with Chantix patient denies mood problems with that Screen for diabetes given polyuria Extremity edema due to morbid obesity.  No pitting edema today.  Reassured.  Follow up: 12 months for complete physical  Orders Placed This Encounter  Procedures   MM DIGITAL SCREENING BILATERAL   CBC with Differential/Platelet   Comprehensive metabolic panel   Lipid panel   TSH   Hemoglobin A1c   Ambulatory referral to Neuropsychology   No orders of the defined types were placed in this encounter.      Body mass index is 56.01 kg/m. Wt Readings from Last 3 Encounters:  11/08/22 (!) 321 lb 3.2 oz (145.7 kg)  09/18/22 (!) 320 lb (145.2 kg)  08/20/22 (!) 313 lb 6.4 oz (142.2 kg)     Patient Active Problem List   Diagnosis Date Noted   Precordial pain 11/08/2022    Priority: High    Cardiology evaluation: Normal coronary calcium score of 0 with normal coronaries.  Normal echocardiogram.  Trial of Imdur for vasospasm.    OSA (obstructive sleep apnea) 03/30/2022    Priority: High   At high risk for breast cancer 08/04/2020    Priority: High    Lifetime risk by Tyrick -cruscick 20.8%: consider annual breast MRI in conjunction with mammo Ordered mammo 10/2022    Cigarette nicotine dependence without complication 06/07/2020    Priority: High   Morbid obesity due to excess calories 06/07/2020    Priority: High   Depression, major, recurrent, in complete remission 06/07/2020    Priority: High    Long history; has been on multiple SSRIs in the past. Denies bipolar disorder    Dependent edema 04/20/2022    Priority: Medium    Syncope 2022 08/03/2021    Priority: Medium     Nl echocardiogram, labs and 14 day holter (rare pvc's with one 10 beat run of SVT): offered BB prn    Mild intermittent asthma without complication 06/07/2020    Priority: Medium      PFT 05/2022: normal except mild decrease in diffusion capacity. Dr. Frances Furbish.     Left lower lobe pulmonary nodule 11/08/2022    Priority: Low    Chest CT; calcified; likely granuloma. 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 Valtrex prn outbreaks    Genetic testing 07/27/2020    Priority: Low    Negative hereditary cancer genetic testing: no pathogenic variants detected in [facility] Multi-Cancer +RNA Panel.  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  The report date is July 26, 2020.   The Multi-Cancer + RNA Panel offered by [facility] includes sequencing and/or deletion/duplication analysis of the following 84 genes:  AIP*, ALK, APC*, ATM*, AXIN2*, BAP1*, BARD1*, BLM*, BMPR1A*, BRCA1*, BRCA2*, BRIP1*, CASR, CDC73*, CDH1*, CDK4, CDKN1B*, CDKN1C*, CDKN2A, CEBPA, CHEK2*, CTNNA1*, DICER1*, DIS3L2*, EGFR, EPCAM, FH*, FLCN*, GATA2*, GPC3, GREM1, HOXB13, HRAS, KIT, MAX*, MEN1*, MET, MITF, MLH1*, MSH2*, MSH3*, MSH6*, MUTYH*, NBN*, NF1*, NF2*, NTHL1*, PALB2*, PDGFRA, PHOX2B, PMS2*, POLD1*, POLE*, POT1*, PRKAR1A*, PTCH1*, PTEN*, RAD50*, RAD51C*,  RAD51D*, RB1*, RECQL4, RET, RUNX1*, SDHA*, SDHAF2*, SDHB*, SDHC*, SDHD*, SMAD4*, SMARCA4*, SMARCB1*, SMARCE1*, STK11*, SUFU*, TERC, TERT, TMEM127*, Tp53*, TSC1*, TSC2*, VHL*, WRN*, and WT1.  RNA analysis is performed for * genes.     Health Maintenance  Topic Date Due   DTaP/Tdap/Td (2 - Td or Tdap) 02/20/2026   Hepatitis C Screening  Completed   HIV Screening  Completed   HPV VACCINES  Aged Out   INFLUENZA VACCINE  Discontinued   PAP SMEAR-Modifier  Discontinued   COVID-19 Vaccine  Discontinued   Immunization History  Administered Date(s) Administered   Tdap 02/21/2016   We updated and reviewed the patient's past history in detail and it is documented below. Allergies: Patient  reports current alcohol  use. 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 Past Surgical History Patient  has a past surgical history that includes Cesarean section; Cholecystectomy; Tonsillectomy; Wisdom tooth extraction; Tubal ligation; Cervical cone biopsy; Endometrial biopsy; and Vaginal hysterectomy (Bilateral, 01/02/2022). Social History   Socioeconomic History   Marital status: Significant Other    Spouse name: boyfriend   Number of children: Not on file   Years of education: Not on file   Highest education level: Not on file  Occupational History   Not on file  Tobacco Use   Smoking status: Every Day    Packs/day: 4.00    Years: 22.00    Additional pack years: 0.00    Total pack years: 88.00    Types: Cigarettes   Smokeless tobacco: Never   Tobacco comments:    Currently smoking 1ppd as of 03/30/22 ep  Vaping Use   Vaping Use: Never used  Substance and Sexual Activity   Alcohol use: Yes    Comment: occ- on holidays   Drug use: Never   Sexual activity: Yes    Birth control/protection: Surgical  Other Topics Concern   Not on file  Social History Narrative   Not on file   Social Determinants of Health   Financial Resource Strain: Not on file  Food Insecurity: Food Insecurity Present (02/12/2022)   Hunger Vital Sign    Worried About Running Out of Food in the Last Year: Sometimes true    Ran Out of Food in the Last Year: Sometimes true  Transportation Needs: No Transportation Needs (02/12/2022)   PRAPARE - Administrator, Civil Service (Medical): No    Lack of Transportation (Non-Medical): No  Physical Activity: Not on  file  Stress: Not on file  Social Connections: Not on file   Family History  Problem Relation Age of Onset   Diabetes Mother    Hypertension Mother    Breast cancer Mother        dx before age 49   Cancer Mother 77       Gyn. ?cervical ?ovarian    Cancer - Other Mother 33       unknown primary. ?kidney   Diabetes Father    Breast cancer Maternal Uncle 72       female breast cancer   Cervical cancer Half-Sister 59       maternal half sister   Leukemia Half-Brother 2       maternal half brother   Skin cancer  Maternal Aunt        x3   Melanoma Maternal Grandmother    Melanoma Maternal Grandfather    Cancer Maternal Grandfather        unknown type    Review of Systems: Constitutional: negative for fever or malaise Ophthalmic: negative for photophobia, double vision or loss of vision Cardiovascular: negative for chest pain, dyspnea on exertion, or new LE swelling Respiratory: negative for SOB or persistent cough Gastrointestinal: negative for abdominal pain, change in bowel habits or melena Genitourinary: negative for dysuria or gross hematuria, no abnormal uterine bleeding or disharge Musculoskeletal: negative for new gait disturbance or muscular weakness Integumentary: negative for new or persistent rashes, no breast lumps Neurological: negative for TIA or stroke symptoms Psychiatric: negative for SI or delusions Allergic/Immunologic: negative for hives  Patient Care Team    Relationship Specialty Notifications Start End  Willow Ora, MD PCP - General Family Medicine  02/21/20   Orbie Pyo, MD PCP - Cardiology Cardiology  07/30/22   Warden Fillers, MD Consulting Physician Obstetrics and Gynecology  07/12/21   Orbie Pyo, MD Consulting Physician Cardiology  10/16/21   Tomma Lightning, MD Consulting Physician Pulmonary Disease  08/20/22     Objective  Vitals: BP 128/78   Pulse 86   Temp 98.6 F (37 C)   Ht 5' 3.5" (1.613 m)   Wt (!) 321 lb 3.2 oz  (145.7 kg)   LMP 12/14/2021   SpO2 96%   BMI 56.01 kg/m  General:  Well developed, well nourished, no acute distress  Psych:  Alert and orientedx3,normal mood and affect HEENT:  Normocephalic, atraumatic, non-icteric sclera, PERRL, supple neck without adenopathy, mass or thyromegaly Cardiovascular:  Normal S1, S2, RRR without gallop, rub or murmur Respiratory:  Good breath sounds bilaterally, CTAB with normal respiratory effort Gastrointestinal: normal bowel sounds, soft, non-tender, no noted masses. No HSM MSK: no deformities, contusions. Joints are without erythema or swelling.  Skin:  Warm, no rashes or suspicious lesions noted Neurologic:    Mental status is normal. Gross motor and sensory exams are normal. Normal gait. No tremor    Commons side effects, risks, benefits, and alternatives for medications and treatment plan prescribed today were discussed, and the patient expressed understanding of the given instructions. Patient is instructed to call or message via MyChart if he/she has any questions or concerns regarding our treatment plan. No barriers to understanding were identified. We discussed Red Flag symptoms and signs in detail. Patient expressed understanding regarding what to do in case of urgent or emergency type symptoms.  Medication list was reconciled, printed and provided to the patient in AVS. Patient instructions and summary information was reviewed with the patient as documented in the AVS. This note was prepared with assistance of Dragon voice recognition software. Occasional wrong-word or sound-a-like substitutions may have occurred due to the inherent limitations of voice recognition software .

## 2022-11-13 ENCOUNTER — Encounter: Payer: Self-pay | Admitting: Family Medicine

## 2022-11-13 ENCOUNTER — Ambulatory Visit (INDEPENDENT_AMBULATORY_CARE_PROVIDER_SITE_OTHER): Payer: Medicaid Other | Admitting: Family Medicine

## 2022-11-13 VITALS — BP 126/80 | HR 87 | Temp 98.3°F | Ht 63.5 in | Wt 322.2 lb

## 2022-11-13 DIAGNOSIS — M25551 Pain in right hip: Secondary | ICD-10-CM

## 2022-11-13 DIAGNOSIS — G8929 Other chronic pain: Secondary | ICD-10-CM

## 2022-11-13 DIAGNOSIS — F17210 Nicotine dependence, cigarettes, uncomplicated: Secondary | ICD-10-CM

## 2022-11-13 DIAGNOSIS — D72829 Elevated white blood cell count, unspecified: Secondary | ICD-10-CM

## 2022-11-13 LAB — CBC WITH DIFFERENTIAL/PLATELET
Basophils Absolute: 0.1 10*3/uL (ref 0.0–0.1)
Basophils Relative: 0.9 % (ref 0.0–3.0)
Eosinophils Absolute: 0.1 10*3/uL (ref 0.0–0.7)
Eosinophils Relative: 1.2 % (ref 0.0–5.0)
HCT: 41.4 % (ref 36.0–46.0)
Hemoglobin: 13.8 g/dL (ref 12.0–15.0)
Lymphocytes Relative: 25.8 % (ref 12.0–46.0)
Lymphs Abs: 2.9 10*3/uL (ref 0.7–4.0)
MCHC: 33.4 g/dL (ref 30.0–36.0)
MCV: 89.2 fl (ref 78.0–100.0)
Monocytes Absolute: 0.5 10*3/uL (ref 0.1–1.0)
Monocytes Relative: 4.8 % (ref 3.0–12.0)
Neutro Abs: 7.6 10*3/uL (ref 1.4–7.7)
Neutrophils Relative %: 67.3 % (ref 43.0–77.0)
Platelets: 381 10*3/uL (ref 150.0–400.0)
RBC: 4.64 Mil/uL (ref 3.87–5.11)
RDW: 14.1 % (ref 11.5–15.5)
WBC: 11.3 10*3/uL — ABNORMAL HIGH (ref 4.0–10.5)

## 2022-11-13 NOTE — Patient Instructions (Signed)
Please follow up if symptoms do not improve or as needed.    We will call you to get you an appointment with sports medicine.  I will let you know what your lab results show.  The most likely cause of the elevated white blood cell count is from smoking.

## 2022-11-13 NOTE — Progress Notes (Signed)
Subjective  CC:  Chief Complaint  Patient presents with   Referral    Pt would like a referral to Orthro to see about her hip pain     HPI: Deborah Andrews is a 37 y.o. female who presents to the office today to address the problems listed above in the chief complaint. 37 year old female here to talk about her chronic hip pain.  She reports in 2011 she had hip pain and an orthopedic doctor in [place] diagnosed her with a fracture in the neck of her femur but there was nothing to be done for it.  Since, she reports hip pain, hip joint going in and out and give way symptoms.  She says she has not had any further therapy.  It continues to be a problem.  I do not have any old records.  She would like to see an orthopedic surgeon Reviewed recent blood work from her physical, white blood cell count was elevated with elevated neutrophils.  She denies any symptoms of infection including no fevers chills sweats cough URI dysuria abdominal pain, vaginal discharge.  She has had elevated white blood cell counts last year as well but this was in the setting of a vaginal cuff infection after hysterectomy.  She is a smoker.  Assessment  1. Chronic hip pain, right   2. Leukocytosis, unspecified type   3. Cigarette nicotine dependence without complication   4. Morbid obesity due to excess calories      Plan  Chronic hip pain: Unclear past etiology.  Needs new workup.  Clinical exam today most consistent with a bursitis.  Education given.  Referral to sports medicine. Leukocytosis: Will repeat testing today.  Possibly related to smoking.  If very elevated or persist, may need hematology evaluation.  Check sed rate and CRP as well.  She has had normal thyroid, liver and kidney test recently.  Follow up: As needed Visit date not found  Orders Placed This Encounter  Procedures   CBC with Differential/Platelet   Pathologist smear review   Sedimentation rate   C-reactive protein   Ambulatory  referral to Sports Medicine   No orders of the defined types were placed in this encounter.     I reviewed the patients updated PMH, FH, and SocHx.    Patient Active Problem List   Diagnosis Date Noted   Precordial pain 11/08/2022    Priority: High   OSA (obstructive sleep apnea) 03/30/2022    Priority: High   At high risk for breast cancer 08/04/2020    Priority: High   Cigarette nicotine dependence without complication 06/07/2020    Priority: High   Morbid obesity due to excess calories 06/07/2020    Priority: High   Depression, major, recurrent, in complete remission 06/07/2020    Priority: High   Leukocytosis 11/08/2022    Priority: Medium    Dependent edema 04/20/2022    Priority: Medium    Syncope 2022 08/03/2021    Priority: Medium    Mild intermittent asthma without complication 06/07/2020    Priority: Medium    Left lower lobe pulmonary nodule 11/08/2022    Priority: Low   S/P vaginal hysterectomy 01/02/2022    Priority: Low   Herpes simplex 12/05/2020    Priority: Low   Genetic testing 07/27/2020    Priority: Low   Current Meds  Medication Sig   furosemide (LASIX) 20 MG tablet Take 1 tablet (20 mg total) by mouth daily as needed for edema. Take  with potassium   isosorbide mononitrate (IMDUR) 30 MG 24 hr tablet Take 1 tablet (30 mg total) by mouth at bedtime.   loratadine (CLARITIN) 10 MG tablet Take 10 mg by mouth daily. Pt takes over the counter Aller-itin for allergies.   Moringa Oleifera (MORINGA PO) Take 1,000 mg by mouth daily. Moringa an over the counter multi vitamin supplement.   Multiple Vitamin (MULTIVITAMIN) capsule Take 1 capsule by mouth daily.   omeprazole (PRILOSEC) 20 MG capsule TAKE 1 CAPSULE(20 MG) BY MOUTH DAILY   phentermine (ADIPEX-P) 37.5 MG tablet Take 1 tablet (37.5 mg total) by mouth daily before breakfast.   potassium chloride SA (KLOR-CON M) 20 MEQ tablet Take 1 tablet (20 mEq total) by mouth daily.   varenicline (CHANTIX) 1 MG  tablet TAKE 1 TABLET BY MOUTH TWICE DAILY   VENTOLIN HFA 108 (90 Base) MCG/ACT inhaler INHALE 2 PUFFS INTO THE LUNGS EVERY 4 HOURS AS NEEDED FOR WHEEZING OR SHORTNESS OF BREATH    Allergies: Patient has No Known Allergies. Family History: Patient family history includes Breast cancer in her mother; Breast cancer (age of onset: 23) in her maternal uncle; Cancer in her maternal grandfather; Cancer (age of onset: 7) in her mother; Cancer - Other (age of onset: 58) in her mother; Cervical cancer (age of onset: 39) in her half-sister; Diabetes in her father and mother; Hypertension in her mother; Leukemia (age of onset: 2) in her half-brother; Melanoma in her maternal grandfather and maternal grandmother; Skin cancer in her maternal aunt. Social History:  Patient  reports that she has been smoking cigarettes. She has a 88.00 pack-year smoking history. She has never used smokeless tobacco. She reports current alcohol use. She reports that she does not use drugs.  Review of Systems: Constitutional: Negative for fever malaise or anorexia Cardiovascular: negative for chest pain Respiratory: negative for SOB or persistent cough Gastrointestinal: negative for abdominal pain  Objective  Vitals: BP 126/80   Pulse 87   Temp 98.3 F (36.8 C)   Ht 5' 3.5" (1.613 m)   Wt (!) 322 lb 3.2 oz (146.1 kg)   LMP 12/14/2021   SpO2 97%   BMI 56.18 kg/m  General: no acute distress , A&Ox3 Skin:  Warm, no rashes Musculoskeletal: Limps to the table, right lateral tenderness over the greater trochanteric bursa, range of motion at right hip fairly good flexion with mildly decreased external rotation due to pain.  No groin tenderness  Commons side effects, risks, benefits, and alternatives for medications and treatment plan prescribed today were discussed, and the patient expressed understanding of the given instructions. Patient is instructed to call or message via MyChart if he/she has any questions or concerns  regarding our treatment plan. No barriers to understanding were identified. We discussed Red Flag symptoms and signs in detail. Patient expressed understanding regarding what to do in case of urgent or emergency type symptoms.  Medication list was reconciled, printed and provided to the patient in AVS. Patient instructions and summary information was reviewed with the patient as documented in the AVS. This note was prepared with assistance of Dragon voice recognition software. Occasional wrong-word or sound-a-like substitutions may have occurred due to the inherent limitations of voice recognition software

## 2022-11-14 ENCOUNTER — Encounter: Payer: Self-pay | Admitting: Family Medicine

## 2022-11-14 LAB — C-REACTIVE PROTEIN: CRP: 34.9 mg/L — ABNORMAL HIGH (ref <8.0)

## 2022-11-14 LAB — SEDIMENTATION RATE: Sed Rate: 56 mm/h — ABNORMAL HIGH (ref 0–20)

## 2022-11-14 LAB — PATHOLOGIST SMEAR REVIEW

## 2022-11-19 ENCOUNTER — Encounter: Payer: Self-pay | Admitting: Family Medicine

## 2022-11-19 ENCOUNTER — Ambulatory Visit (INDEPENDENT_AMBULATORY_CARE_PROVIDER_SITE_OTHER): Payer: Medicaid Other | Admitting: Family Medicine

## 2022-11-19 VITALS — BP 124/70 | HR 91 | Temp 98.6°F | Ht 63.5 in | Wt 324.0 lb

## 2022-11-19 DIAGNOSIS — D72829 Elevated white blood cell count, unspecified: Secondary | ICD-10-CM

## 2022-11-19 NOTE — Progress Notes (Signed)
Subjective  CC:  Chief Complaint  Patient presents with   Discuss lab results    HPI: Deborah Andrews is a 37 y.o. female who presents to the office today to address the problems listed above in the chief complaint. Leukocytosis: Patient here to get lab results.  White blood cell count was high and on recheck returned just above normal at 11.6.  Pathology smear review looked mostly good with some absolute neutrophilia and increased platelets.  Inflammatory markers were elevated.  Patient feels well.  She quit smoking 2 weeks ago.  Assessment  1. Leukocytosis, unspecified type      Plan  Leukocytosis: Likely reactive due to smoking.  Will follow.  Encouraged to quit smoking.  Elevated inflammatory markers for unclear cause.  Patient reports history of rheumatoid arthritis but I do not have that documented.  She is to see sports medicine for hip pain.  Hopefully they can help clear this up.  Follow up: As needed Visit date not found  No orders of the defined types were placed in this encounter.  No orders of the defined types were placed in this encounter.     I reviewed the patients updated PMH, FH, and SocHx.    Patient Active Problem List   Diagnosis Date Noted   Precordial pain 11/08/2022    Priority: High   OSA (obstructive sleep apnea) 03/30/2022    Priority: High   At high risk for breast cancer 08/04/2020    Priority: High   Cigarette nicotine dependence without complication 06/07/2020    Priority: High   Morbid obesity due to excess calories (HCC) 06/07/2020    Priority: High   Depression, major, recurrent, in complete remission (HCC) 06/07/2020    Priority: High   Leukocytosis 11/08/2022    Priority: Medium    Dependent edema 04/20/2022    Priority: Medium    Syncope 2022 08/03/2021    Priority: Medium    Mild intermittent asthma without complication 06/07/2020    Priority: Medium    Left lower lobe pulmonary nodule 11/08/2022    Priority: Low   S/P  vaginal hysterectomy 01/02/2022    Priority: Low   Herpes simplex 12/05/2020    Priority: Low   Genetic testing 07/27/2020    Priority: Low   Current Meds  Medication Sig   furosemide (LASIX) 20 MG tablet Take 1 tablet (20 mg total) by mouth daily as needed for edema. Take with potassium   isosorbide mononitrate (IMDUR) 30 MG 24 hr tablet Take 1 tablet (30 mg total) by mouth at bedtime.   loratadine (CLARITIN) 10 MG tablet Take 10 mg by mouth daily. Pt takes over the counter Aller-itin for allergies.   Moringa Oleifera (MORINGA PO) Take 1,000 mg by mouth daily. Moringa an over the counter multi vitamin supplement.   Multiple Vitamin (MULTIVITAMIN) capsule Take 1 capsule by mouth daily.   omeprazole (PRILOSEC) 20 MG capsule TAKE 1 CAPSULE(20 MG) BY MOUTH DAILY   phentermine (ADIPEX-P) 37.5 MG tablet Take 1 tablet (37.5 mg total) by mouth daily before breakfast.   potassium chloride SA (KLOR-CON M) 20 MEQ tablet Take 1 tablet (20 mEq total) by mouth daily.   varenicline (CHANTIX) 1 MG tablet TAKE 1 TABLET BY MOUTH TWICE DAILY   VENTOLIN HFA 108 (90 Base) MCG/ACT inhaler INHALE 2 PUFFS INTO THE LUNGS EVERY 4 HOURS AS NEEDED FOR WHEEZING OR SHORTNESS OF BREATH    Allergies: Patient has No Known Allergies. Family History: Patient family history  includes Breast cancer in her mother; Breast cancer (age of onset: 26) in her maternal uncle; Cancer in her maternal grandfather; Cancer (age of onset: 27) in her mother; Cancer - Other (age of onset: 63) in her mother; Cervical cancer (age of onset: 65) in her half-sister; Diabetes in her father and mother; Hypertension in her mother; Leukemia (age of onset: 2) in her half-brother; Melanoma in her maternal grandfather and maternal grandmother; Skin cancer in her maternal aunt. Social History:  Patient  reports that she has been smoking cigarettes. She has a 88.00 pack-year smoking history. She has never used smokeless tobacco. She reports current alcohol  use. She reports that she does not use drugs.  Review of Systems: Constitutional: Negative for fever malaise or anorexia Cardiovascular: negative for chest pain Respiratory: negative for SOB or persistent cough Gastrointestinal: negative for abdominal pain  Objective  Vitals: BP 124/70   Pulse 91   Temp 98.6 F (37 C)   Ht 5' 3.5" (1.613 m)   Wt (!) 324 lb (147 kg)   LMP 12/14/2021   SpO2 94%   BMI 56.49 kg/m  General: no acute distress , A&Ox3 Wt Readings from Last 3 Encounters:  11/19/22 (!) 324 lb (147 kg)  11/13/22 (!) 322 lb 3.2 oz (146.1 kg)  11/08/22 (!) 321 lb 3.2 oz (145.7 kg)    No visits with results within 1 Day(s) from this visit.  Latest known visit with results is:  Office Visit on 11/13/2022  Component Date Value Ref Range Status   Path Review 11/13/2022    Final   Sed Rate 11/13/2022 56 (H)  0 - 20 mm/h Final   CRP 11/13/2022 34.9 (H)  <8.0 mg/L Final   WBC 11/13/2022 11.3 (H)  4.0 - 10.5 K/uL Final   RBC 11/13/2022 4.64  3.87 - 5.11 Mil/uL Final   Hemoglobin 11/13/2022 13.8  12.0 - 15.0 g/dL Final   HCT 40/98/1191 41.4  36.0 - 46.0 % Final   MCV 11/13/2022 89.2  78.0 - 100.0 fl Final   MCHC 11/13/2022 33.4  30.0 - 36.0 g/dL Final   RDW 47/82/9562 14.1  11.5 - 15.5 % Final   Platelets 11/13/2022 381.0  150.0 - 400.0 K/uL Final   Neutrophils Relative % 11/13/2022 67.3  43.0 - 77.0 % Final   Lymphocytes Relative 11/13/2022 25.8  12.0 - 46.0 % Final   Monocytes Relative 11/13/2022 4.8  3.0 - 12.0 % Final   Eosinophils Relative 11/13/2022 1.2  0.0 - 5.0 % Final   Basophils Relative 11/13/2022 0.9  0.0 - 3.0 % Final   Neutro Abs 11/13/2022 7.6  1.4 - 7.7 K/uL Final   Lymphs Abs 11/13/2022 2.9  0.7 - 4.0 K/uL Final   Monocytes Absolute 11/13/2022 0.5  0.1 - 1.0 K/uL Final   Eosinophils Absolute 11/13/2022 0.1  0.0 - 0.7 K/uL Final   Basophils Absolute 11/13/2022 0.1  0.0 - 0.1 K/uL Final     Commons side effects, risks, benefits, and alternatives for  medications and treatment plan prescribed today were discussed, and the patient expressed understanding of the given instructions. Patient is instructed to call or message via MyChart if he/she has any questions or concerns regarding our treatment plan. No barriers to understanding were identified. We discussed Red Flag symptoms and signs in detail. Patient expressed understanding regarding what to do in case of urgent or emergency type symptoms.  Medication list was reconciled, printed and provided to the patient in AVS. Patient instructions and  summary information was reviewed with the patient as documented in the AVS. This note was prepared with assistance of Dragon voice recognition software. Occasional wrong-word or sound-a-like substitutions may have occurred due to the inherent limitations of voice recognition software

## 2022-11-22 ENCOUNTER — Ambulatory Visit
Admission: RE | Admit: 2022-11-22 | Discharge: 2022-11-22 | Disposition: A | Discharge status: Home / Self Care | Payer: Medicaid Other | Source: Ambulatory Visit | Attending: Family Medicine | Admitting: Family Medicine

## 2022-11-22 ENCOUNTER — Telehealth: Payer: Self-pay | Admitting: Family Medicine

## 2022-11-22 DIAGNOSIS — Z9189 Other specified personal risk factors, not elsewhere classified: Secondary | ICD-10-CM

## 2022-11-22 DIAGNOSIS — Z1231 Encounter for screening mammogram for malignant neoplasm of breast: Secondary | ICD-10-CM | POA: Diagnosis not present

## 2022-11-22 NOTE — Telephone Encounter (Signed)
Pt called and states he would like a second opinion referral to be sent to the [facility] in [facility]. Please advise.

## 2022-11-23 ENCOUNTER — Encounter: Payer: Self-pay | Admitting: *Deleted

## 2022-11-23 ENCOUNTER — Other Ambulatory Visit: Payer: Self-pay | Admitting: Family Medicine

## 2022-11-23 ENCOUNTER — Other Ambulatory Visit: Payer: Self-pay

## 2022-11-23 DIAGNOSIS — D72829 Elevated white blood cell count, unspecified: Secondary | ICD-10-CM

## 2022-11-23 DIAGNOSIS — R928 Other abnormal and inconclusive findings on diagnostic imaging of breast: Secondary | ICD-10-CM

## 2022-11-23 NOTE — Telephone Encounter (Signed)
Referral placed.

## 2022-11-23 NOTE — Progress Notes (Signed)
Reached out to Ron Parker to introduce myself as the office RN Navigator and explain our new patient process. Reviewed the reason for their referral and scheduled their new patient appointment along with labs. Provided address and directions to the office including call back phone number. Reviewed with patient any concerns they may have or any possible barriers to attending their appointment.   Informed patient about my role as a navigator and that I will meet with them prior to their New Patient appointment and more fully discuss what services I can provide. At this time patient has no further questions or needs.  .  Oncology Nurse Navigator Documentation     11/23/2022    1:45 PM  Oncology Nurse Navigator Flowsheets  Abnormal Finding Date 11/21/2022  Diagnosis Status Additional Work Up  Navigator Follow Up Date: 12/03/2022  Navigator Follow Up Reason: Scan Review  Navigator Location [facility]  Referral Date to RadOnc/MedOnc 11/23/2022  Navigator Encounter Type Introductory Phone Call  Patient Visit Type MedOnc  Treatment Phase Abnormal Scans  Barriers/Navigation Needs Coordination of Care;Anxiety;Education;Family Concerns;Personal Conflicts  Education Newly Diagnosed Cancer Education;Understanding Cancer/ Treatment Options;Other  Interventions Coordination of Care;Education;Psycho-Social Support  Acuity Level 2-Minimal Needs (1-2 Barriers Identified)  Coordination of Care Appts  Education Method Verbal  Support Groups/Services Friends and Family  Time Spent with Patient 45

## 2022-11-28 ENCOUNTER — Other Ambulatory Visit: Payer: Self-pay | Admitting: Family Medicine

## 2022-12-01 ENCOUNTER — Ambulatory Visit
Admission: RE | Admit: 2022-12-01 | Discharge: 2022-12-01 | Disposition: A | Discharge status: Home / Self Care | Payer: Medicaid Other | Source: Ambulatory Visit | Attending: Family Medicine | Admitting: Family Medicine

## 2022-12-01 DIAGNOSIS — R928 Other abnormal and inconclusive findings on diagnostic imaging of breast: Secondary | ICD-10-CM

## 2022-12-01 DIAGNOSIS — N63 Unspecified lump in unspecified breast: Secondary | ICD-10-CM | POA: Diagnosis not present

## 2022-12-03 ENCOUNTER — Encounter (HOSPITAL_BASED_OUTPATIENT_CLINIC_OR_DEPARTMENT_OTHER): Payer: Medicaid Other | Admitting: Pulmonary Disease

## 2022-12-03 NOTE — Progress Notes (Unsigned)
    Deborah Andrews D.[facility] [address] [place] [address] Phone: [phone]   Assessment and Plan:     There are no diagnoses linked to this encounter.  ***   Pertinent previous records reviewed include ***   Follow Up: ***     Subjective:   I, Deborah Andrews, am serving as a Neurosurgeon for Doctor Richardean Sale  Chief Complaint: hip pain   HPI:   12/04/2022 Patient is a 36 year old female complaining of hip pain. Patient states  Relevant Historical Information: ***  Additional pertinent review of systems negative.   Current Outpatient Medications:    furosemide (LASIX) 20 MG tablet, Take 1 tablet (20 mg total) by mouth daily as needed for edema. Take with potassium, Disp: 30 tablet, Rfl: 3   isosorbide mononitrate (IMDUR) 30 MG 24 hr tablet, Take 1 tablet (30 mg total) by mouth at bedtime., Disp: 90 tablet, Rfl: 3   loratadine (CLARITIN) 10 MG tablet, Take 10 mg by mouth daily. Pt takes over the counter Aller-itin for allergies., Disp: , Rfl:    Moringa Oleifera (MORINGA PO), Take 1,000 mg by mouth daily. Moringa an over the counter multi vitamin supplement., Disp: , Rfl:    Multiple Vitamin (MULTIVITAMIN) capsule, Take 1 capsule by mouth daily., Disp: , Rfl:    omeprazole (PRILOSEC) 20 MG capsule, TAKE 1 CAPSULE(20 MG) BY MOUTH DAILY, Disp: 90 capsule, Rfl: 3   phentermine (ADIPEX-P) 37.5 MG tablet, Take 1 tablet (37.5 mg total) by mouth daily before breakfast., Disp: 90 tablet, Rfl: 1   potassium chloride SA (KLOR-CON M) 20 MEQ tablet, Take 1 tablet (20 mEq total) by mouth daily., Disp: 30 tablet, Rfl: 3   varenicline (CHANTIX) 1 MG tablet, TAKE 1 TABLET BY MOUTH TWICE DAILY, Disp: 180 tablet, Rfl: 3   VENTOLIN HFA 108 (90 Base) MCG/ACT inhaler, INHALE 2 PUFFS INTO THE LUNGS EVERY 4 HOURS AS NEEDED FOR WHEEZING OR SHORTNESS OF BREATH, Disp: 18 g, Rfl: 1   Objective:     There were no vitals filed for this visit.    There is  no height or weight on file to calculate BMI.    Physical Exam:    ***   Electronically signed by:  Deborah Andrews D.[facility] 7:26 AM 12/03/22

## 2022-12-04 ENCOUNTER — Ambulatory Visit (INDEPENDENT_AMBULATORY_CARE_PROVIDER_SITE_OTHER): Payer: Medicaid Other

## 2022-12-04 ENCOUNTER — Ambulatory Visit (INDEPENDENT_AMBULATORY_CARE_PROVIDER_SITE_OTHER): Payer: Medicaid Other | Admitting: Sports Medicine

## 2022-12-04 VITALS — BP 132/84 | HR 94 | Ht 63.0 in | Wt 324.0 lb

## 2022-12-04 DIAGNOSIS — M25551 Pain in right hip: Secondary | ICD-10-CM | POA: Diagnosis not present

## 2022-12-04 DIAGNOSIS — M2550 Pain in unspecified joint: Secondary | ICD-10-CM

## 2022-12-04 LAB — FERRITIN: Ferritin: 114.7 ng/mL (ref 10.0–291.0)

## 2022-12-04 LAB — C-REACTIVE PROTEIN: CRP: 3.1 mg/dL (ref 0.5–20.0)

## 2022-12-04 LAB — URIC ACID: Uric Acid, Serum: 6.4 mg/dL (ref 2.4–7.0)

## 2022-12-04 LAB — TSH: TSH: 2.98 u[IU]/mL (ref 0.35–5.50)

## 2022-12-04 LAB — VITAMIN D 25 HYDROXY (VIT D DEFICIENCY, FRACTURES): VITD: 30.04 ng/mL (ref 30.00–100.00)

## 2022-12-04 LAB — SEDIMENTATION RATE: Sed Rate: 45 mm/hr — ABNORMAL HIGH (ref 0–20)

## 2022-12-04 MED ORDER — MELOXICAM 15 MG PO TABS
15.0000 mg | ORAL_TABLET | Freq: Every day | ORAL | 0 refills | Status: DC
Start: 2022-12-04 — End: 2023-10-28

## 2022-12-04 NOTE — Patient Instructions (Addendum)
Good to see you  - Start meloxicam 15 mg daily x2 weeks.  If still having pain after 2 weeks, complete 3rd-week of meloxicam. May use remaining meloxicam as needed once daily for pain control.  Do not to use additional NSAIDs while taking meloxicam.  May use Tylenol [phone] mg 2 to 3 times a day for breakthrough pain. Labs on the way out Pt referral  6 week follow up

## 2022-12-06 ENCOUNTER — Inpatient Hospital Stay: Payer: Medicaid Other

## 2022-12-06 ENCOUNTER — Inpatient Hospital Stay: Payer: Medicaid Other | Admitting: Hematology & Oncology

## 2022-12-07 ENCOUNTER — Ambulatory Visit (HOSPITAL_BASED_OUTPATIENT_CLINIC_OR_DEPARTMENT_OTHER): Payer: Medicaid Other | Attending: Pulmonary Disease | Admitting: Pulmonary Disease

## 2022-12-07 DIAGNOSIS — G4736 Sleep related hypoventilation in conditions classified elsewhere: Secondary | ICD-10-CM | POA: Insufficient documentation

## 2022-12-07 DIAGNOSIS — G4733 Obstructive sleep apnea (adult) (pediatric): Secondary | ICD-10-CM

## 2022-12-07 LAB — RHEUMATOID FACTOR: Rheumatoid fact SerPl-aCnc: <10 IU/mL (ref <14)

## 2022-12-07 LAB — CYCLIC CITRUL PEPTIDE ANTIBODY, IGG: Cyclic Citrullin Peptide Ab: <16 UNITS

## 2022-12-07 LAB — ANA: Anti Nuclear Antibody (ANA): NEGATIVE

## 2022-12-21 IMAGING — CT CT ABD-PELV W/ CM
2 of 5 series · 16 of 46 positions shown, 18 images · IV contrast (Omni 300)
Comparison: None Available.

CLINICAL DATA: Pelvic pain. Vaginal hysterectomy with bilateral
salpingectomy on 01/02/2022.

EXAM:
CT ABDOMEN AND PELVIS WITH CONTRAST
TECHNIQUE: Multidetector CT imaging of the abdomen and pelvis was performed
using the standard protocol following bolus administration of
intravenous contrast.

[Series 3: a/p w/ 5mm · axial · 0.98mm/px · z∈[+1160,+1630]mm · 13 of 109 slices shown, 15 images]
[im 8/109  soft-tissue]
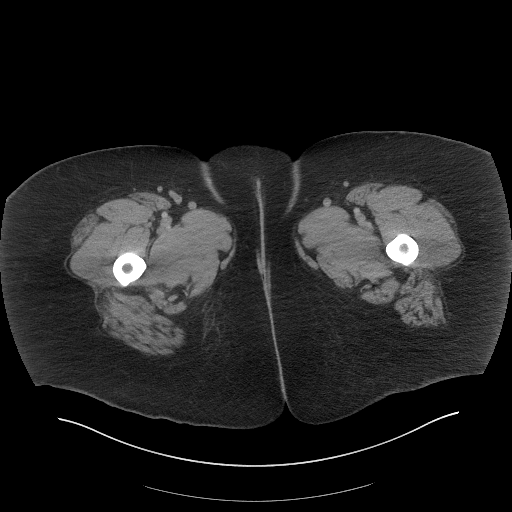
[im 8/109  bone]
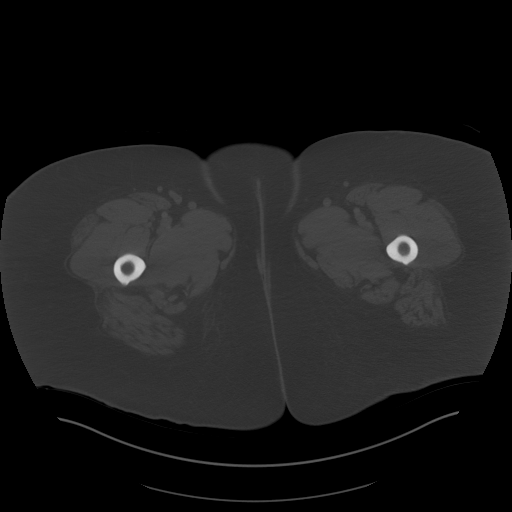
[im 15/109  soft-tissue]
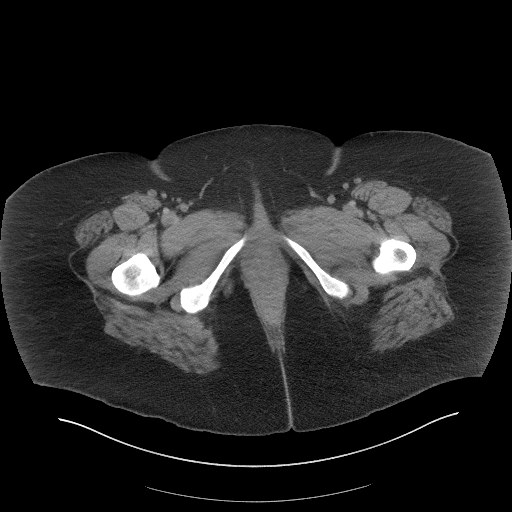
[im 22/109  soft-tissue]
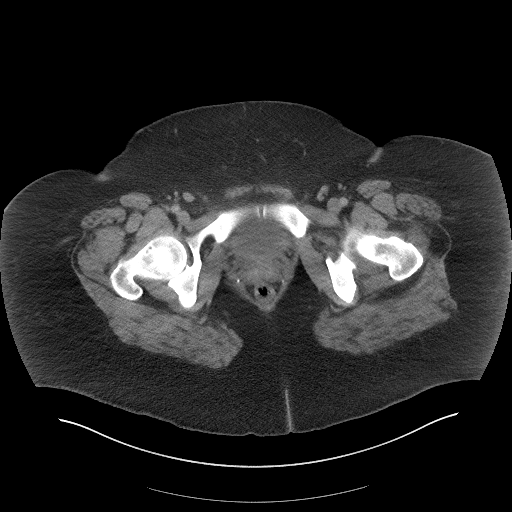
[im 29/109  soft-tissue]
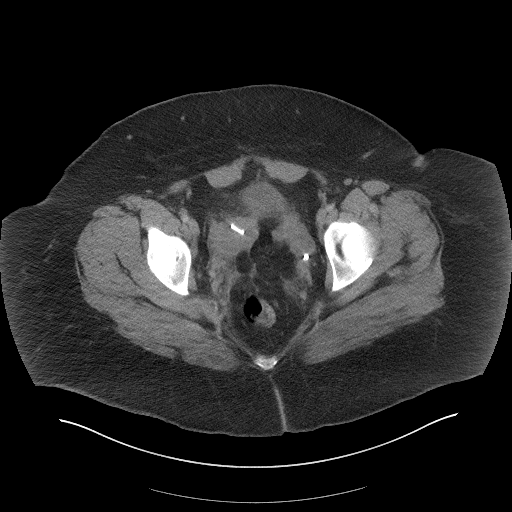
[im 37/109  soft-tissue]
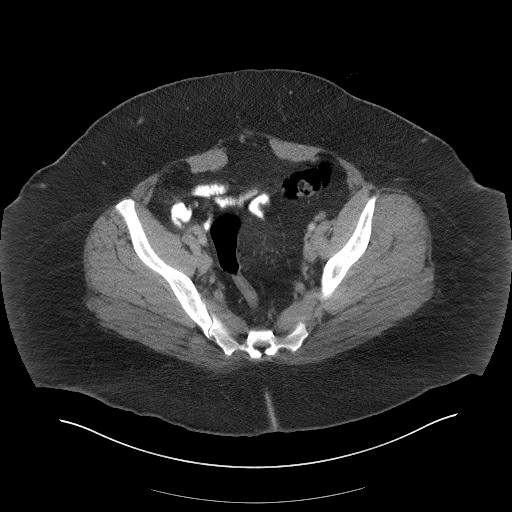
[im 44/109  soft-tissue]
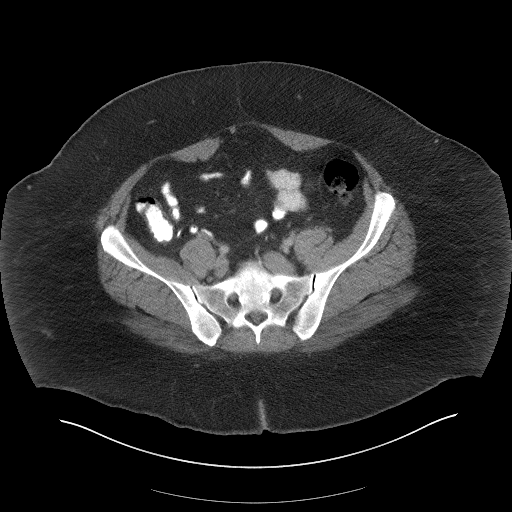
[im 58/109  soft-tissue]
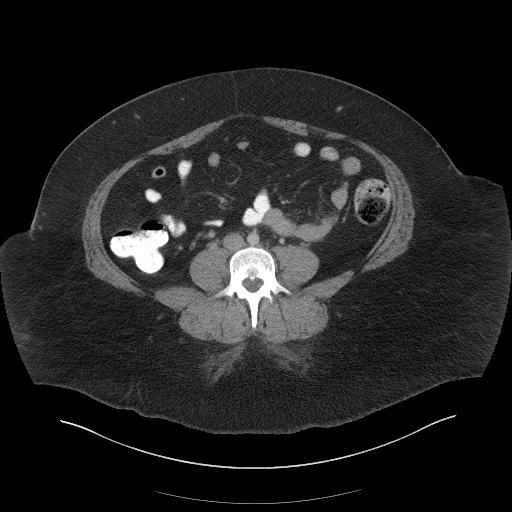
[im 65/109  soft-tissue]
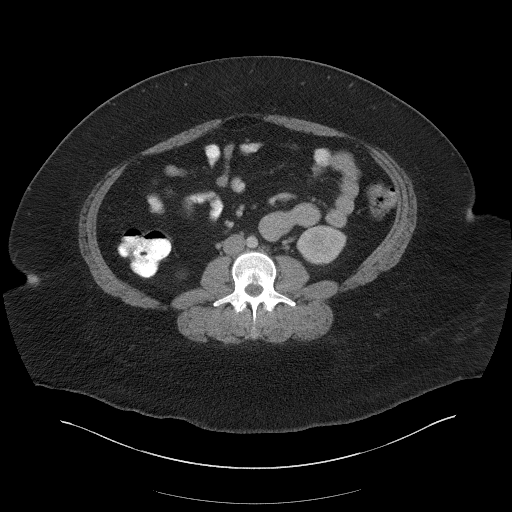
[im 73/109  soft-tissue]
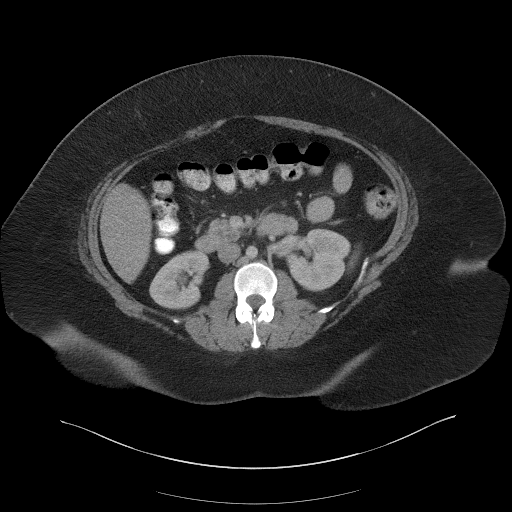
[im 73/109  bone]
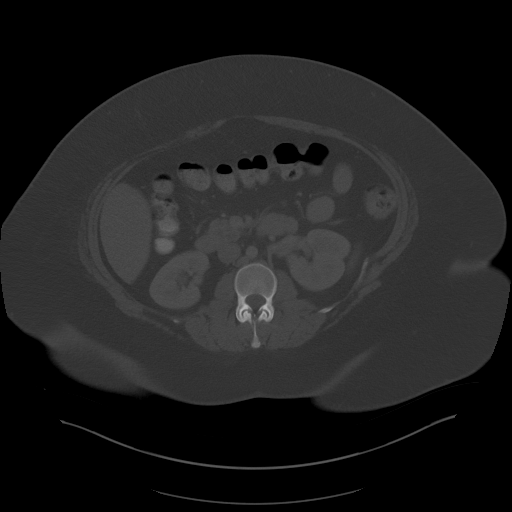
[im 80/109  soft-tissue]
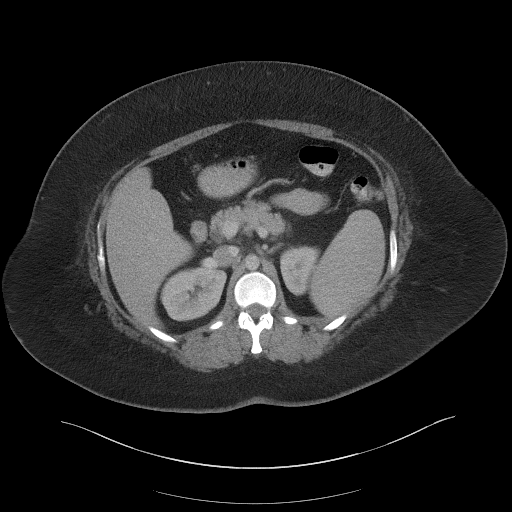
[im 87/109  soft-tissue]
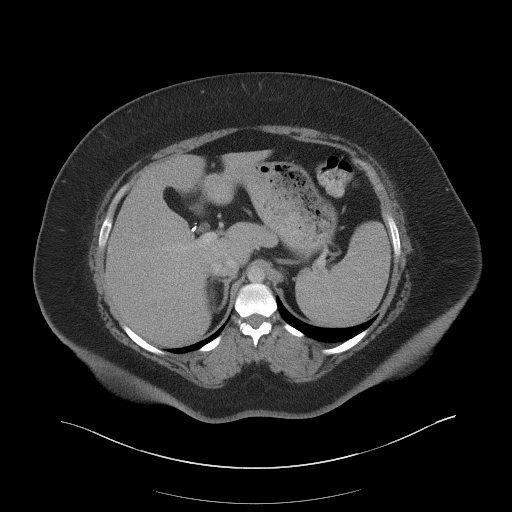
[im 94/109  soft-tissue]
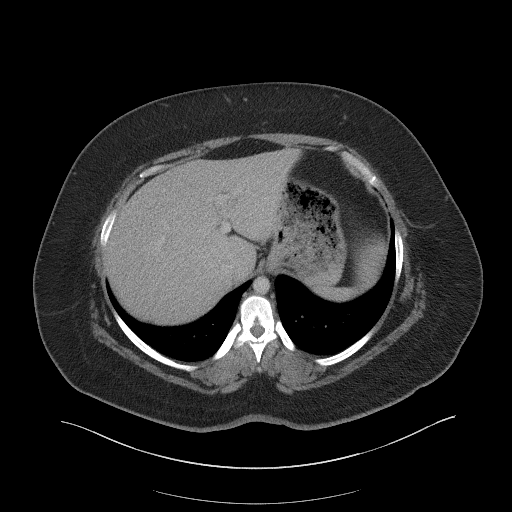
[im 101/109  soft-tissue]
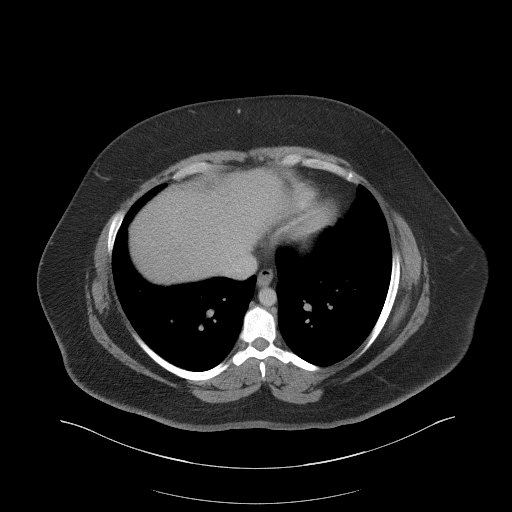

[Series 7: a/p w/ cor · coronal · 0.99mm/px · 3 of 185 slices shown]
[im 62/185  soft-tissue]
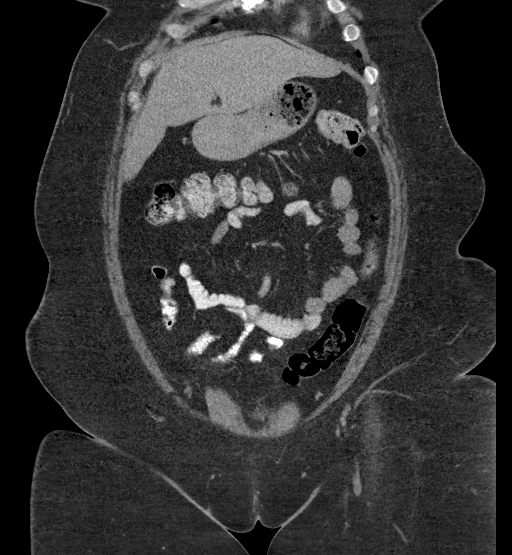
[im 82/185  soft-tissue]
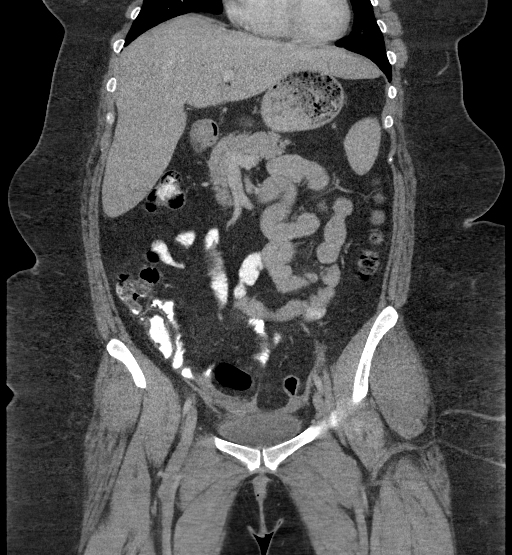
[im 103/185  soft-tissue]
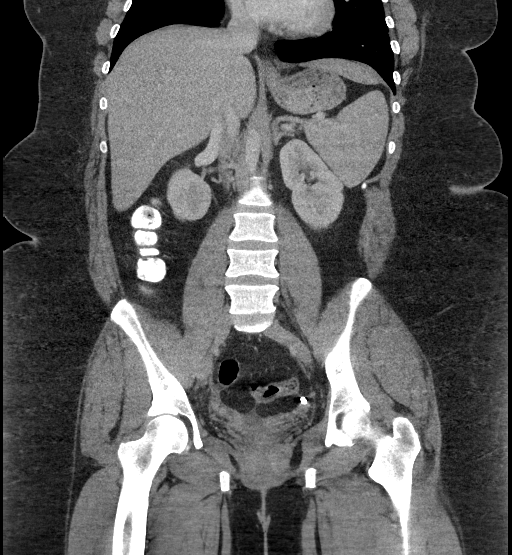

[16 of 46 positions shown; findings below may reference images not displayed]

RADIATION DOSE REDUCTION: This exam was performed according to the
departmental dose-optimization program which includes automated
exposure control, adjustment of the mA and/or kV according to
patient size and/or use of iterative reconstruction technique.

CONTRAST:  100mL OMNIPAQUE IOHEXOL 300 MG/ML  SOLN
FINDINGS: Lower chest: Unremarkable.

Hepatobiliary: No suspicious focal abnormality within the liver
parenchyma. Gallbladder is surgically absent. No intrahepatic or
extrahepatic biliary dilation.

Pancreas: No focal mass lesion. No dilatation of the main duct. No
intraparenchymal cyst. No peripancreatic edema.

Spleen: No splenomegaly. No focal mass lesion.

Adrenals/Urinary Tract: No adrenal nodule or mass. Kidneys
unremarkable. No evidence for hydroureter. The urinary bladder
appears normal for the degree of distention.

Stomach/Bowel: Stomach is moderately distended with food. Duodenum
is normally positioned as is the ligament of Treitz. No small bowel
wall thickening. No small bowel dilatation. The terminal ileum is
normal. The appendix is normal. No gross colonic mass. No colonic
wall thickening.

Vascular/Lymphatic: No abdominal aortic aneurysm. No abdominal
aortic atherosclerotic calcification. There is no gastrohepatic or
hepatoduodenal ligament lymphadenopathy. No retroperitoneal or
mesenteric lymphadenopathy. No pelvic sidewall lymphadenopathy.

Reproductive: Uterus surgically absent. Trace fluid in the central
pelvis, above the vaginal cuff is not unexpected 7 days after
surgery and may well be physiologic. No adnexal mass. No evidence
for pelvic hematoma or gross hemoperitoneum.

Other: None.

Musculoskeletal: No worrisome lytic or sclerotic osseous
abnormality.
IMPRESSION: 1. No acute findings in the abdomen or pelvis.
2. Trace fluid in the central pelvis, not unexpected 7 days after
surgery and may well be physiologic. There is no pelvic hematoma or
evidence for gross hemoperitoneum. No rim enhancing fluid collection
to suggest the presence of an abscess.
3. No hydroureteronephrosis. No extraperitoneal or retroperitoneal
fluid collection.

## 2022-12-25 ENCOUNTER — Telehealth: Payer: Self-pay | Admitting: Pulmonary Disease

## 2022-12-25 DIAGNOSIS — G4733 Obstructive sleep apnea (adult) (pediatric): Secondary | ICD-10-CM

## 2022-12-25 DIAGNOSIS — G4734 Idiopathic sleep related nonobstructive alveolar hypoventilation: Secondary | ICD-10-CM

## 2022-12-25 NOTE — Telephone Encounter (Signed)
Call patient  Sleep study result  Date of study: 12/07/2022  Impression: Negative study for significant obstructive sleep apnea Mild oxygen desaturations  Recommendation:  Clinical follow-up of symptoms  Evaluate for causes of nocturnal desaturations, repeat overnight oximetry on room air to assess for qualification for oxygen supplementation  Smoking cessation counseling  Evaluate for underlying lung disease, consider pulmonary function test  Follow-up as previously scheduled

## 2022-12-25 NOTE — Procedures (Signed)
POLYSOMNOGRAPHY  Last, First: Edelweiss, Guglielmi MRN: [id number] Gender: Female Age (years): 37 Weight (lbs): 320 DOB: 06-24-1986 BMI: 56 Primary Care: No PCP Epworth Score: 10 Referring: Tomma Lightning MD Technician: Rolene Arbour Interpreting: Tomma Lightning MD Study Type: NPSG Ordered Study Type: Split Night CPAP Study date: 12/07/2022 Location: [facility] CLINICAL INFORMATION Rhetta Wedlake is a 37 year old Female and was referred to the sleep center for evaluation of N/A. Indications include OSA.  MEDICATIONS Patient self administered medications include: N/A. Medications administered during study include No sleep medicine administered.  SLEEP STUDY TECHNIQUE A multi-channel overnight Polysomnography study was performed. The channels recorded and monitored were central and occipital EEG, electrooculogram (EOG), submentalis EMG (chin), nasal and oral airflow, thoracic and abdominal wall motion, anterior tibialis EMG, snore microphone, electrocardiogram, and a pulse oximetry. TECHNICIAN COMMENTS Comments added by Technician: none Comments added by Scorer: N/A SLEEP ARCHITECTURE The study was initiated at 10:14:52 PM and terminated at 4:30:35 AM. The total recorded time was 375.7 minutes. EEG confirmed total sleep time was 335.3 minutes yielding a sleep efficiency of 89.2%. Sleep onset after lights out was 24.9 minutes with a REM latency of 158.0 minutes. The patient spent 0.6% of the night in stage N1 sleep, 83.3% in stage N2 sleep, 6.4% in stage N3 and 9.7% in REM. Wake after sleep onset (WASO) was 15.5 minutes. The Arousal Index was 13.4/hour. RESPIRATORY PARAMETERS There were a total of 14 respiratory disturbances out of which 2 were apneas ( 0 obstructive, 0 mixed, 2 central) and 12 hypopneas. The apnea/hypopnea index (AHI) was 2.5 events/hour. The central sleep apnea index was 0.4 events/hour. The REM AHI was 16.6 events/hour and NREM AHI was 1.0 events/hour. The supine AHI  was 4.2 events/hour and the non supine AHI was 0 supine during 60.34% of sleep. Respiratory disturbances were associated with oxygen desaturation down to a nadir of 82.0% during sleep. The mean oxygen saturation during the study was 92.1%.   LEG MOVEMENT DATA The total leg movements were 54 with a resulting leg movement index of 9.7/hr .Associated arousal with leg movement index was 2.7/hr.  CARDIAC DATA The underlying cardiac rhythm was most consistent with sinus rhythm. Mean heart rate during sleep was 88.3 bpm. Additional rhythm abnormalities include None.  IMPRESSIONS - No Significant Obstructive Sleep apnea(OSA) - EKG showed no cardiac abnormalities. - Mild oxygen desaturation - The patient snored with soft snoring volume. - No significant periodic leg movements(PLMs) during sleep. However, no significant associated arousals.  DIAGNOSIS - Nocturnal Hypoxemia (G47.36)  RECOMMENDATIONS - Avoid alcohol, sedatives and other CNS depressants that may worsen sleep apnea and disrupt normal sleep architecture. - Follow-up assessment for nocturnal hypoxemia, encourage smoking cessation. Evaluate for underlying lung disease. - Sleep hygiene should be reviewed to assess factors that may improve sleep quality. - Weight management and regular exercise should be initiated or continued.  [Electronically signed] 12/25/2022 02:08 PM  Virl Diamond MD NPI: [id number]

## 2022-12-26 ENCOUNTER — Encounter: Payer: Self-pay | Admitting: Family Medicine

## 2022-12-28 ENCOUNTER — Ambulatory Visit: Payer: Medicaid Other

## 2022-12-31 ENCOUNTER — Other Ambulatory Visit: Payer: Self-pay | Admitting: Sports Medicine

## 2022-12-31 NOTE — Telephone Encounter (Signed)
Spoke with patient regarding sleep study result's  Date of study: 12/07/2022   Impression: Negative study for significant obstructive sleep apnea Mild oxygen desaturations   Recommendation:   Clinical follow-up of symptoms   Evaluate for causes of nocturnal desaturations, repeat overnight oximetry on room air to assess for qualification for oxygen supplementation   Smoking cessation counseling   Evaluate for underlying lung disease, consider pulmonary function test   Follow-up as previously scheduled  Patient was scheduled for a f/u with Beth on 02/06/23 and ONO was put in.   Patient's voice was understanding.  Nothing else further needed.

## 2023-01-07 NOTE — Progress Notes (Unsigned)
    Deborah Andrews D.[facility] [address] [place] [address] Phone: [phone]   Assessment and Plan:     There are no diagnoses linked to this encounter.  ***   Pertinent previous records reviewed include ***   Follow Up: ***     Subjective:   I, Deborah Andrews, am serving as a Neurosurgeon for Doctor Richardean Sale   Chief Complaint: hip pain    HPI:    12/04/2022 Patient is a 37 year old female complaining of hip pain. Patient states in 2011 she had hip pain and an orthopedic doctor in [place] diagnosed her with a fracture in the neck of her femur but there was nothing to be done for it. Since, she reports hip pain, hip joint going in and out and give way symptoms. She says she has not had any further therapy. It continues to be a problem. States that her hip is popping out of socket all the time, does endorse numbness and tingling, no meds for the pain, no radiating pain   01/08/2023 Patient states    Relevant Historical Information: Elevated BMI, OSA Additional pertinent review of systems negative.   Current Outpatient Medications:    furosemide (LASIX) 20 MG tablet, Take 1 tablet (20 mg total) by mouth daily as needed for edema. Take with potassium, Disp: 30 tablet, Rfl: 3   isosorbide mononitrate (IMDUR) 30 MG 24 hr tablet, Take 1 tablet (30 mg total) by mouth at bedtime., Disp: 90 tablet, Rfl: 3   loratadine (CLARITIN) 10 MG tablet, Take 10 mg by mouth daily. Pt takes over the counter Aller-itin for allergies., Disp: , Rfl:    meloxicam (MOBIC) 15 MG tablet, Take 1 tablet (15 mg total) by mouth daily., Disp: 30 tablet, Rfl: 0   Moringa Oleifera (MORINGA PO), Take 1,000 mg by mouth daily. Moringa an over the counter multi vitamin supplement., Disp: , Rfl:    Multiple Vitamin (MULTIVITAMIN) capsule, Take 1 capsule by mouth daily., Disp: , Rfl:    omeprazole (PRILOSEC) 20 MG capsule, TAKE 1 CAPSULE(20 MG) BY MOUTH DAILY, Disp: 90  capsule, Rfl: 3   phentermine (ADIPEX-P) 37.5 MG tablet, Take 1 tablet (37.5 mg total) by mouth daily before breakfast., Disp: 90 tablet, Rfl: 1   potassium chloride SA (KLOR-CON M) 20 MEQ tablet, Take 1 tablet (20 mEq total) by mouth daily., Disp: 30 tablet, Rfl: 3   varenicline (CHANTIX) 1 MG tablet, TAKE 1 TABLET BY MOUTH TWICE DAILY, Disp: 180 tablet, Rfl: 3   VENTOLIN HFA 108 (90 Base) MCG/ACT inhaler, INHALE 2 PUFFS INTO THE LUNGS EVERY 4 HOURS AS NEEDED FOR WHEEZING OR SHORTNESS OF BREATH, Disp: 18 g, Rfl: 1   Objective:     There were no vitals filed for this visit.    There is no height or weight on file to calculate BMI.    Physical Exam:    ***   Electronically signed by:  Deborah Andrews D[facility] 10:23 AM 01/07/23

## 2023-01-08 ENCOUNTER — Ambulatory Visit (INDEPENDENT_AMBULATORY_CARE_PROVIDER_SITE_OTHER): Payer: Medicaid Other

## 2023-01-08 ENCOUNTER — Ambulatory Visit (INDEPENDENT_AMBULATORY_CARE_PROVIDER_SITE_OTHER): Payer: Medicaid Other | Admitting: Sports Medicine

## 2023-01-08 VITALS — BP 132/82 | HR 89 | Ht 63.0 in | Wt 339.0 lb

## 2023-01-08 DIAGNOSIS — M25551 Pain in right hip: Secondary | ICD-10-CM

## 2023-01-08 DIAGNOSIS — M545 Low back pain, unspecified: Secondary | ICD-10-CM

## 2023-01-08 DIAGNOSIS — M2550 Pain in unspecified joint: Secondary | ICD-10-CM

## 2023-01-08 MED ORDER — METHYLPREDNISOLONE 4 MG PO TBPK
ORAL_TABLET | ORAL | 0 refills | Status: DC
Start: 2023-01-08 — End: 2023-01-09

## 2023-01-08 MED ORDER — METHYLPREDNISOLONE ACETATE 80 MG/ML IJ SUSP
80.0000 mg | Freq: Once | INTRAMUSCULAR | Status: AC
Start: 2023-01-08 — End: 2023-01-08
  Administered 2023-01-08: 80 mg via INTRAMUSCULAR

## 2023-01-08 MED ORDER — CYCLOBENZAPRINE HCL 5 MG PO TABS
5.0000 mg | ORAL_TABLET | Freq: Every day | ORAL | 0 refills | Status: DC
Start: 2023-01-08 — End: 2023-02-06

## 2023-01-08 MED ORDER — KETOROLAC TROMETHAMINE 60 MG/2ML IM SOLN
60.0000 mg | Freq: Once | INTRAMUSCULAR | Status: AC
Start: 2023-01-08 — End: 2023-01-08
  Administered 2023-01-08: 60 mg via INTRAMUSCULAR

## 2023-01-08 NOTE — Patient Instructions (Signed)
Discontinue meloxicam  Prednisone dos pak tomorrow  Flexeril 5-10 mg nightly as needed for muscle spasm  Tylenol [phone] mg 2-3 times a day for pain relief  Low back HEP 3 week follow up

## 2023-01-08 NOTE — Addendum Note (Signed)
Addended by: Evon Slack on: 01/08/2023 09:44 AM   Modules accepted: Orders

## 2023-01-09 ENCOUNTER — Encounter: Payer: Self-pay | Admitting: Family Medicine

## 2023-01-09 ENCOUNTER — Ambulatory Visit: Payer: Medicaid Other | Admitting: Obstetrics and Gynecology

## 2023-01-09 ENCOUNTER — Telehealth (INDEPENDENT_AMBULATORY_CARE_PROVIDER_SITE_OTHER): Payer: Medicaid Other | Admitting: Family Medicine

## 2023-01-09 VITALS — Ht 63.5 in | Wt 339.0 lb

## 2023-01-09 DIAGNOSIS — R072 Precordial pain: Secondary | ICD-10-CM | POA: Diagnosis not present

## 2023-01-09 DIAGNOSIS — F17210 Nicotine dependence, cigarettes, uncomplicated: Secondary | ICD-10-CM | POA: Diagnosis not present

## 2023-01-09 DIAGNOSIS — Z6837 Body mass index (BMI) 37.0-37.9, adult: Secondary | ICD-10-CM

## 2023-01-09 DIAGNOSIS — R0683 Snoring: Secondary | ICD-10-CM | POA: Diagnosis not present

## 2023-01-09 NOTE — Patient Instructions (Signed)
Please return in April 2025 for cpe.   If you have any questions or concerns, please don't hesitate to send me a message via MyChart or call the office at [phone]. Thank you for visiting with [place] today! It's our pleasure caring for you.

## 2023-01-09 NOTE — Progress Notes (Signed)
Virtual Visit via Video Note  Subjective  CC:  Chief Complaint  Patient presents with   Weight Check     I connected with Ron Parker on 01/09/23 at 11:00 AM EDT by a video enabled telemedicine application and verified that I am speaking with the correct person using two identifiers. Location patient: Home Location provider: [facility] [address] Office Persons participating in the virtual visit: Roetta Baughman, Willow Ora, MD Ninetta Lights CMA  I discussed the limitations of evaluation and management by telemedicine and the availability of in person appointments. The patient expressed understanding and agreed to proceed. HPI: Deborah Andrews is a 37 y.o. female who was contacted today to address the problems listed above in the chief complaint. Discussed weight. Eating less. Stopped sodas but not losing weight. Cards mentioned glp-1 but cost would be prohibitive. Hasn't used phentermine but has some palpitations. Discussed bariatric surgery options.  Snoring: reviewed negative sleep study.  Quit smoking in April. Great!!! Reviewed cards notes; taking imdur daily to control possible vasospasm pain.  Assessment  1. Precordial pain   2. Morbid obesity due to excess calories (HCC)   3. Snoring   4. Cigarette nicotine dependence without complication      Plan  Precordial pain:  continue imdur. Morbid obesity; discussed diet. Refer to bariatric surgery Snoring w/o OSA. Reassured Quit smoking. I discussed the assessment and treatment plan with the patient. The patient was provided an opportunity to ask questions and all were answered. The patient agreed with the plan and demonstrated an understanding of the instructions.   The patient was advised to call back or seek an in-person evaluation if the symptoms worsen or if the condition fails to improve as anticipated. Follow up: April 2025 for cpe Visit date not found  No orders of the defined  types were placed in this encounter.     I reviewed the patients updated PMH, FH, and SocHx.    Patient Active Problem List   Diagnosis Date Noted   Precordial pain 11/08/2022    Priority: High   Snoring 03/30/2022    Priority: High   At high risk for breast cancer 08/04/2020    Priority: High   Cigarette nicotine dependence without complication 06/07/2020    Priority: High   Morbid obesity due to excess calories (HCC) 06/07/2020    Priority: High   Depression, major, recurrent, in complete remission (HCC) 06/07/2020    Priority: High   Leukocytosis 11/08/2022    Priority: Medium    Dependent edema 04/20/2022    Priority: Medium    Syncope 2022 08/03/2021    Priority: Medium    Mild intermittent asthma without complication 06/07/2020    Priority: Medium    Left lower lobe pulmonary nodule 11/08/2022    Priority: Low   S/P vaginal hysterectomy 01/02/2022    Priority: Low   Herpes simplex 12/05/2020    Priority: Low   Genetic testing 07/27/2020    Priority: Low   Current Meds  Medication Sig   cyclobenzaprine (FLEXERIL) 5 MG tablet Take 1 tablet (5 mg total) by mouth at bedtime.   furosemide (LASIX) 20 MG tablet Take 1 tablet (20 mg total) by mouth daily as needed for edema. Take with potassium   isosorbide mononitrate (IMDUR) 30 MG 24 hr tablet Take 1 tablet (30 mg total) by mouth at bedtime.   loratadine (CLARITIN) 10 MG tablet Take 10 mg by mouth daily. Pt  takes over the counter Aller-itin for allergies.   meloxicam (MOBIC) 15 MG tablet Take 1 tablet (15 mg total) by mouth daily.   Moringa Oleifera (MORINGA PO) Take 1,000 mg by mouth daily. Moringa an over the counter multi vitamin supplement.   Multiple Vitamin (MULTIVITAMIN) capsule Take 1 capsule by mouth daily.   omeprazole (PRILOSEC) 20 MG capsule TAKE 1 CAPSULE(20 MG) BY MOUTH DAILY   potassium chloride SA (KLOR-CON M) 20 MEQ tablet Take 1 tablet (20 mEq total) by mouth daily.   varenicline (CHANTIX) 1 MG tablet  TAKE 1 TABLET BY MOUTH TWICE DAILY   VENTOLIN HFA 108 (90 Base) MCG/ACT inhaler INHALE 2 PUFFS INTO THE LUNGS EVERY 4 HOURS AS NEEDED FOR WHEEZING OR SHORTNESS OF BREATH   [DISCONTINUED] methylPREDNISolone (MEDROL DOSEPAK) 4 MG TBPK tablet Take 6 tablets on day 1.  Take 5 tablets on day 2.  Take 4 tablets on day 3.  Take 3 tablets on day 4.  Take 2 tablets on day 5.  Take 1 tablet on day 6.    Allergies: Patient has No Known Allergies. Family History: Patient family history includes Breast cancer in her mother; Breast cancer (age of onset: 65) in her maternal uncle; Cancer in her maternal grandfather; Cancer (age of onset: 87) in her mother; Cancer - Other (age of onset: 83) in her mother; Cervical cancer (age of onset: 78) in her half-sister; Diabetes in her father and mother; Hypertension in her mother; Leukemia (age of onset: 2) in her half-brother; Melanoma in her maternal grandfather and maternal grandmother; Skin cancer in her maternal aunt. Social History:  Patient  reports that she has been smoking cigarettes. She has a 88.00 pack-year smoking history. She has never used smokeless tobacco. She reports current alcohol use. She reports that she does not use drugs.  Review of Systems: Constitutional: Negative for fever malaise or anorexia Cardiovascular: negative for chest pain Respiratory: negative for SOB or persistent cough Gastrointestinal: negative for abdominal pain  OBJECTIVE Vitals: Ht 5' 3.5" (1.613 m)   Wt (!) 339 lb (153.8 kg)   LMP 12/14/2021   BMI 59.11 kg/m  General: no acute distress , A&Ox3  Willow Ora, MD

## 2023-01-28 NOTE — Progress Notes (Deleted)
    Deborah Andrews D.[facility] [address] [place] [address] Phone: [phone]   Assessment and Plan:     There are no diagnoses linked to this encounter.  ***   Pertinent previous records reviewed include ***   Follow Up: ***     Subjective:   I, Deborah Andrews, am serving as a Neurosurgeon for Doctor Richardean Sale   Chief Complaint: hip pain    HPI:    12/04/2022 Patient is a 37 year old female complaining of hip pain. Patient states in 2011 she had hip pain and an orthopedic doctor in [place] diagnosed her with a fracture in the neck of her femur but there was nothing to be done for it. Since, she reports hip pain, hip joint going in and out and give way symptoms. She says she has not had any further therapy. It continues to be a problem. States that her hip is popping out of socket all the time, does endorse numbness and tingling, no meds for the pain, no radiating pain    01/08/2023 Patient states that her back is out, she is able to walk now before she wasn't able to    01/29/2023 Patient states   Relevant Historical Information: Elevated BMI, OSA  Additional pertinent review of systems negative.   Current Outpatient Medications:    cyclobenzaprine (FLEXERIL) 5 MG tablet, Take 1 tablet (5 mg total) by mouth at bedtime., Disp: 30 tablet, Rfl: 0   furosemide (LASIX) 20 MG tablet, Take 1 tablet (20 mg total) by mouth daily as needed for edema. Take with potassium, Disp: 30 tablet, Rfl: 3   isosorbide mononitrate (IMDUR) 30 MG 24 hr tablet, Take 1 tablet (30 mg total) by mouth at bedtime., Disp: 90 tablet, Rfl: 3   loratadine (CLARITIN) 10 MG tablet, Take 10 mg by mouth daily. Pt takes over the counter Aller-itin for allergies., Disp: , Rfl:    meloxicam (MOBIC) 15 MG tablet, Take 1 tablet (15 mg total) by mouth daily., Disp: 30 tablet, Rfl: 0   Moringa Oleifera (MORINGA PO), Take 1,000 mg by mouth daily. Moringa an over the counter  multi vitamin supplement., Disp: , Rfl:    Multiple Vitamin (MULTIVITAMIN) capsule, Take 1 capsule by mouth daily., Disp: , Rfl:    omeprazole (PRILOSEC) 20 MG capsule, TAKE 1 CAPSULE(20 MG) BY MOUTH DAILY, Disp: 90 capsule, Rfl: 3   phentermine (ADIPEX-P) 37.5 MG tablet, Take 1 tablet (37.5 mg total) by mouth daily before breakfast. (Patient not taking: Reported on 01/09/2023), Disp: 90 tablet, Rfl: 1   potassium chloride SA (KLOR-CON M) 20 MEQ tablet, Take 1 tablet (20 mEq total) by mouth daily., Disp: 30 tablet, Rfl: 3   varenicline (CHANTIX) 1 MG tablet, TAKE 1 TABLET BY MOUTH TWICE DAILY, Disp: 180 tablet, Rfl: 3   VENTOLIN HFA 108 (90 Base) MCG/ACT inhaler, INHALE 2 PUFFS INTO THE LUNGS EVERY 4 HOURS AS NEEDED FOR WHEEZING OR SHORTNESS OF BREATH, Disp: 18 g, Rfl: 1   Objective:     There were no vitals filed for this visit.    There is no height or weight on file to calculate BMI.    Physical Exam:    ***   Electronically signed by:  Deborah Andrews [facility] 7:38 AM 01/28/23

## 2023-01-29 ENCOUNTER — Ambulatory Visit: Payer: Medicaid Other | Admitting: Sports Medicine

## 2023-02-04 ENCOUNTER — Ambulatory Visit (INDEPENDENT_AMBULATORY_CARE_PROVIDER_SITE_OTHER): Payer: Medicaid Other | Admitting: Obstetrics and Gynecology

## 2023-02-04 ENCOUNTER — Other Ambulatory Visit: Payer: Self-pay

## 2023-02-04 ENCOUNTER — Other Ambulatory Visit (HOSPITAL_COMMUNITY)
Admission: RE | Admit: 2023-02-04 | Discharge: 2023-02-04 | Disposition: A | Discharge status: Home / Self Care | Payer: Medicaid Other | Source: Ambulatory Visit | Attending: Obstetrics and Gynecology | Admitting: Obstetrics and Gynecology

## 2023-02-04 VITALS — BP 112/88 | HR 88 | Ht 63.5 in | Wt 334.0 lb

## 2023-02-04 DIAGNOSIS — N870 Mild cervical dysplasia: Secondary | ICD-10-CM | POA: Insufficient documentation

## 2023-02-04 DIAGNOSIS — Z90710 Acquired absence of both cervix and uterus: Secondary | ICD-10-CM | POA: Insufficient documentation

## 2023-02-04 NOTE — Progress Notes (Signed)
  CC: pap smear Subjective:    Patient ID: Deborah Andrews, female    DOB: 1986-02-22, 37 y.o.   MRN: [id number]  HPI 37 yo G2P2 seen for post hyst pap.  Pt had  CIN 1 in the past.  Pap taken today for surveillance.  Reviewed guidelines after pap taken and noted most suggest no pap    Review of Systems     Objective:   Physical Exam Vitals:   02/04/23 1609  BP: 112/88  Pulse: 88         Assessment & Plan:   1. Dysplasia of cervix, low grade (CIN 1) Pap taken, but unless high grade dysplasia will not pursue any further evaluation. - Cytology - PAP  2. S/P vaginal hysterectomy  - Cytology - PAP    Warden Fillers, MD Faculty Attending, [facility]

## 2023-02-06 ENCOUNTER — Ambulatory Visit (INDEPENDENT_AMBULATORY_CARE_PROVIDER_SITE_OTHER): Payer: Medicaid Other | Admitting: Primary Care

## 2023-02-06 ENCOUNTER — Encounter: Payer: Self-pay | Admitting: Primary Care

## 2023-02-06 VITALS — BP 122/82 | HR 66 | Ht 63.5 in | Wt 339.2 lb

## 2023-02-06 DIAGNOSIS — R000 Tachycardia, unspecified: Secondary | ICD-10-CM | POA: Diagnosis not present

## 2023-02-06 DIAGNOSIS — J4520 Mild intermittent asthma, uncomplicated: Secondary | ICD-10-CM | POA: Diagnosis not present

## 2023-02-06 DIAGNOSIS — R0609 Other forms of dyspnea: Secondary | ICD-10-CM

## 2023-02-06 DIAGNOSIS — Z8669 Personal history of other diseases of the nervous system and sense organs: Secondary | ICD-10-CM | POA: Diagnosis not present

## 2023-02-06 DIAGNOSIS — R42 Dizziness and giddiness: Secondary | ICD-10-CM | POA: Diagnosis not present

## 2023-02-06 MED ORDER — TRAZODONE HCL 50 MG PO TABS: 50.0000 mg | ORAL_TABLET | Freq: Every evening | ORAL | 2 refills | Status: DC | PRN

## 2023-02-06 NOTE — Patient Instructions (Signed)
Recommendations: - Start Trazodone - take 1/2 tablet at bedtime x 3 days; if tolerates then increase to 1 tablet at bedtime for insomnia (do not take with alcohol or sedatives)   - Sleep with head of bed elevated  - Work on weight loss, call insurance and see what weight loss injection they covere and speak with your PCP  - We will get back to you about ONO results and if need oxygen at night we will order  - For now holding off on repeating sleep study. Ill touch base with Dr. Wynona Neat   Refer: Cardiology re: tachycardia/dyspnea/dizziness   Follow-up: - 3 months with Dr. Wynona Neat

## 2023-02-06 NOTE — Progress Notes (Addendum)
@Patient  ID: Deborah Andrews, female    DOB: 1986-07-15, 37 y.o.   MRN: [id number]  Chief Complaint  Patient presents with   Follow-up    Referring provider: Willow Ora, MD  HPI: 37 year old female.  Medical history significant for asthma, left lower lobe pulmonary nodule, hysterectomy, snoring, obesity.  02/06/2023 Patient presents today for sleep follow-up. She has a history of sleep apnea, diagnosed as a child. She is not getting quality sleep.Goes to bed at 2am and gets up at 5am. Patient saw Dr. Wynona Neat in February 2024. HST 12/07/22 was negative for significant obstructive sleep apnea, mild oxygen desaturations. Ordered for ONO, results are pending review. PFTs were normal in October 2023.  Feels her breathing is worse since she stopped smoking. She has gained 40 lbs since April after she quit smoking. She is having trouble breathing with exertion. She gets out of breath easily. She is not able to go shopping without using a motor scooter. She is taking lasix 20mg  daily as needed for leg swelling. She takes omeprazole 20mg  daily with significant improvement in GERD. Denies fever, chills, chest tightness, cough or wheezing.    No Known Allergies  Immunization History  Administered Date(s) Administered   Tdap 02/21/2016    Past Medical History:  Diagnosis Date   ADHD (attention deficit hyperactivity disorder)    Allergy    Anxiety    Apnea    Arthritis    Asthma    Atypical chest pain    Complication of anesthesia    Wakes up angry and mean   Depression    Dysplasia of cervix, low grade (CIN 1) 11/09/2020   abnl pap 09/2020; colpo 04/2021 cin 1; repeat pap in 1 year per Dr. 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 Valtrex prn outbreaks   Menorrhagia with regular cycle 11/09/2020   Normal endometrial biopsy 04/2021, Dr. Donavan Foil,  gyn   Sleep apnea    done in [place] per pt, does not wear CPAP    Tobacco History: Social History   Tobacco Use  Smoking Status Former   Current packs/day: 0.00   Average packs/day: 4.0 packs/day for 22.0 years (88.0 ttl pk-yrs)   Types: Cigarettes   Quit date: 10/2022   Years since quitting: 0.3  Smokeless Tobacco Never  Tobacco Comments   Currently smoking 1ppd as of 03/30/22 ep   Counseling given: Not Answered Tobacco comments: Currently smoking 1ppd as of 03/30/22 ep   Outpatient Medications Prior to Visit  Medication Sig Dispense Refill   furosemide (LASIX) 20 MG tablet Take 1 tablet (20 mg total) by mouth daily as needed for edema. Take with potassium 30 tablet 3   isosorbide mononitrate (IMDUR) 30 MG 24 hr tablet Take 1 tablet (30 mg total) by mouth at bedtime. 90 tablet 3   loratadine (CLARITIN) 10 MG tablet Take 10 mg by mouth daily. Pt takes over the counter Aller-itin for allergies.     meloxicam (MOBIC) 15 MG tablet Take 1 tablet (15 mg total) by mouth daily. 30 tablet 0   Moringa Oleifera (MORINGA PO) Take 1,000 mg by mouth daily. Moringa an over the counter multi vitamin supplement.     Multiple Vitamin (MULTIVITAMIN) capsule Take 1 capsule by mouth daily.     omeprazole (PRILOSEC) 20 MG capsule TAKE 1 CAPSULE(20 MG) BY MOUTH  DAILY 90 capsule 3   potassium chloride SA (KLOR-CON M) 20 MEQ tablet Take 1 tablet (20 mEq total) by mouth daily. 30 tablet 3   varenicline (CHANTIX) 1 MG tablet TAKE 1 TABLET BY MOUTH TWICE DAILY 180 tablet 3   VENTOLIN HFA 108 (90 Base) MCG/ACT inhaler INHALE 2 PUFFS INTO THE LUNGS EVERY 4 HOURS AS NEEDED FOR WHEEZING OR SHORTNESS OF BREATH 18 g 1   cyclobenzaprine (FLEXERIL) 5 MG tablet Take 1 tablet (5 mg total) by mouth at bedtime. 30 tablet 0   phentermine (ADIPEX-P) 37.5 MG tablet Take 1 tablet (37.5 mg total) by mouth daily before breakfast. 90 tablet 1   No facility-administered medications prior to visit.    Review of  Systems  Review of Systems  Constitutional:  Positive for unexpected weight change.  Respiratory:  Positive for shortness of breath.   Psychiatric/Behavioral:  Positive for sleep disturbance.    Physical Exam  BP 122/82 (BP Location: Left Arm, Patient Position: Sitting, Cuff Size: Large)   Pulse 66   Ht 5' 3.5" (1.613 m)   Wt (!) 339 lb 3.2 oz (153.9 kg)   LMP 12/14/2021   SpO2 98%   BMI 59.14 kg/m  Physical Exam Constitutional:      Appearance: Normal appearance. She is obese.  HENT:     Mouth/Throat:     Mouth: Mucous membranes are moist.     Pharynx: Oropharynx is clear.  Cardiovascular:     Rate and Rhythm: Normal rate and regular rhythm.  Pulmonary:     Effort: Pulmonary effort is normal.     Breath sounds: Normal breath sounds.  Musculoskeletal:        General: Normal range of motion.  Skin:    General: Skin is warm and dry.  Neurological:     General: No focal deficit present.     Mental Status: She is alert and oriented to person, place, and time. Mental status is at baseline.  Psychiatric:        Mood and Affect: Mood normal.        Behavior: Behavior normal.        Thought Content: Thought content normal.        Judgment: Judgment normal.      Lab Results:  CBC    Component Value Date/Time   WBC 11.3 (H) 11/13/2022 1021   RBC 4.64 11/13/2022 1021   HGB 13.8 11/13/2022 1021   HCT 41.4 11/13/2022 1021   PLT 381.0 11/13/2022 1021   MCV 89.2 11/13/2022 1021   MCH 29.9 01/09/2022 0019   MCHC 33.4 11/13/2022 1021   RDW 14.1 11/13/2022 1021   LYMPHSABS 2.9 11/13/2022 1021   MONOABS 0.5 11/13/2022 1021   EOSABS 0.1 11/13/2022 1021   BASOSABS 0.1 11/13/2022 1021    BMET    Component Value Date/Time   NA 137 11/08/2022 1047   K 4.6 11/08/2022 1047   CL 104 11/08/2022 1047   CO2 25 11/08/2022 1047   GLUCOSE 89 11/08/2022 1047   BUN 8 11/08/2022 1047   CREATININE 0.83 11/08/2022 1047   CALCIUM 9.4 11/08/2022 1047   GFRNONAA >60 01/09/2022 0220     BNP No results found for: "BNP"  ProBNP No results found for: "PROBNP"  Imaging: No results found.   Assessment & Plan:   Insomnia - Start Trazdone 25-50mg  at bedtime   Mild intermittent asthma without complication - Continue PRN Ventolin HFA 2 puffs every 6 hours   Dyspnea - Likely  due to obesity and deconditioning - PFT normal in October 2023 - Referring to cardiology for evaluation dyspnea/tachycardia and dizziness   GERD (gastroesophageal reflux disease) - Improved; Continue omeprazole 20mg  daily  Hx of sleep apnea - Patient reports not getting good quality sleep - HST 12/07/22 was negative for significant obstructive sleep apnea, mild oxygen desaturations - Ordered for ONO - Consider getting repeat in-sleep study vs ABGs to qualify for NIV   Recommendations: - Start Trazodone - take 1/2 tablet at bedtime x 3 days; if tolerates then increase to 1 tablet at bedtime for insomnia (do not take with alcohol or sedatives)   - Sleep with head of bed elevated  - Work on weight loss, call insurance and see what weight loss injection they covere and speak with your PCP  - We will get back to you about ONO results and if need oxygen at night we will order  - For now holding off on repeating sleep study. Ill touch base with Dr. Wynona Neat   Refer: Cardiology re: tachycardia/dyspnea/dizziness   Follow-up: - 3 months with Dr. Daleen Squibb, NP 02/11/2023

## 2023-02-07 LAB — CYTOLOGY - PAP: Diagnosis: NEGATIVE

## 2023-02-11 DIAGNOSIS — R0600 Dyspnea, unspecified: Secondary | ICD-10-CM | POA: Insufficient documentation

## 2023-02-11 DIAGNOSIS — Z8669 Personal history of other diseases of the nervous system and sense organs: Secondary | ICD-10-CM | POA: Insufficient documentation

## 2023-02-11 DIAGNOSIS — G4700 Insomnia, unspecified: Secondary | ICD-10-CM | POA: Insufficient documentation

## 2023-02-11 DIAGNOSIS — K219 Gastro-esophageal reflux disease without esophagitis: Secondary | ICD-10-CM | POA: Insufficient documentation

## 2023-02-11 NOTE — Assessment & Plan Note (Addendum)
-   Patient reports not getting good quality sleep - HST 12/07/22 was negative for significant obstructive sleep apnea, mild oxygen desaturations - Ordered for ONO - Consider getting repeat in-sleep study vs ABGs to qualify for NIV

## 2023-02-11 NOTE — Assessment & Plan Note (Addendum)
-   Start Trazdone 25-50mg  at bedtime

## 2023-02-11 NOTE — Assessment & Plan Note (Addendum)
-   Likely due to obesity and deconditioning - PFT normal in October 2023 - Referring to cardiology for evaluation dyspnea/tachycardia and dizziness

## 2023-02-11 NOTE — Assessment & Plan Note (Addendum)
-   Continue PRN Ventolin HFA 2 puffs every 6 hours

## 2023-02-11 NOTE — Assessment & Plan Note (Signed)
Improved.  Continue omeprazole 20 mg daily.

## 2023-02-27 DIAGNOSIS — R102 Pelvic and perineal pain: Secondary | ICD-10-CM | POA: Diagnosis not present

## 2023-02-27 DIAGNOSIS — W19XXXA Unspecified fall, initial encounter: Secondary | ICD-10-CM | POA: Diagnosis not present

## 2023-02-27 DIAGNOSIS — Z043 Encounter for examination and observation following other accident: Secondary | ICD-10-CM | POA: Diagnosis not present

## 2023-02-27 DIAGNOSIS — S0990XA Unspecified injury of head, initial encounter: Secondary | ICD-10-CM | POA: Diagnosis not present

## 2023-02-27 DIAGNOSIS — S29009A Unspecified injury of muscle and tendon of unspecified wall of thorax, initial encounter: Secondary | ICD-10-CM | POA: Diagnosis not present

## 2023-02-27 DIAGNOSIS — R519 Headache, unspecified: Secondary | ICD-10-CM | POA: Diagnosis not present

## 2023-02-27 DIAGNOSIS — W1839XA Other fall on same level, initial encounter: Secondary | ICD-10-CM | POA: Diagnosis not present

## 2023-02-27 DIAGNOSIS — R0689 Other abnormalities of breathing: Secondary | ICD-10-CM | POA: Diagnosis not present

## 2023-03-07 ENCOUNTER — Ambulatory Visit: Payer: Medicaid Other | Admitting: Primary Care

## 2023-04-09 ENCOUNTER — Ambulatory Visit: Payer: Medicaid Other | Admitting: Primary Care

## 2023-04-09 ENCOUNTER — Ambulatory Visit: Payer: Medicaid Other | Admitting: Physician Assistant

## 2023-07-29 ENCOUNTER — Telehealth: Payer: Self-pay | Admitting: Primary Care

## 2023-07-29 NOTE — Telephone Encounter (Addendum)
 Overnight oximetry test 01/03/2023 on room air SpO2 low 81%, highest O2 99% with average 93%.  Patient spent 0 minutes with an oxygen level less than 80%.  Longest continuous time with saturation less than 88% was 48 seconds which started at 4:56 AM.  Mild oxygen desaturations, supplemental oxygen not required at this time.

## 2023-10-18 ENCOUNTER — Other Ambulatory Visit: Payer: Self-pay

## 2023-10-18 ENCOUNTER — Emergency Department (HOSPITAL_COMMUNITY)

## 2023-10-18 ENCOUNTER — Emergency Department (HOSPITAL_COMMUNITY)
Admission: EM | Admit: 2023-10-18 | Discharge: 2023-10-18 | Disposition: A | Discharge status: Home / Self Care | Attending: Emergency Medicine | Admitting: Emergency Medicine

## 2023-10-18 ENCOUNTER — Encounter (HOSPITAL_COMMUNITY): Payer: Self-pay | Admitting: Emergency Medicine

## 2023-10-18 DIAGNOSIS — Z79899 Other long term (current) drug therapy: Secondary | ICD-10-CM | POA: Diagnosis not present

## 2023-10-18 DIAGNOSIS — I493 Ventricular premature depolarization: Secondary | ICD-10-CM | POA: Insufficient documentation

## 2023-10-18 DIAGNOSIS — R079 Chest pain, unspecified: Secondary | ICD-10-CM | POA: Diagnosis not present

## 2023-10-18 DIAGNOSIS — R0789 Other chest pain: Secondary | ICD-10-CM | POA: Diagnosis not present

## 2023-10-18 DIAGNOSIS — Z7951 Long term (current) use of inhaled steroids: Secondary | ICD-10-CM | POA: Insufficient documentation

## 2023-10-18 DIAGNOSIS — J45909 Unspecified asthma, uncomplicated: Secondary | ICD-10-CM | POA: Insufficient documentation

## 2023-10-18 LAB — TROPONIN I (HIGH SENSITIVITY)
Troponin I (High Sensitivity): 7 ng/L (ref <18)
Troponin I (High Sensitivity): 7 ng/L (ref <18)

## 2023-10-18 LAB — CBC WITH DIFFERENTIAL/PLATELET
Abs Immature Granulocytes: 0.04 10*3/uL (ref 0.00–0.07)
Basophils Absolute: 0.1 10*3/uL (ref 0.0–0.1)
Basophils Relative: 1 %
Eosinophils Absolute: 0.1 10*3/uL (ref 0.0–0.5)
Eosinophils Relative: 1 %
HCT: 41.5 % (ref 36.0–46.0)
Hemoglobin: 13.4 g/dL (ref 12.0–15.0)
Immature Granulocytes: 0 %
Lymphocytes Relative: 27 %
Lymphs Abs: 3.3 10*3/uL (ref 0.7–4.0)
MCH: 29.3 pg (ref 26.0–34.0)
MCHC: 32.3 g/dL (ref 30.0–36.0)
MCV: 90.6 fL (ref 80.0–100.0)
Monocytes Absolute: 0.6 10*3/uL (ref 0.1–1.0)
Monocytes Relative: 5 %
Neutro Abs: 8.4 10*3/uL — ABNORMAL HIGH (ref 1.7–7.7)
Neutrophils Relative %: 66 %
Platelets: 425 10*3/uL — ABNORMAL HIGH (ref 150–400)
RBC: 4.58 MIL/uL (ref 3.87–5.11)
RDW: 13.8 % (ref 11.5–15.5)
WBC: 12.4 10*3/uL — ABNORMAL HIGH (ref 4.0–10.5)
nRBC: 0 % (ref 0.0–0.2)

## 2023-10-18 LAB — COMPREHENSIVE METABOLIC PANEL WITH GFR
ALT: 18 U/L (ref 0–44)
AST: 27 U/L (ref 15–41)
Albumin: 3.4 g/dL — ABNORMAL LOW (ref 3.5–5.0)
Alkaline Phosphatase: 43 U/L (ref 38–126)
Anion gap: 9 (ref 5–15)
BUN: 5 mg/dL — ABNORMAL LOW (ref 6–20)
CO2: 24 mmol/L (ref 22–32)
Calcium: 9.1 mg/dL (ref 8.9–10.3)
Chloride: 105 mmol/L (ref 98–111)
Creatinine, Ser: 0.88 mg/dL (ref 0.44–1.00)
GFR, Estimated: >60 mL/min (ref >60)
Glucose, Bld: 89 mg/dL (ref 70–99)
Potassium: 3.5 mmol/L (ref 3.5–5.1)
Sodium: 138 mmol/L (ref 135–145)
Total Bilirubin: 0.9 mg/dL (ref 0.0–1.2)
Total Protein: 6.9 g/dL (ref 6.5–8.1)

## 2023-10-18 LAB — D-DIMER, QUANTITATIVE: D-Dimer, Quant: <0.27 ug{FEU}/mL (ref 0.00–0.50)

## 2023-10-18 LAB — LIPASE, BLOOD: Lipase: 24 U/L (ref 11–51)

## 2023-10-18 NOTE — ED Provider Notes (Signed)
 [place] EMERGENCY DEPARTMENT AT [facility] Provider Note   CSN: [id number] Arrival date & time: 10/18/23  1709     History  Chief Complaint  Patient presents with   Chest Pain    Deborah Andrews is a 38 y.o. female.  Patient complains of chest pain that started today.  Patient reports that she has a past medical history of her heart stopping beating for no reason.  Patient reports when she delivered her children her heart stopped twice with 1 child and 3 times with the other child.  Patient states that she has been told that she has abnormal beats.  Patient has a past medical history of asthma reflux.  Patient has had a hysterectomy.  The history is provided by the patient. No language interpreter was used.  Chest Pain      Home Medications Prior to Admission medications   Medication Sig Start Date End Date Taking? Authorizing Provider  furosemide (LASIX) 20 MG tablet Take 1 tablet (20 mg total) by mouth daily as needed for edema. Take with potassium 03/14/22   Willow Ora, MD  isosorbide mononitrate (IMDUR) 30 MG 24 hr tablet Take 1 tablet (30 mg total) by mouth at bedtime. 07/30/22   Orbie Pyo, MD  loratadine (CLARITIN) 10 MG tablet Take 10 mg by mouth daily. Pt takes over the counter Aller-itin for allergies.    [provider]  meloxicam (MOBIC) 15 MG tablet Take 1 tablet (15 mg total) by mouth daily. 12/04/22   Richardean Sale, DO  Moringa Oleifera (MORINGA PO) Take 1,000 mg by mouth daily. Moringa an over the counter multi vitamin supplement.    [provider]  Multiple Vitamin (MULTIVITAMIN) capsule Take 1 capsule by mouth daily.    [provider]  omeprazole (PRILOSEC) 20 MG capsule TAKE 1 CAPSULE(20 MG) BY MOUTH DAILY 03/09/22   Willow Ora, MD  potassium chloride SA (KLOR-CON M) 20 MEQ tablet Take 1 tablet (20 mEq total) by mouth daily. 03/14/22   Willow Ora, MD  traZODone (DESYREL) 50 MG tablet Take 1 tablet  (50 mg total) by mouth at bedtime as needed for sleep. 02/06/23   Glenford Bayley, NP  varenicline (CHANTIX) 1 MG tablet TAKE 1 TABLET BY MOUTH TWICE DAILY 04/14/22   Willow Ora, MD  VENTOLIN HFA 108 [phone] Base) MCG/ACT inhaler INHALE 2 PUFFS INTO THE LUNGS EVERY 4 HOURS AS NEEDED FOR WHEEZING OR SHORTNESS OF BREATH 11/29/22   Willow Ora, MD      Allergies    Patient has no known allergies.    Review of Systems   Review of Systems  Cardiovascular:  Positive for chest pain.  All other systems reviewed and are negative.   Physical Exam Updated Vital Signs BP 134/76   Pulse 64   Temp 98 F (36.7 C) (Oral)   Resp 16   Ht 5\' 3"  (1.6 m)   Wt (!) 142.9 kg   LMP 12/14/2021   SpO2 97%   BMI 55.80 kg/m  Physical Exam Vitals and nursing note reviewed.  Constitutional:      Appearance: She is well-developed.  HENT:     Head: Normocephalic.  Cardiovascular:     Rate and Rhythm: Normal rate and regular rhythm.     Heart sounds: Normal heart sounds.  Pulmonary:     Effort: Pulmonary effort is normal.  Abdominal:     General: Bowel sounds are normal. There is no distension.  Palpations: Abdomen is soft.  Musculoskeletal:        General: Normal range of motion.     Cervical back: Normal range of motion.  Skin:    General: Skin is warm.  Neurological:     General: No focal deficit present.     Mental Status: She is alert and oriented to person, place, and time.     ED Results / Procedures / Treatments   Labs (all labs ordered are listed, but only abnormal results are displayed) Labs Reviewed  COMPREHENSIVE METABOLIC PANEL WITH GFR - Abnormal; Notable for the following components:      Result Value   BUN 5 (*)    Albumin 3.4 (*)    All other components within normal limits  CBC WITH DIFFERENTIAL/PLATELET - Abnormal; Notable for the following components:   WBC 12.4 (*)    Platelets 425 (*)    Neutro Abs 8.4 (*)    All other components within normal limits   LIPASE, BLOOD  D-DIMER, QUANTITATIVE  CBC WITH DIFFERENTIAL/PLATELET  TROPONIN I (HIGH SENSITIVITY)  TROPONIN I (HIGH SENSITIVITY)    EKG EKG Interpretation Date/Time:  Friday October 18 2023 17:23:43 EDT Ventricular Rate:  73 PR Interval:  144 QRS Duration:  108 QT Interval:  415 QTC Calculation: 458 R Axis:   26  Text Interpretation: Sinus rhythm Low voltage, precordial leads Consider anterior infarct Artifact in lead(s) I II III aVR aVL Confirmed by Alona Bene [phone]) on 10/18/2023 5:34:08 PM  Radiology DG Chest Port 1 View Result Date: 10/18/2023 CLINICAL DATA:  Chest pain. EXAM: PORTABLE CHEST 1 VIEW COMPARISON:  May 07, 2020. FINDINGS: Stable cardiomediastinal silhouette. Both lungs are clear. The visualized skeletal structures are unremarkable. IMPRESSION: No active disease. Electronically Signed   By: Lupita Raider M.D.   On: 10/18/2023 18:48    Procedures Procedures    Medications Ordered in ED Medications - No data to display  ED Course/ Medical Decision Making/ A&P                                 Medical Decision Making Patient reports she feels like her heart is missing beats.  Patient reports that she has a history of the same.  Amount and/or Complexity of Data Reviewed Labs: ordered. Decision-making details documented in ED Course.    Details: Labs ordered reviewed and interpreted troponin is negative Radiology: ordered.    Details: Chest x-ray shows no acute changes ECG/medicine tests: ordered and independent interpretation performed. Decision-making details documented in ED Course.    Details: EKG normal sinus normal EKG  Risk Risk Details: I discussed monitoring with the patient.  She has had some PVCs which I think may be what she is feeling.  Patient is advised to follow-up with her physician for recheck.  Patient is advised to avoid stimulants such as caffeine.  Patient is advised to return to the emergency department if any  problems.           Final Clinical Impression(s) / ED Diagnoses Final diagnoses:  Nonspecific chest pain  PVC's (premature ventricular contractions)    Rx / DC Orders ED Discharge Orders     None      An After Visit Summary was printed and given to the patient.    Elson Areas, [place] 10/18/23 2324    Maia Plan, MD 10/21/23 [phone]

## 2023-10-18 NOTE — ED Triage Notes (Signed)
 Pt c/o cp that is moving down her left arm. States that "my heart stops beating for no reason"

## 2023-10-28 ENCOUNTER — Encounter (HOSPITAL_COMMUNITY): Payer: Self-pay

## 2023-10-28 ENCOUNTER — Ambulatory Visit (HOSPITAL_COMMUNITY): Admission: EM | Admit: 2023-10-28 | Discharge: 2023-10-28 | Disposition: A | Discharge status: Home / Self Care

## 2023-10-28 ENCOUNTER — Ambulatory Visit (INDEPENDENT_AMBULATORY_CARE_PROVIDER_SITE_OTHER)

## 2023-10-28 DIAGNOSIS — S3992XA Unspecified injury of lower back, initial encounter: Secondary | ICD-10-CM

## 2023-10-28 DIAGNOSIS — M5442 Lumbago with sciatica, left side: Secondary | ICD-10-CM

## 2023-10-28 DIAGNOSIS — W19XXXA Unspecified fall, initial encounter: Secondary | ICD-10-CM

## 2023-10-28 DIAGNOSIS — M5441 Lumbago with sciatica, right side: Secondary | ICD-10-CM

## 2023-10-28 DIAGNOSIS — M545 Low back pain, unspecified: Secondary | ICD-10-CM | POA: Diagnosis not present

## 2023-10-28 MED ORDER — BACLOFEN 20 MG PO TABS: 20.0000 mg | ORAL_TABLET | Freq: Three times a day (TID) | ORAL | 0 refills | Status: DC

## 2023-10-28 MED ORDER — DICLOFENAC SODIUM 75 MG PO TBEC: 75.0000 mg | DELAYED_RELEASE_TABLET | Freq: Two times a day (BID) | ORAL | 0 refills | Status: DC

## 2023-10-28 MED ORDER — KETOROLAC TROMETHAMINE 60 MG/2ML IM SOLN
60.0000 mg | Freq: Once | INTRAMUSCULAR | Status: AC
Start: 2023-10-28 — End: 2023-10-28
  Administered 2023-10-28: 60 mg via INTRAMUSCULAR

## 2023-10-28 MED ORDER — KETOROLAC TROMETHAMINE 60 MG/2ML IM SOLN
INTRAMUSCULAR | Status: AC
  Filled 2023-10-28: qty 2

## 2023-10-28 NOTE — ED Provider Notes (Signed)
 [facility] [place]  Note:  This document was prepared using Dragon voice recognition software and may include unintentional dictation errors.  MRN: [id number] DOB: 1986-07-01  Subjective:   Deborah Andrews is a 38 y.o. female presenting for midline lower back pain, tailbone pain, bruising to left lower leg, bilateral lower extremity numbness and tingling following a fall down a flight of stairs earlier today.  Patient reports that she stepped on a broken step causing her to fall, patient reports that she rolled down 15 stairs.  Denies hitting head or any loss of consciousness.  Patient reports that worst pain is in her lower back and coccyx.  Patient has not taken any over-the-counter medication to treat symptoms.  Patient admits to having history of lower back pain with a pinched nerve and sciatica.  Denies any previous tailbone fractures or lower leg injuries.  No current facility-administered medications for this encounter.  Current Outpatient Medications:    baclofen (LIORESAL) 20 MG tablet, Take 1 tablet (20 mg total) by mouth 3 (three) times daily., Disp: 30 each, Rfl: 0   diclofenac (VOLTAREN) 75 MG EC tablet, Take 1 tablet (75 mg total) by mouth 2 (two) times daily., Disp: 30 tablet, Rfl: 0   furosemide (LASIX) 20 MG tablet, Take 1 tablet (20 mg total) by mouth daily as needed for edema. Take with potassium, Disp: 30 tablet, Rfl: 3   loratadine (CLARITIN) 10 MG tablet, Take 10 mg by mouth daily. Pt takes over the counter Aller-itin for allergies., Disp: , Rfl:    Moringa Oleifera (MORINGA PO), Take 1,000 mg by mouth daily. Moringa an over the counter multi vitamin supplement., Disp: , Rfl:    Multiple Vitamin (MULTIVITAMIN) capsule, Take 1 capsule by mouth daily., Disp: , Rfl:    omeprazole (PRILOSEC) 20 MG capsule, TAKE 1 CAPSULE(20 MG) BY MOUTH DAILY, Disp: 90 capsule, Rfl: 3   potassium chloride SA (KLOR-CON M) 20 MEQ tablet, Take 1 tablet (20 mEq total) by mouth daily., Disp:  30 tablet, Rfl: 3   traZODone (DESYREL) 50 MG tablet, Take 1 tablet (50 mg total) by mouth at bedtime as needed for sleep., Disp: 30 tablet, Rfl: 2   VENTOLIN HFA 108 (90 Base) MCG/ACT inhaler, INHALE 2 PUFFS INTO THE LUNGS EVERY 4 HOURS AS NEEDED FOR WHEEZING OR SHORTNESS OF BREATH, Disp: 18 g, Rfl: 1   No Known Allergies  Past Medical History:  Diagnosis Date   ADHD (attention deficit hyperactivity disorder)    Allergy    Anxiety    Apnea    Arthritis    Asthma    Atypical chest pain    Complication of anesthesia    Wakes up angry and mean   Depression    Dysplasia of cervix, low grade (CIN 1) 11/09/2020   abnl pap 09/2020; colpo 04/2021 cin 1; repeat pap in 1 year per Dr. 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 Valtrex prn outbreaks   Menorrhagia with regular cycle 11/09/2020   Normal endometrial biopsy 04/2021, Dr. Donavan Foil, gyn   Sleep apnea    done in [place] per pt, does not wear CPAP     Past Surgical History:  Procedure Laterality Date   CERVICAL CONE BIOPSY     CESAREAN SECTION     x2   CHOLECYSTECTOMY     ENDOMETRIAL BIOPSY  TONSILLECTOMY     TUBAL LIGATION     VAGINAL HYSTERECTOMY Bilateral 01/02/2022   Procedure: HYSTERECTOMY VAGINAL WITH SALPINGECTOMY;  Surgeon: Warden Fillers, MD;  Location: [facility];  Service: Gynecology;  Laterality: Bilateral;   WISDOM TOOTH EXTRACTION      Family History  Problem Relation Age of Onset   Diabetes Mother    Hypertension Mother    Breast cancer Mother        dx before age 17   Cancer Mother 67       Gyn. ?cervical ?ovarian    Cancer - Other Mother 64       unknown primary. ?kidney   Diabetes Father    Skin cancer Maternal Aunt        x3   Breast cancer Maternal Uncle 81       female breast cancer   Melanoma Maternal Grandmother    Melanoma Maternal Grandfather    Cancer  Maternal Grandfather        unknown type   Leukemia Half-Brother 2       maternal half brother   Cervical cancer Half-Sister 1       maternal half sister    Social History   Tobacco Use   Smoking status: Former    Current packs/day: 0.00    Average packs/day: 4.0 packs/day for 22.0 years (88.0 ttl pk-yrs)    Types: Cigarettes    Quit date: 10/2022    Years since quitting: 1.0   Smokeless tobacco: Never   Tobacco comments:    Currently smoking 1ppd as of 03/30/22 ep  Vaping Use   Vaping status: Never Used  Substance Use Topics   Alcohol use: Yes    Comment: occ- on holidays   Drug use: Never    ROS Refer to HPI for ROS details.  Objective:   Vitals: BP 119/83 (BP Location: Left Arm)   Pulse 78   Temp 98.2 F (36.8 C) (Oral)   Resp 16   LMP 12/14/2021   SpO2 95%   Physical Exam Vitals and nursing note reviewed.  Constitutional:      General: She is not in acute distress.    Appearance: Normal appearance. She is well-developed. She is not ill-appearing or toxic-appearing.  HENT:     Head: Normocephalic and atraumatic.  Cardiovascular:     Rate and Rhythm: Normal rate.  Pulmonary:     Effort: Pulmonary effort is normal. No respiratory distress.  Musculoskeletal:     Lumbar back: Spasms, tenderness and bony tenderness present. No swelling or deformity. Decreased range of motion.     Right lower leg: Tenderness present. No swelling or bony tenderness. No edema.     Left lower leg: Tenderness present. No swelling or bony tenderness. No edema.  Skin:    General: Skin is warm and dry.     Capillary Refill: Capillary refill takes less than 2 seconds.  Neurological:     General: No focal deficit present.     Mental Status: She is alert and oriented to person, place, and time.  Psychiatric:        Mood and Affect: Mood normal.     Procedures  No results found for this or any previous visit (from the past 24 hours).  Assessment and Plan :   PDMP not  reviewed this encounter.  1. Fall, initial encounter   2. Acute midline low back pain with bilateral sciatica   3. Injury of coccyx, initial encounter  1. Acute midline low back pain with bilateral sciatica - DG Lumbar Spine Complete performed in UC shows no acute fracture, mild to moderate degenerative changes noted. - baclofen (LIORESAL) 20 MG tablet; Take 1 tablet (20 mg total) by mouth 3 (three) times daily.   2. Injury of coccyx, initial encounter - DG Sacrum/Coccyx no obvious fracture noted.  Will continue to wait for final radiologist read if there is any abnormal finding patient will be contacted and appropriate treatment provided. - diclofenac (VOLTAREN) 75 MG EC tablet; Take 1 tablet (75 mg total) by mouth 2 (two) times daily.  3. Fall, initial encounter (Primary) - ketorolac (TORADOL) injection 60 mg given for acute pain secondary to fall in urgent care -Continue to monitor symptoms for any change in severity if there is any escalation of current symptoms or development of new symptoms follow-up in ER for further evaluation and management.  Lucky Cowboy   [place], [place], [place] 10/28/23 1157

## 2023-10-28 NOTE — ED Triage Notes (Signed)
 Patient reports that she stepped on a broken step today and bounced on her lower body and rolled down 15 steps. Patient denies hitting her head.  Patient c/o coccyx, lower extremity and lower back pain.  Patient denies taking any medication for her symptoms.

## 2023-10-28 NOTE — Discharge Instructions (Addendum)
  1. Acute midline low back pain with bilateral sciatica - DG Lumbar Spine Complete performed in UC shows no acute fracture, mild to moderate degenerative changes noted. - baclofen (LIORESAL) 20 MG tablet; Take 1 tablet (20 mg total) by mouth 3 (three) times daily.   2. Injury of coccyx, initial encounter - DG Sacrum/Coccyx no obvious fracture noted.  Will continue to wait for final radiologist read if there is any abnormal finding patient will be contacted and appropriate treatment provided. - diclofenac (VOLTAREN) 75 MG EC tablet; Take 1 tablet (75 mg total) by mouth 2 (two) times daily.  3. Fall, initial encounter (Primary) - ketorolac (TORADOL) injection 60 mg given for acute pain secondary to fall in urgent care

## 2023-11-14 ENCOUNTER — Encounter: Payer: Medicaid Other | Admitting: Family Medicine

## 2023-11-15 ENCOUNTER — Telehealth: Payer: Self-pay

## 2023-11-15 ENCOUNTER — Encounter: Payer: Self-pay | Admitting: Family Medicine

## 2023-11-15 ENCOUNTER — Ambulatory Visit (INDEPENDENT_AMBULATORY_CARE_PROVIDER_SITE_OTHER): Admitting: Family Medicine

## 2023-11-15 VITALS — BP 110/78 | HR 83 | Temp 98.2°F | Ht 63.0 in | Wt 308.4 lb

## 2023-11-15 DIAGNOSIS — D72829 Elevated white blood cell count, unspecified: Secondary | ICD-10-CM | POA: Diagnosis not present

## 2023-11-15 DIAGNOSIS — Z23 Encounter for immunization: Secondary | ICD-10-CM | POA: Diagnosis not present

## 2023-11-15 DIAGNOSIS — Z87891 Personal history of nicotine dependence: Secondary | ICD-10-CM

## 2023-11-15 DIAGNOSIS — J4520 Mild intermittent asthma, uncomplicated: Secondary | ICD-10-CM

## 2023-11-15 DIAGNOSIS — Z0001 Encounter for general adult medical examination with abnormal findings: Secondary | ICD-10-CM

## 2023-11-15 DIAGNOSIS — Z9189 Other specified personal risk factors, not elsewhere classified: Secondary | ICD-10-CM

## 2023-11-15 DIAGNOSIS — F3342 Major depressive disorder, recurrent, in full remission: Secondary | ICD-10-CM

## 2023-11-15 DIAGNOSIS — R609 Edema, unspecified: Secondary | ICD-10-CM | POA: Diagnosis not present

## 2023-11-15 MED ORDER — TRAZODONE HCL 50 MG PO TABS: 50.0000 mg | ORAL_TABLET | Freq: Every evening | ORAL | 3 refills | Status: AC | PRN

## 2023-11-15 MED ORDER — ZEPBOUND 2.5 MG/0.5ML ~~LOC~~ SOAJ: 2.5000 mg | SUBCUTANEOUS | 2 refills | Status: DC

## 2023-11-15 MED ORDER — OMEPRAZOLE 20 MG PO CPDR: 20.0000 mg | DELAYED_RELEASE_CAPSULE | Freq: Every day | ORAL | 3 refills | Status: AC

## 2023-11-15 MED ORDER — FUROSEMIDE 20 MG PO TABS: 20.0000 mg | ORAL_TABLET | Freq: Every day | ORAL | 3 refills | Status: AC | PRN

## 2023-11-15 MED ORDER — POTASSIUM CHLORIDE CRYS ER 20 MEQ PO TBCR
20.0000 meq | EXTENDED_RELEASE_TABLET | Freq: Every day | ORAL | 3 refills | Status: DC | PRN
Start: 2023-11-15 — End: 2024-01-14

## 2023-11-15 MED ORDER — ALBUTEROL SULFATE HFA 108 (90 BASE) MCG/ACT IN AERS: 2.0000 | INHALATION_SPRAY | RESPIRATORY_TRACT | 5 refills | Status: AC | PRN

## 2023-11-15 NOTE — Telephone Encounter (Signed)
 Copied from CRM [id number]. Topic: Clinical - Prescription Issue >> Nov 15, 2023 11:53 AM Ovid Blow wrote: Reason for CRM: [facility] pharmacy told patient that medication tirzepatide (ZEPBOUND) 2.5 MG/0.5ML Pen is not covered by her insurance. Patient wants something that will be covered by insurance. >> Nov 15, 2023 12:21 PM Varney Gentleman wrote: Patient and [employer] and patient are calling stating patient needs a prior authorization sent in for the tirzepatide (ZEPBOUND) 2.5 MG/0.5ML Pen, can be faxed over [phone] fax.  Bambi Lever [employer] [phone] fax.  I message pt earlier to let her know that she would have to get in contact with her carrier to see what they will cover and let us  know or asked them does the rx need a PA

## 2023-11-15 NOTE — Progress Notes (Signed)
 Subjective  Chief Complaint  Patient presents with   Annual Exam    Pt here for Annual Exam and is not currently fasting    Obesity    HPI: Deborah Andrews is a 38 y.o. female who presents to [facility] [place] today for a Female Wellness Visit.  She also has the concerns and/or needs as listed above in the chief complaint. These will be addressed in addition to the Health Maintenance Visit.   Wellness Visit: annual visit with health maintenance review and exam HM: due mammogram in may. Reviewed last pap last year: normal; s/p partial hysterectomy for CIN 1 and bleeding. No further screens indicated. Remains cigarette free x 1 year now! Successfully losing weight.  Now single again; had a difficult transition out of a relationship but doing better now. Working 2 jobs; single Restaurant manager, fast food.   Chronic disease management visit and/or acute problem visit: Discussed the use of AI scribe software for clinical note transcription with the patient, who gave verbal consent to proceed.  Asthma: needs ventolin . Out of meds due to hard year last year. But now back on medicaid. No recent flares. Remains cigarette free Depression is active. Has failed multiple ssris but has done well on wellbutrin in the past Some edema, at times. Would like lasix  refilled.  Reviewed gyn notes.  Reviewed recent er notes. Atypical cp.  Would like to lose weight. We have discussed options in the past. She is eating a healthier diet and trying to walk for exercise. She has failed phentermine  in the past. Discussed glp-1. Good candidate.  Assessment  1. Encounter for well adult exam with abnormal findings   2. At high risk for breast cancer   3. Former smoker   4. Depression, major, recurrent, in complete remission (HCC)   5. Morbid obesity due to excess calories (HCC)   6. Leukocytosis, unspecified type   7. Mild intermittent asthma without complication   8. Dependent edema   9. Need for pneumococcal  20-valent conjugate vaccination      Plan  Female Wellness Visit: Age appropriate Health Maintenance and Prevention measures were discussed with patient. Included topics are cancer screening recommendations, ways to keep healthy (see AVS) including dietary and exercise recommendations, regular eye and dental care, use of seat belts, and avoidance of moderate alcohol use and tobacco use. Pap current BMI: discussed patient's BMI and encouraged positive lifestyle modifications to help get to or maintain a target BMI. HM needs and immunizations were addressed and ordered. See below for orders. See HM and immunization section for updates. Prevnar 20 given today. Routine labs and screening tests ordered including cmp, cbc and lipids where appropriate. Discussed recommendations regarding Vit D and calcium supplementation (see AVS)  Chronic disease f/u and/or acute problem visit: (deemed necessary to be done in addition to the wellness visit): Assessment and Plan Assessment & Plan Obesity Significant weight loss of 30 pounds. Current weight 308 pounds. Efforts include cessation of soda and smoking. Exercise. Failed phentermine  - Start Zepbound 2.5 mg weekly for four weeks. If tolerated, increase to 5 mg weekly. - Follow up in three months for weight and mood check.  Depression Depression monitored with weight management. - Follow up in three months. - Consider adding Wellbutrin if needed. - Recommend counseling.  Chronic Insomnia Previously managed with trazodone , effective. - Restart trazodone .  Asthma Well controlled with no recent exacerbations. - Restart Ventolin  as needed. - Continue allergy medications. - Administered Prevnar 20 today  for asthma indication.    Follow up: 3 mo for weight check  Orders Placed This Encounter  Procedures   Pneumococcal conjugate vaccine 20-valent (Prevnar 20)   Meds ordered this encounter  Medications   tirzepatide (ZEPBOUND) 2.5 MG/0.5ML Pen     Sig: Inject 2.5 mg into the skin once a week.    Dispense:  2 mL    Refill:  2   furosemide  (LASIX ) 20 MG tablet    Sig: Take 1 tablet (20 mg total) by mouth daily as needed for edema. Take with potassium    Dispense:  30 tablet    Refill:  3   omeprazole  (PRILOSEC) 20 MG capsule    Sig: Take 1 capsule (20 mg total) by mouth daily.    Dispense:  90 capsule    Refill:  3   potassium chloride  SA (KLOR-CON  M) 20 MEQ tablet    Sig: Take 1 tablet (20 mEq total) by mouth daily as needed.    Dispense:  30 tablet    Refill:  3    Only take with furosemide    traZODone  (DESYREL ) 50 MG tablet    Sig: Take 1 tablet (50 mg total) by mouth at bedtime as needed for sleep.    Dispense:  90 tablet    Refill:  3   albuterol  (VENTOLIN  HFA) 108 (90 Base) MCG/ACT inhaler    Sig: Inhale 2 puffs into the lungs every 4 (four) hours as needed for wheezing or shortness of breath.    Dispense:  18 g    Refill:  5       Body mass index is 54.63 kg/m. Wt Readings from Last 3 Encounters:  11/15/23 (!) 308 lb 6.4 oz (139.9 kg)  10/18/23 (!) 315 lb (142.9 kg)  02/06/23 (!) 339 lb 3.2 oz (153.9 kg)     Patient Active Problem List   Diagnosis Date Noted Date Diagnosed   Precordial pain 11/08/2022     Priority: High    Cardiology evaluation: Normal coronary calcium score of 0 with normal coronaries.  Normal echocardiogram.  Trial of Imdur  for vasospasm.    Snoring 03/30/2022     Priority: High    Neg sleep study 11/2022: neg for apnea, mild desaturation, neg periodic limb mvts pulm Dr. Gaynell Keeler    At high risk for breast cancer 08/04/2020     Priority: High    Lifetime risk by Tyrick -cruscick 20.8%: consider annual breast MRI in conjunction with mammo Ordered mammo 10/2022    Former smoker 06/07/2020     Priority: High    Quit mid April 2024    Morbid obesity due to excess calories (HCC) 06/07/2020     Priority: High   Depression, major, recurrent, in complete remission (HCC) 06/07/2020      Priority: High    Long history; has been on multiple SSRIs in the past. Denies bipolar disorder    GERD (gastroesophageal reflux disease) 02/11/2023     Priority: Medium    Leukocytosis 11/08/2022     Priority: Medium     10/2022 WBC 15.2; has had elevated WBCs 2023 due to postoperatie vaginal cuff cellulitis. Has had rare mild elevation in past. ? Reactive due to smoking. Will recheck, may warrant hematology evaluation.    Dependent edema 04/20/2022     Priority: Medium    Syncope 2022 08/03/2021     Priority: Medium     Nl echocardiogram, labs and 14 day holter (rare pvc's with one 10 beat  run of SVT): offered BB prn    Mild intermittent asthma without complication 06/07/2020     Priority: Medium     PFT 05/2022: normal except mild decrease in diffusion capacity. Dr. Denna Fish.     Left lower lobe pulmonary nodule 11/08/2022     Priority: Low    Chest CT; calcified; likely granuloma. 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 Valtrex  prn outbreaks    Genetic testing 07/27/2020     Priority: Low    Negative hereditary cancer genetic testing: no pathogenic variants detected in [facility] Multi-Cancer +RNA Panel.  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  The report date is July 26, 2020.   The Multi-Cancer + RNA Panel offered by [facility] includes sequencing and/or deletion/duplication analysis of the following 84 genes:  AIP*, ALK, APC*, ATM*, AXIN2*, BAP1*, BARD1*, BLM*, BMPR1A*, BRCA1*, BRCA2*, BRIP1*, CASR, CDC73*, CDH1*, CDK4, CDKN1B*, CDKN1C*, CDKN2A, CEBPA, CHEK2*, CTNNA1*, DICER1*, DIS3L2*, EGFR, EPCAM, FH*, FLCN*, GATA2*, GPC3, GREM1, HOXB13, HRAS, KIT, MAX*, MEN1*, MET, MITF, MLH1*, MSH2*, MSH3*, MSH6*, MUTYH*, NBN*, NF1*, NF2*, NTHL1*, PALB2*, PDGFRA, PHOX2B, PMS2*, POLD1*, POLE*, POT1*, PRKAR1A*, PTCH1*, PTEN*, RAD50*, RAD51C*, RAD51D*,  RB1*, RECQL4, RET, RUNX1*, SDHA*, SDHAF2*, SDHB*, SDHC*, SDHD*, SMAD4*, SMARCA4*, SMARCB1*, SMARCE1*, STK11*, SUFU*, TERC, TERT, TMEM127*, Tp53*, TSC1*, TSC2*, VHL*, WRN*, and WT1.  RNA analysis is performed for * genes.     Health Maintenance  Topic Date Due   MAMMOGRAM  11/22/2023   Cervical Cancer Screening (HPV/Pap Cotest)  02/03/2026   DTaP/Tdap/Td (2 - Td or Tdap) 02/20/2026   Pneumococcal Vaccine 89-37 Years old  Completed   Hepatitis C Screening  Completed   HIV Screening  Completed   HPV VACCINES  Aged Out   Meningococcal B Vaccine  Aged Out   INFLUENZA VACCINE  Discontinued   COVID-19 Vaccine  Discontinued   Immunization History  Administered Date(s) Administered   PNEUMOCOCCAL CONJUGATE-20 11/15/2023   Tdap 02/21/2016   We updated and reviewed the patient's past history in detail and it is documented below. Allergies: Patient  reports current alcohol use. 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 Past Surgical History Patient  has a past surgical history that includes Cesarean section; Cholecystectomy; Tonsillectomy; Wisdom tooth extraction; Tubal ligation; Cervical cone biopsy; Endometrial biopsy; and Vaginal hysterectomy (Bilateral, 01/02/2022). Social History   Socioeconomic History   Marital status: Significant Other    Spouse name: boyfriend   Number of children: Not on file   Years of education: Not on file   Highest education level: 12th grade  Occupational History   Not on file  Tobacco Use   Smoking status: Former    Current packs/day: 0.00    Average packs/day: 4.0 packs/day for 22.0 years  (88.0 ttl pk-yrs)    Types: Cigarettes    Quit date: 10/2022    Years since quitting: 1.0   Smokeless tobacco: Never   Tobacco comments:    Currently smoking 1ppd as of 03/30/22 ep  Vaping Use   Vaping status: Never Used  Substance and Sexual Activity   Alcohol use: Yes  Comment: occ- on holidays   Drug use: Never   Sexual activity: Yes    Birth control/protection: Surgical  Other Topics Concern   Not on file  Social History Narrative   Not on file   Social Drivers of Health   Financial Resource Strain: Medium Risk (02/04/2023)   Overall Financial Resource Strain (CARDIA)    Difficulty of Paying Living Expenses: Somewhat hard  Food Insecurity: Food Insecurity Present (02/04/2023)   Hunger Vital Sign    Worried About Running Out of Food in the Last Year: Often true    Ran Out of Food in the Last Year: Often true  Transportation Needs: No Transportation Needs (02/04/2023)   PRAPARE - Administrator, Civil Service (Medical): No    Lack of Transportation (Non-Medical): No  Physical Activity: Unknown (02/04/2023)   Exercise Vital Sign    Days of Exercise per Week: 0 days    Minutes of Exercise per Session: Not on file  Stress: Stress Concern Present (02/04/2023)   [employer] of Occupational Health - Occupational Stress Questionnaire    Feeling of Stress : Very much  Social Connections: Unknown (02/21/2023)   Received from [facility]   Social Network    Social Network: Not on file  Recent Concern: Social Connections - Moderately Isolated (02/04/2023)   Social Connection and Isolation Panel [NHANES]    Frequency of Communication with Friends and Family: More than three times a week    Frequency of Social Gatherings with Friends and Family: Once a week    Attends Religious Services: 1 to 4 times per year    Active Member of [employer] or Organizations: No    Attends Engineer, structural: Not on file    Marital Status: Divorced   Family History  Problem  Relation Age of Onset   Diabetes Mother    Hypertension Mother    Breast cancer Mother        dx before age 92   Cancer Mother 24       Gyn. ?cervical ?ovarian    Cancer - Other Mother 66       unknown primary. ?kidney   Diabetes Father    Skin cancer Maternal Aunt        x3   Breast cancer Maternal Uncle 30       female breast cancer   Melanoma Maternal Grandmother    Melanoma Maternal Grandfather    Cancer Maternal Grandfather        unknown type   Leukemia Half-Brother 2       maternal half brother   Cervical cancer Half-Sister 27       maternal half sister    Review of Systems: Constitutional: negative for fever or malaise Ophthalmic: negative for photophobia, double vision or loss of vision Cardiovascular: negative for chest pain, dyspnea on exertion, or new LE swelling Respiratory: negative for SOB or persistent cough Gastrointestinal: negative for abdominal pain, change in bowel habits or melena Genitourinary: negative for dysuria or gross hematuria, no abnormal uterine bleeding or disharge Musculoskeletal: negative for new gait disturbance or muscular weakness Integumentary: negative for new or persistent rashes, no breast lumps Neurological: negative for TIA or stroke symptoms Psychiatric: negative for SI or delusions Allergic/Immunologic: negative for hives  Patient Care Team    Relationship Specialty Notifications Start End  Luevenia Saha, MD PCP - General Family Medicine  02/21/20   Thukkani, Arun K, MD PCP - Cardiology Cardiology  07/30/22  Abigail Abler, MD Consulting Physician Obstetrics and Gynecology  07/12/21   Kyra Phy, MD Consulting Physician Cardiology  10/16/21   Margaretann Sharper, MD Consulting Physician Pulmonary Disease  08/20/22   Ulysees Gander, DO Consulting Physician Sports Medicine  12/04/22     Objective  Vitals: BP 110/78   Pulse 83   Temp 98.2 F (36.8 C)   Ht 5\' 3"  (1.6 m)   Wt (!) 308 lb 6.4 oz (139.9 kg)   LMP  12/14/2021   SpO2 97%   BMI 54.63 kg/m  General:  Well developed, well nourished, no acute distress  Psych:  Alert and orientedx3,normal mood and affect HEENT:  Normocephalic, atraumatic, non-icteric sclera, PERRL, supple neck without adenopathy, mass or thyromegaly Cardiovascular:  Normal S1, S2, RRR without gallop, rub or murmur Respiratory:  Good breath sounds bilaterally, CTAB with normal respiratory effort Gastrointestinal: normal bowel sounds, soft, non-tender, no noted masses. No HSM MSK: no deformities, contusions. Joints are without erythema or swelling.  Skin:  Warm, no rashes or suspicious lesions noted Neurologic:    Mental status is normal. Gross motor and sensory exams are normal. Normal gait. No tremor    Commons side effects, risks, benefits, and alternatives for medications and treatment plan prescribed today were discussed, and the patient expressed understanding of the given instructions. Patient is instructed to call or message via MyChart if he/she has any questions or concerns regarding our treatment plan. No barriers to understanding were identified. We discussed Red Flag symptoms and signs in detail. Patient expressed understanding regarding what to do in case of urgent or emergency type symptoms.  Medication list was reconciled, printed and provided to the patient in AVS. Patient instructions and summary information was reviewed with the patient as documented in the AVS. This note was prepared with assistance of Dragon voice recognition software. Occasional wrong-word or sound-a-like substitutions may have occurred due to the inherent limitations of voice recognition software .

## 2023-11-15 NOTE — Telephone Encounter (Signed)
 Copied from CRM [id number]. Topic: Clinical - Prescription Issue >> Nov 15, 2023 11:53 AM Ovid Blow wrote: Reason for CRM: [facility] pharmacy told patient that medication tirzepatide (ZEPBOUND) 2.5 MG/0.5ML Pen is not covered by her insurance. Patient wants something that will be covered by insurance.

## 2023-11-15 NOTE — Patient Instructions (Addendum)
 Please return in 3 months for weight and mood recheck.   If you have any questions or concerns, please don't hesitate to send me a message via MyChart or call the office at [phone]. Thank you for visiting with us  today! It's our pleasure caring for you.    VISIT SUMMARY: During your visit, we discussed your recent weight loss, medication management, and concerns about your heart health. You have made significant lifestyle changes, including quitting smoking and reducing sugary sodas, which have contributed to your weight loss. We also addressed your depression, chronic insomnia, premature ventricular contractions (PVCs), and asthma management.  YOUR PLAN: -OBESITY: Obesity is a condition characterized by excessive body fat. You have lost 30 pounds since your last visit. We will start you on Zepbound 2.5 mg weekly for four weeks, and if tolerated, increase to 5 mg weekly. Please follow up in three months for a weight and mood check.  -DEPRESSION: Depression is a mood disorder that causes persistent feelings of sadness and loss of interest. We will monitor your depression alongside your weight management. Please follow up in three months. We may consider adding Wellbutrin if needed and recommend counseling.  -CHRONIC INSOMNIA: Chronic insomnia is a condition where you have trouble falling or staying asleep. We will restart trazodone , which you previously found effective.  -ASTHMA: Asthma is a condition in which your airways narrow and swell and may produce extra mucus. Your asthma has been well controlled with no recent exacerbations. We will restart Ventolin  as needed and continue your allergy medications. You received the Prevnar 20 vaccine today for asthma indication.  INSOMNIA: restart your wellbutrin.   INSTRUCTIONS: Please follow up in three months for a weight and mood check. If you experience any new or worsening symptoms, contact our office  immediately.                      Contains text generated by Abridge.                                 Contains text generated by Abridge.

## 2023-11-18 ENCOUNTER — Telehealth: Payer: Self-pay

## 2023-11-18 ENCOUNTER — Other Ambulatory Visit (HOSPITAL_COMMUNITY): Payer: Self-pay

## 2023-11-18 MED ORDER — WEGOVY 0.25 MG/0.5ML ~~LOC~~ SOAJ: 0.2500 mg | SUBCUTANEOUS | 0 refills | Status: DC

## 2023-11-18 NOTE — Telephone Encounter (Signed)
 Changed to wegovy. Please submit PA

## 2023-11-18 NOTE — Telephone Encounter (Signed)
 Pharmacy Patient Advocate Encounter   Received notification from Pt Calls Messages that prior authorization for Zepbound 2.5mg /0.64ml is required/requested.   Insurance verification completed.   The patient is insured through [facility] MEDICAID .   Frederik Jansky is the preferred product for the medicaid plans and seeing if the patient could be changed to [facility] and a PA submitted.   Please advise

## 2023-11-18 NOTE — Telephone Encounter (Signed)
 Copied from CRM [id number]. Topic: Referral - Prior Authorization Question >> Nov 18, 2023 10:35 AM Juluis Ok wrote: Reason for CRM: [employer] provider, [employer], calling to request a prior authorization for tirzepatide (ZEPBOUND) 2.5 MG/0.5ML Pen. Callback # [phone]  Please start PA for the above medication

## 2023-11-19 ENCOUNTER — Telehealth: Payer: Self-pay

## 2023-11-19 ENCOUNTER — Other Ambulatory Visit (HOSPITAL_COMMUNITY): Payer: Self-pay

## 2023-11-19 NOTE — Telephone Encounter (Signed)
 Pharmacy Patient Advocate Encounter   Received notification from CoverMyMeds that prior authorization for [facility] 0.25MG /0.5ML auto-injectors is required/requested.   Insurance verification completed.   The patient is insured through [facility] .   Per test claim: PA required; PA submitted to above mentioned insurance via CoverMyMeds Key/confirmation #/EOC [id number] Status is pending

## 2023-11-19 NOTE — Telephone Encounter (Signed)
 Pharmacy Patient Advocate Encounter  Received notification from [facility] MEDICAID that Prior Authorization for [facility] 0.25mg /0.32ml has been APPROVED from 11/19/23 to 05/20/24   PA #/Case ID/Reference #: [id number]

## 2023-11-20 NOTE — Telephone Encounter (Signed)
 PA has been approved, pt and clinic aware.

## 2023-11-29 ENCOUNTER — Encounter: Payer: Self-pay | Admitting: Family Medicine

## 2023-11-29 ENCOUNTER — Ambulatory Visit (INDEPENDENT_AMBULATORY_CARE_PROVIDER_SITE_OTHER): Admitting: Family Medicine

## 2023-11-29 VITALS — BP 104/66 | HR 76 | Temp 98.4°F | Ht 63.0 in | Wt 310.4 lb

## 2023-11-29 DIAGNOSIS — M79671 Pain in right foot: Secondary | ICD-10-CM

## 2023-11-29 DIAGNOSIS — K08109 Complete loss of teeth, unspecified cause, unspecified class: Secondary | ICD-10-CM

## 2023-11-29 DIAGNOSIS — M79672 Pain in left foot: Secondary | ICD-10-CM | POA: Diagnosis not present

## 2023-11-29 NOTE — Patient Instructions (Signed)
 Please follow up if symptoms do not improve or as needed.     VISIT SUMMARY: Today, you were seen for issues related to your dentures and foot pain. We discussed the discomfort caused by your ill-fitting dentures and the severe pain in your foot and leg. Additionally, we reviewed your medication and its effects on your appetite.  YOUR PLAN: -FOOT PAIN: You have severe pain in your foot and leg that is not typical of common conditions like plantar fasciitis. We are referring you to a podiatrist, a foot specialist, for further evaluation and treatment.   INSTRUCTIONS: Please schedule an appointment with the podiatrist for your foot pain evaluation.                      Contains text generated by Abridge.                                 Contains text generated by Abridge.

## 2023-11-29 NOTE — Progress Notes (Signed)
 Subjective  CC:  Chief Complaint  Patient presents with   Leg Pain   Letter for teeth    Pt stated that she needs a letter to get some new teeth bc the teeth that she has does not fit her. Stated that she was given the wrong teeth. Also needs a referral to a podiatrist bc she has some burning in her feet anf they hurt     HPI: Deborah Andrews is a 38 y.o. female who presents to the office today to address the problems listed above in the chief complaint. Discussed the use of AI scribe software for clinical note transcription with the patient, who gave verbal consent to proceed.  History of Present Illness Deborah Andrews is a 38 year old female who presents with issues related to ill-fitting dentures and foot pain.  She received dentures that do not fit properly, causing significant discomfort. The dentures feel like 'needles' in her mouth, with no space to open her mouth properly. The bottom dentures cannot be worn because they broke a bottom tooth, and the top dentures are too large, falling out easily. The dentures appear to be made of metal, which Medicaid does not cover.  She experiences foot pain affecting the entire foot and leg, with particular sensitivity to touch and increased pain with movement. The pain has been persistent and impacts her mobility.   She has been taking wegovy .  Initially, she experienced nausea and had to force herself to eat, focusing on protein intake. However, after several days, she began feeling very hungry, which is unusual for her as she typically forgets to eat. She recently redosed and is currently experiencing increased hunger without nausea.   Assessment  1. Pain in both feet   2. Edentulous      Plan  Assessment and Plan Assessment & Plan Foot pain Severe bilateral foot pain. Perhaps orthotics could help. - Refer to podiatrist for further evaluation.  Ill fitting dentures: note given to patient to help her get reevaluated per medicaid  guidelines  Morbid obesity: education given. Continue wegovy .     Follow up: prn Orders Placed This Encounter  Procedures   Ambulatory referral to Podiatry   No orders of the defined types were placed in this encounter.    I reviewed the patients updated PMH, FH, and SocHx.  Patient Active Problem List   Diagnosis Date Noted   Precordial pain 11/08/2022    Priority: High   Snoring 03/30/2022    Priority: High   At high risk for breast cancer 08/04/2020    Priority: High   Former smoker 06/07/2020    Priority: High   Morbid obesity due to excess calories (HCC) 06/07/2020    Priority: High   Depression, major, recurrent, in complete remission (HCC) 06/07/2020    Priority: High   GERD (gastroesophageal reflux disease) 02/11/2023    Priority: Medium    Leukocytosis 11/08/2022    Priority: Medium    Dependent edema 04/20/2022    Priority: Medium    Syncope 2022 08/03/2021    Priority: Medium    Mild intermittent asthma without complication 06/07/2020    Priority: Medium    Left lower lobe pulmonary nodule 11/08/2022    Priority: Low   S/P vaginal hysterectomy 01/02/2022    Priority: Low   Herpes simplex 12/05/2020    Priority: Low   Genetic testing 07/27/2020    Priority: Low   Current Meds  Medication Sig   albuterol  (VENTOLIN   HFA) 108 (90 Base) MCG/ACT inhaler Inhale 2 puffs into the lungs every 4 (four) hours as needed for wheezing or shortness of breath.   baclofen  (LIORESAL ) 20 MG tablet Take 1 tablet (20 mg total) by mouth 3 (three) times daily.   diclofenac  (VOLTAREN ) 75 MG EC tablet Take 1 tablet (75 mg total) by mouth 2 (two) times daily.   furosemide  (LASIX ) 20 MG tablet Take 1 tablet (20 mg total) by mouth daily as needed for edema. Take with potassium   loratadine (CLARITIN) 10 MG tablet Take 10 mg by mouth daily. Pt takes over the counter Aller-itin for allergies.   Moringa Oleifera (MORINGA PO) Take 1,000 mg by mouth daily. Moringa an over the counter  multi vitamin supplement.   Multiple Vitamin (MULTIVITAMIN) capsule Take 1 capsule by mouth daily.   omeprazole  (PRILOSEC) 20 MG capsule Take 1 capsule (20 mg total) by mouth daily.   potassium chloride  SA (KLOR-CON  M) 20 MEQ tablet Take 1 tablet (20 mEq total) by mouth daily as needed.   traZODone  (DESYREL ) 50 MG tablet Take 1 tablet (50 mg total) by mouth at bedtime as needed for sleep.   WEGOVY  0.25 MG/0.5ML SOAJ Inject 0.25 mg into the skin once a week.   Allergies: Patient has no known allergies. Family History: Patient family history includes Breast cancer in her mother; Breast cancer (age of onset: 41) in her maternal uncle; Cancer in her maternal grandfather; Cancer (age of onset: 75) in her mother; Cancer - Other (age of onset: 2) in her mother; Cervical cancer (age of onset: 34) in her half-sister; Diabetes in her father and mother; Hypertension in her mother; Leukemia (age of onset: 2) in her half-brother; Melanoma in her maternal grandfather and maternal grandmother; Skin cancer in her maternal aunt. Social History:  Patient  reports that she quit smoking about 13 months ago. Her smoking use included cigarettes. She has a 88 pack-year smoking history. She has never used smokeless tobacco. She reports current alcohol use. She reports that she does not use drugs.  Review of Systems: Constitutional: Negative for fever malaise or anorexia Cardiovascular: negative for chest pain Respiratory: negative for SOB or persistent cough Gastrointestinal: negative for abdominal pain  Objective  Vitals: BP 104/66   Pulse 76   Temp 98.4 F (36.9 C)   Ht 5\' 3"  (1.6 m)   Wt (!) 310 lb 6.4 oz (140.8 kg)   LMP 12/14/2021   SpO2 97%   BMI 54.98 kg/m  General: no acute distress , A&Ox3 Normal appearing feet.  Diffuse ttp. No callus, sores, swollen joints Commons side effects, risks, benefits, and alternatives for medications and treatment plan prescribed today were discussed, and the patient  expressed understanding of the given instructions. Patient is instructed to call or message via MyChart if he/she has any questions or concerns regarding our treatment plan. No barriers to understanding were identified. We discussed Red Flag symptoms and signs in detail. Patient expressed understanding regarding what to do in case of urgent or emergency type symptoms.  Medication list was reconciled, printed and provided to the patient in AVS. Patient instructions and summary information was reviewed with the patient as documented in the AVS. This note was prepared with assistance of Dragon voice recognition software. Occasional wrong-word or sound-a-like substitutions may have occurred due to the inherent limitations of voice recognition software

## 2023-12-11 ENCOUNTER — Ambulatory Visit (INDEPENDENT_AMBULATORY_CARE_PROVIDER_SITE_OTHER): Admitting: Podiatry

## 2023-12-11 ENCOUNTER — Other Ambulatory Visit: Payer: Self-pay | Admitting: Family Medicine

## 2023-12-11 ENCOUNTER — Encounter: Payer: Self-pay | Admitting: Podiatry

## 2023-12-11 ENCOUNTER — Ambulatory Visit (INDEPENDENT_AMBULATORY_CARE_PROVIDER_SITE_OTHER)

## 2023-12-11 VITALS — Ht 63.0 in | Wt 310.0 lb

## 2023-12-11 DIAGNOSIS — M7752 Other enthesopathy of left foot: Secondary | ICD-10-CM

## 2023-12-11 DIAGNOSIS — M722 Plantar fascial fibromatosis: Secondary | ICD-10-CM | POA: Diagnosis not present

## 2023-12-11 DIAGNOSIS — M7751 Other enthesopathy of right foot: Secondary | ICD-10-CM

## 2023-12-11 DIAGNOSIS — Z0271 Encounter for disability determination: Secondary | ICD-10-CM

## 2023-12-11 MED ORDER — MELOXICAM 15 MG PO TABS: 15.0000 mg | ORAL_TABLET | Freq: Every day | ORAL | 2 refills | Status: DC

## 2023-12-11 MED ORDER — WEGOVY 0.25 MG/0.5ML ~~LOC~~ SOAJ: 0.2500 mg | SUBCUTANEOUS | 0 refills | Status: DC

## 2023-12-11 MED ORDER — TRIAMCINOLONE ACETONIDE 10 MG/ML IJ SUSP
10.0000 mg | Freq: Once | INTRAMUSCULAR | Status: AC
  Administered 2023-12-11: 10 mg via INTRA_ARTICULAR

## 2023-12-11 NOTE — Telephone Encounter (Signed)
 Copied from CRM [id number]. Topic: Clinical - Medication Refill >> Dec 11, 2023  9:28 AM Kita Perish H wrote: Medication: ALPRAZolam  (XANAX ) 0.5 MG tablet (Shows Discontinued) WEGOVY  0.25 MG/0.5ML SOAJ  Has the patient contacted their pharmacy? Yes, no refills on Wegovy  (Agent: If no, request that the patient contact the pharmacy for the refill. If patient does not wish to contact the pharmacy document the reason why and proceed with request.) (Agent: If yes, when and what did the pharmacy advise?)  This is the patient's preferred pharmacy:  [id number] - [place],  - [address] [place] [address] Phone: [phone] Fax: [phone]  Is this the correct pharmacy for this prescription? Yes If no, delete pharmacy and type the correct one.   Has the prescription been filled recently? No  Is the patient out of the medication? Yes  Has the patient been seen for an appointment in the last year OR does the patient have an upcoming appointment? Yes  Can we respond through MyChart? Yes  Agent: Please be advised that Rx refills may take up to 3 business days. We ask that you follow-up with your pharmacy.

## 2023-12-11 NOTE — Telephone Encounter (Signed)
 Recd [place] form. Faxed to [phone] form/note with restriction must be seated while working with a chair.

## 2023-12-11 NOTE — Telephone Encounter (Signed)
 Also asking for Xanax  0.5 mg, no longer on med list.

## 2023-12-12 ENCOUNTER — Encounter: Payer: Self-pay | Admitting: Podiatry

## 2023-12-12 ENCOUNTER — Telehealth: Payer: Self-pay

## 2023-12-12 ENCOUNTER — Other Ambulatory Visit (HOSPITAL_COMMUNITY): Payer: Self-pay

## 2023-12-12 ENCOUNTER — Telehealth: Payer: Self-pay | Admitting: Podiatry

## 2023-12-12 NOTE — Telephone Encounter (Signed)
 Patient came into the office today. Her employer is requesting a time frame for how long she is able to stand during her work shift. She did have to come back in for another work note stating that she would need to be sitting permanently, but she has come back for second time due to her employer wanting to know how long she can stand for her shifts. If you can let me know or make a new Doctors note for the patient specifying a time frame for how long she can stand at work or to take periodic breaks during the shift.

## 2023-12-12 NOTE — Telephone Encounter (Signed)
 Pharmacy Patient Advocate Encounter   Received notification from Patient Pharmacy that prior authorization for Wegovy  is required/requested.   Insurance verification completed.   The patient is insured through [employer] .   Per test claim: Refill too soon. PA is not needed at this time. Medication was filled 12/12/23. Next eligible fill date is 01/02/24. Medication already has prior authorization approval. See encounter 11/19/23

## 2023-12-12 NOTE — Progress Notes (Signed)
 Subjective:   Patient ID: Deborah Andrews, female   DOB: 38 y.o.   MRN: [id number]   HPI Patient presents with a lot of pain in the heels of both feet and also at times in the forefoot of both feet.  States that they get very sore and hard to walk on and she does have obesity which is complicating factor trying to lose weight currently.  Patient does not smoke and is not active   Review of Systems  All other systems reviewed and are negative.       Objective:  Physical Exam Vitals and nursing note reviewed.  Constitutional:      Appearance: She is well-developed.  Pulmonary:     Effort: Pulmonary effort is normal.  Musculoskeletal:        General: Normal range of motion.  Skin:    General: Skin is warm.  Neurological:     Mental Status: She is alert.     Neurovascular status intact muscle strength adequate range of motion adequate with exquisite discomfort noted medial fascial band of both feet at the insertion of the tendon into the calcaneus.  Mild forefoot pain which is probably compensatory around the lesser MPJs with fluid buildup and again moderate flatfoot with obesity is complicating factor.  Good digital perfusion well-oriented acute plan     Assessment:  *Fasciitis bilateral which is probably contributory to other pains also agent     Plan:  PT x-rays reviewed discussed sterile prep injected the plantar fascia at insertion 3 mg Kenalog 5 mg Xylocaine  and advised on support shoes  X-rays do indicate spurs in the heel region bilateral flatfoot deformity bilateral

## 2023-12-12 NOTE — Telephone Encounter (Signed)
 Recd [place] form again. I refaxed again with note [phone]. See prev note from Dr.-based on how the pt is feeling* I included in the update

## 2024-01-02 ENCOUNTER — Ambulatory Visit: Admitting: Podiatry

## 2024-01-09 ENCOUNTER — Other Ambulatory Visit: Payer: Self-pay | Admitting: Family Medicine

## 2024-01-09 DIAGNOSIS — Z1231 Encounter for screening mammogram for malignant neoplasm of breast: Secondary | ICD-10-CM

## 2024-01-14 ENCOUNTER — Encounter (HOSPITAL_COMMUNITY): Payer: Self-pay

## 2024-01-14 ENCOUNTER — Ambulatory Visit (HOSPITAL_COMMUNITY)
Admission: EM | Admit: 2024-01-14 | Discharge: 2024-01-14 | Disposition: A | Discharge status: Home / Self Care | Attending: Family Medicine | Admitting: Family Medicine

## 2024-01-14 DIAGNOSIS — R310 Gross hematuria: Secondary | ICD-10-CM | POA: Diagnosis not present

## 2024-01-14 LAB — CBC WITH DIFFERENTIAL/PLATELET
Abs Immature Granulocytes: 0.05 10*3/uL (ref 0.00–0.07)
Basophils Absolute: 0.1 10*3/uL (ref 0.0–0.1)
Basophils Relative: 1 %
Eosinophils Absolute: 0.1 10*3/uL (ref 0.0–0.5)
Eosinophils Relative: 1 %
HCT: 41.8 % (ref 36.0–46.0)
Hemoglobin: 13.8 g/dL (ref 12.0–15.0)
Immature Granulocytes: 0 %
Lymphocytes Relative: 24 %
Lymphs Abs: 2.7 10*3/uL (ref 0.7–4.0)
MCH: 30.8 pg (ref 26.0–34.0)
MCHC: 33 g/dL (ref 30.0–36.0)
MCV: 93.3 fL (ref 80.0–100.0)
Monocytes Absolute: 0.5 10*3/uL (ref 0.1–1.0)
Monocytes Relative: 5 %
Neutro Abs: 7.8 10*3/uL — ABNORMAL HIGH (ref 1.7–7.7)
Neutrophils Relative %: 69 %
Platelets: 402 10*3/uL — ABNORMAL HIGH (ref 150–400)
RBC: 4.48 MIL/uL (ref 3.87–5.11)
RDW: 13.7 % (ref 11.5–15.5)
WBC: 11.2 10*3/uL — ABNORMAL HIGH (ref 4.0–10.5)
nRBC: 0 % (ref 0.0–0.2)

## 2024-01-14 LAB — POCT URINALYSIS DIP (MANUAL ENTRY)
Bilirubin, UA: NEGATIVE
Glucose, UA: NEGATIVE mg/dL
Ketones, POC UA: NEGATIVE mg/dL
Leukocytes, UA: NEGATIVE
Nitrite, UA: NEGATIVE
Protein Ur, POC: 100 mg/dL — AB
Spec Grav, UA: >=1.03 — AB (ref 1.010–1.025)
Urobilinogen, UA: 1 U/dL
pH, UA: 7 (ref 5.0–8.0)

## 2024-01-14 LAB — COMPREHENSIVE METABOLIC PANEL WITH GFR
ALT: 16 U/L (ref 0–44)
AST: 22 U/L (ref 15–41)
Albumin: 3.6 g/dL (ref 3.5–5.0)
Alkaline Phosphatase: 44 U/L (ref 38–126)
Anion gap: 10 (ref 5–15)
BUN: 10 mg/dL (ref 6–20)
CO2: 23 mmol/L (ref 22–32)
Calcium: 9.1 mg/dL (ref 8.9–10.3)
Chloride: 104 mmol/L (ref 98–111)
Creatinine, Ser: 0.98 mg/dL (ref 0.44–1.00)
GFR, Estimated: >60 mL/min (ref >60)
Glucose, Bld: 73 mg/dL (ref 70–99)
Potassium: 3.9 mmol/L (ref 3.5–5.1)
Sodium: 137 mmol/L (ref 135–145)
Total Bilirubin: 1 mg/dL (ref 0.0–1.2)
Total Protein: 7.1 g/dL (ref 6.5–8.1)

## 2024-01-14 LAB — POCT URINE PREGNANCY: Preg Test, Ur: NEGATIVE

## 2024-01-14 NOTE — ED Triage Notes (Signed)
 Patient reports urinary frequency and hematuria/clots that started today. Patient states she is currently taking Wegovy  and mother had kidney cancer.

## 2024-01-14 NOTE — Discharge Instructions (Signed)
You have had labs (urine culture and blood tests) sent today. We will call you with any significant abnormalities or if there is need to begin or change treatment or pursue further follow up.  You may also review your test results online through [facility]. If you do not have a MyChart account, instructions to sign up should be on your discharge paperwork.

## 2024-01-15 ENCOUNTER — Other Ambulatory Visit: Payer: Self-pay

## 2024-01-15 ENCOUNTER — Telehealth: Payer: Self-pay | Admitting: Family Medicine

## 2024-01-15 ENCOUNTER — Ambulatory Visit (HOSPITAL_COMMUNITY): Payer: Self-pay

## 2024-01-15 ENCOUNTER — Emergency Department (HOSPITAL_COMMUNITY)
Admission: EM | Admit: 2024-01-15 | Discharge: 2024-01-15 | Discharge status: Against Medical Advice | Attending: Physician Assistant | Admitting: Physician Assistant

## 2024-01-15 DIAGNOSIS — Z5321 Procedure and treatment not carried out due to patient leaving prior to being seen by health care provider: Secondary | ICD-10-CM | POA: Insufficient documentation

## 2024-01-15 DIAGNOSIS — R319 Hematuria, unspecified: Secondary | ICD-10-CM | POA: Diagnosis not present

## 2024-01-15 DIAGNOSIS — N939 Abnormal uterine and vaginal bleeding, unspecified: Secondary | ICD-10-CM | POA: Diagnosis present

## 2024-01-15 DIAGNOSIS — R310 Gross hematuria: Secondary | ICD-10-CM | POA: Diagnosis not present

## 2024-01-15 LAB — URINALYSIS, ROUTINE W REFLEX MICROSCOPIC
Bilirubin Urine: NEGATIVE
Glucose, UA: NEGATIVE mg/dL
Hgb urine dipstick: NEGATIVE
Ketones, ur: NEGATIVE mg/dL
Nitrite: NEGATIVE
Protein, ur: 30 mg/dL — AB
Specific Gravity, Urine: 1.028 (ref 1.005–1.030)
pH: 5 (ref 5.0–8.0)

## 2024-01-15 LAB — COMPREHENSIVE METABOLIC PANEL WITH GFR
ALT: 17 U/L (ref 0–44)
AST: 20 U/L (ref 15–41)
Albumin: 3.6 g/dL (ref 3.5–5.0)
Alkaline Phosphatase: 44 U/L (ref 38–126)
Anion gap: 9 (ref 5–15)
BUN: 10 mg/dL (ref 6–20)
CO2: 23 mmol/L (ref 22–32)
Calcium: 9.1 mg/dL (ref 8.9–10.3)
Chloride: 108 mmol/L (ref 98–111)
Creatinine, Ser: 0.98 mg/dL (ref 0.44–1.00)
GFR, Estimated: >60 mL/min (ref >60)
Glucose, Bld: 95 mg/dL (ref 70–99)
Potassium: 4.4 mmol/L (ref 3.5–5.1)
Sodium: 140 mmol/L (ref 135–145)
Total Bilirubin: 0.9 mg/dL (ref 0.0–1.2)
Total Protein: 7.1 g/dL (ref 6.5–8.1)

## 2024-01-15 LAB — CBC WITH DIFFERENTIAL/PLATELET
Abs Immature Granulocytes: 0.04 10*3/uL (ref 0.00–0.07)
Basophils Absolute: 0.1 10*3/uL (ref 0.0–0.1)
Basophils Relative: 1 %
Eosinophils Absolute: 0.1 10*3/uL (ref 0.0–0.5)
Eosinophils Relative: 1 %
HCT: 41.2 % (ref 36.0–46.0)
Hemoglobin: 13.4 g/dL (ref 12.0–15.0)
Immature Granulocytes: 0 %
Lymphocytes Relative: 24 %
Lymphs Abs: 2.6 10*3/uL (ref 0.7–4.0)
MCH: 30.8 pg (ref 26.0–34.0)
MCHC: 32.5 g/dL (ref 30.0–36.0)
MCV: 94.7 fL (ref 80.0–100.0)
Monocytes Absolute: 0.5 10*3/uL (ref 0.1–1.0)
Monocytes Relative: 5 %
Neutro Abs: 7.5 10*3/uL (ref 1.7–7.7)
Neutrophils Relative %: 69 %
Platelets: 393 10*3/uL (ref 150–400)
RBC: 4.35 MIL/uL (ref 3.87–5.11)
RDW: 13.5 % (ref 11.5–15.5)
WBC: 10.9 10*3/uL — ABNORMAL HIGH (ref 4.0–10.5)
nRBC: 0 % (ref 0.0–0.2)

## 2024-01-15 MED ORDER — WEGOVY 0.25 MG/0.5ML ~~LOC~~ SOAJ: 0.2500 mg | SUBCUTANEOUS | 0 refills | Status: DC

## 2024-01-15 NOTE — ED Triage Notes (Signed)
 Pt was seen at [facility] yesterday for hematuria. Pt had no UTI. Today pt was at work and while standing blood was running down pt leg. Pt had incontinent episode but states it was just blood and no urine. Denies dysuria or any other pain

## 2024-01-15 NOTE — Telephone Encounter (Signed)
 Copied from CRM [id number]. Topic: Clinical - Medication Refill >> Jan 15, 2024  2:35 PM Burnard DEL wrote: Medication:  WEGOVY  0.25 MG/0.5ML SOAJ    Has the patient contacted their pharmacy? Yes (Agent: If no, request that the patient contact the pharmacy for the refill. If patient does not wish to contact the pharmacy document the reason why and proceed with request.) (Agent: If yes, when and what did the pharmacy advise?) *Pharmacy called* This is the patient's preferred pharmacy:  [id number] - [place], [place] - [address] [place] [address] Phone: [phone] Fax: [phone]  Is this the correct pharmacy for this prescription? Yes If no, delete pharmacy and type the correct one.   Has the prescription been filled recently? Yes  Is the patient out of the medication? Yes  Has the patient been seen for an appointment in the last year OR does the patient have an upcoming appointment? Yes  Can we respond through MyChart? Yes  Agent: Please be advised that Rx refills may take up to 3 business days. We ask that you follow-up with your pharmacy.

## 2024-01-15 NOTE — Telephone Encounter (Unsigned)
 Copied from CRM [id number]. Topic: Clinical - Medication Refill >> Jan 15, 2024  9:52 AM Zenovia PARAS wrote: Medication:  WEGOVY  0.25 MG/0.5ML SOAJ    Has the patient contacted their pharmacy? Yes (Agent: If no, request that the patient contact the pharmacy for the refill. If patient does not wish to contact the pharmacy document the reason why and proceed with request.) (Agent: If yes, when and what did the pharmacy advise?)  This is the patient's preferred pharmacy:  [id number] - [place], [place] - [address] [place] [address] Phone: [phone] Fax: [phone]  Is this the correct pharmacy for this prescription? Yes If no, delete pharmacy and type the correct one.   Has the prescription been filled recently? No  Is the patient out of the medication? Yes  Has the patient been seen for an appointment in the last year OR does the patient have an upcoming appointment? Yes  Can we respond through MyChart? Yes  Agent: Please be advised that Rx refills may take up to 3 business days. We ask that you follow-up with your pharmacy.

## 2024-01-15 NOTE — ED Provider Notes (Signed)
 [facility]    ASSESSMENT & PLAN:  1. Gross hematuria    Unclear etiology. Distant smoking history. Referred to urology at pt request. Orders Placed This Encounter  Procedures   Ambulatory referral to Urology    Referral Priority:   Urgent    Referral Type:   Consultation    Referral Reason:   Specialty Services Required    Referred to Provider:   Alvaro Ricardo KATHEE Mickey., MD    Requested Specialty:   Urology    Number of Visits Requested:   1   No signs of nephrolithiasis. Urine culture sent.  Ensure proper hydration.  Pending: Orders Placed This Encounter  Procedures   Urine Culture   CBC with Differential/Platelet   Comprehensive metabolic panel with GFR   Outlined signs and symptoms indicating need for more acute intervention. Patient verbalized understanding. After Visit Summary given.  SUBJECTIVE:  Deborah Andrews is a 38 y.o. female who reports urinary frequency and hematuria/clots; abrupt onset; today. Mild urinary hesitancy when trying to urinate. Patient states she is currently taking Wegovy  and mother had kidney cancer. LMP: Patient's last menstrual period was 12/14/2021.  OBJECTIVE:  Vitals:   01/14/24 1358  BP: 133/78  Pulse: 71  Resp: 16  Temp: 98.4 F (36.9 C)  TempSrc: Oral  SpO2: 98%   General appearance: alert; no distress Lungs: unlabored respirations Abdomen: benign Back: no CVA tenderness reported Extremities: no edema; symmetrical with no gross deformities Skin: warm and dry Neurologic: normal gait Psychological: alert and cooperative; normal mood and affect   No Known Allergies  Past Medical History:  Diagnosis Date   ADHD (attention deficit hyperactivity disorder)    Allergy    Anxiety    Apnea    Arthritis    Asthma    Atypical chest pain    Complication of anesthesia    Wakes up angry and mean   Depression    Dysplasia of cervix, low grade (CIN 1) 11/09/2020   abnl pap 09/2020; colpo 04/2021 cin 1; repeat  pap in 1 year per Dr. 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 Valtrex  prn outbreaks   Menorrhagia with regular cycle 11/09/2020   Normal endometrial biopsy 04/2021, Dr. Zina, gyn   Sleep apnea    done in [place]  per pt, does not wear CPAP   Social History   Socioeconomic History   Marital status: Significant Other    Spouse name: boyfriend   Number of children: Not on file   Years of education: Not on file   Highest education level: 12th grade  Occupational History   Not on file  Tobacco Use   Smoking status: Former    Current packs/day: 0.00    Average packs/day: 4.0 packs/day for 22.0 years (88.0 ttl pk-yrs)    Types: Cigarettes    Quit date: 10/2022    Years since quitting: 1.2   Smokeless tobacco: Never   Tobacco comments:    Currently smoking 1ppd as of 03/30/22 ep  Vaping Use   Vaping status: Never Used  Substance and Sexual Activity   Alcohol use: Yes    Comment: occ- on holidays   Drug use: Never   Sexual activity: Yes    Birth control/protection: Surgical  Other Topics Concern   Not on file  Social History Narrative   Not  on file   Social Drivers of Health   Financial Resource Strain: Medium Risk (02/04/2023)   Overall Financial Resource Strain (CARDIA)    Difficulty of Paying Living Expenses: Somewhat hard  Food Insecurity: Food Insecurity Present (02/04/2023)   Hunger Vital Sign    Worried About Running Out of Food in the Last Year: Often true    Ran Out of Food in the Last Year: Often true  Transportation Needs: No Transportation Needs (02/04/2023)   PRAPARE - Administrator, Civil Service (Medical): No    Lack of Transportation (Non-Medical): No  Physical Activity: Unknown (02/04/2023)   Exercise Vital Sign    Days of Exercise per Week: 0 days    Minutes of Exercise per Session: Not on file  Stress:  Stress Concern Present (02/04/2023)   [employer] of Occupational Health - Occupational Stress Questionnaire    Feeling of Stress : Very much  Social Connections: Unknown (02/21/2023)   Received from [facility]   Social Network    Social Network: Not on file  Recent Concern: Social Connections - Moderately Isolated (02/04/2023)   Social Connection and Isolation Panel    Frequency of Communication with Friends and Family: More than three times a week    Frequency of Social Gatherings with Friends and Family: Once a week    Attends Religious Services: 1 to 4 times per year    Active Member of [employer] or Organizations: No    Attends Engineer, structural: Not on file    Marital Status: Divorced  Intimate Partner Violence: Unknown (02/21/2023)   Received from [facility]   HITS    Physically Hurt: Not on file    Insult or Talk Down To: Not on file    Threaten Physical Harm: Not on file    Scream or Curse: Not on file   Family History  Problem Relation Age of Onset   Diabetes Mother    Hypertension Mother    Breast cancer Mother        dx before age 40   Cancer Mother 4       Gyn. ?cervical ?ovarian    Cancer - Other Mother 24       unknown primary. ?kidney   Diabetes Father    Skin cancer Maternal Aunt        x3   Breast cancer Maternal Uncle 46       female breast cancer   Melanoma Maternal Grandmother    Melanoma Maternal Grandfather    Cancer Maternal Grandfather        unknown type   Leukemia Half-Brother 2       maternal half brother   Cervical cancer Half-Sister 85       maternal half sister        Rolinda Rogue, MD 01/15/24 1419

## 2024-01-15 NOTE — ED Notes (Signed)
 Pt decided to leave without being seen by dr. Sanda she has work in the morning.

## 2024-01-15 NOTE — ED Provider Triage Note (Signed)
 Emergency Medicine Provider Triage Evaluation Note  Deborah Andrews , a 38 y.o. female  was evaluated in triage.  Pt complains of blood running down her leg today.  Prior history of hysterectomy, saw her doctor today who did a pelvic exam, reports there was no blood seen in her urine.  She dates her PCP wanted to do a CT, however this needed to be scheduled therefore she came to the ED in order to have this done.  She does not have any urinary symptoms at this time.  Review of Systems  Positive: Hematuria, vaginal bleeding Negative: Abdominal pain, nausea, vomiting  Physical Exam  BP 112/77   Pulse 84   Temp 98.7 F (37.1 C)   Resp 16   Ht 5' 3 (1.6 m)   Wt (!) 139.7 kg   LMP 12/14/2021   SpO2 98%   BMI 54.56 kg/m  Gen:   Awake, no distress   Resp:  Normal effort  MSK:   Moves extremities without difficulty  Other:  Abdomen soft without any tenderness  Medical Decision Making  Medically screening exam initiated at 2:15 PM.  Appropriate orders placed.  [name] was informed that the remainder of the evaluation will be completed by another provider, this initial triage assessment does not replace that evaluation, and the importance of remaining in the ED until their evaluation is complete.     Kaamil Morefield, PA-C 01/15/24 1419

## 2024-01-16 LAB — URINE CULTURE: Culture: 30000 — AB

## 2024-01-17 LAB — URINE CULTURE

## 2024-01-20 ENCOUNTER — Encounter: Payer: Self-pay | Admitting: Family Medicine

## 2024-01-20 ENCOUNTER — Ambulatory Visit (INDEPENDENT_AMBULATORY_CARE_PROVIDER_SITE_OTHER): Admitting: Family Medicine

## 2024-01-20 VITALS — BP 102/74 | HR 71 | Temp 98.8°F | Ht 63.0 in | Wt 300.0 lb

## 2024-01-20 DIAGNOSIS — N3001 Acute cystitis with hematuria: Secondary | ICD-10-CM

## 2024-01-20 NOTE — Progress Notes (Signed)
 Subjective  CC:  Chief Complaint  Patient presents with   Hematuria    Pt stated that she went to Urgent care and was Dx with Deborah Andrews and wanted to know if she is contagious and she can not hold her bladder    HPI: Deborah Andrews is a 38 y.o. female who presents to the office today to address the problems listed above in the chief complaint. Discussed the use of AI scribe software for clinical note transcription with the patient, who gave verbal consent to proceed.  History of Present Illness Deborah Andrews is a 38 year old female who presents with urinary symptoms and recent bleeding.  She has experienced significant hematuria, prompting visits to urgent care and the emergency room. An initial urine test showed 30,000 colonies of E. coli, but a subsequent test in the emergency room was negative for infection. She is currently on a seven-day course of antibiotics, though she is unsure of the specific medication. The bleeding has stopped, and she is not experiencing any burning or pain during urination. She finds this unusual as she typically experiences pain or discomfort with a urinary tract infection, but this time there was none, only blood in the urine. I reviewed UC notes and all lab studies.  She reports issues with urinary urgency and incontinence, stating difficulty holding her urine and sometimes being unable to make it to the bathroom in time. This issue has been gradually worsening over time, even before the current episode. She mentions performing Kegel exercises and has an upcoming appointment with a neurologist to address these symptoms.  She is currently taking medication for another condition but feels it may not be effective as she still experiences hunger. No current burning or pain during urination. No tenderness upon abdominal palpation.   Assessment  1. Acute hemorrhagic cystitis      Plan  Assessment and Plan Assessment & Plan Urinary Tract Infection  (UTI) Initial urine culture showed 30,000 colonies of E. coli, below typical UTI threshold. Second urine test negative. On antibiotics with cessation of bleeding and no pain, indicating likely treatment effectiveness. - Continue antibiotics as prescribed, consider stopping after 5 days if symptoms resolve.  Overactive Bladder with Urinary Incontinence Increasing urgency and incontinence predating UTI, worsening symptoms. Under neurologist care with follow-up scheduled. Symptom improvement expected post-UTI treatment. - Follow up with neurologist on July 10 for further management.  Medication Management One medication not effectively controlling hunger, necessitating possible dosage adjustment. - Evaluate and adjust medication dosage as needed.    Follow up: as scheduled No orders of the defined types were placed in this encounter.  No orders of the defined types were placed in this encounter.    I reviewed the patients updated PMH, FH, and SocHx.  Patient Active Problem List   Diagnosis Date Noted   Precordial pain 11/08/2022    Priority: High   Snoring 03/30/2022    Priority: High   At high risk for breast cancer 08/04/2020    Priority: High   Former smoker 06/07/2020    Priority: High   Morbid obesity due to excess calories (HCC) 06/07/2020    Priority: High   Depression, major, recurrent, in complete remission (HCC) 06/07/2020    Priority: High   GERD (gastroesophageal reflux disease) 02/11/2023    Priority: Medium    Leukocytosis 11/08/2022    Priority: Medium    Dependent edema 04/20/2022    Priority: Medium    Syncope 2022 08/03/2021  Priority: Medium    Mild intermittent asthma without complication 06/07/2020    Priority: Medium    Left lower lobe pulmonary nodule 11/08/2022    Priority: Low   S/P vaginal hysterectomy 01/02/2022    Priority: Low   Herpes simplex 12/05/2020    Priority: Low   Genetic testing 07/27/2020    Priority: Low   Current Meds   Medication Sig   albuterol  (VENTOLIN  HFA) 108 (90 Base) MCG/ACT inhaler Inhale 2 puffs into the lungs every 4 (four) hours as needed for wheezing or shortness of breath.   furosemide  (LASIX ) 20 MG tablet Take 1 tablet (20 mg total) by mouth daily as needed for edema. Take with potassium   loratadine (CLARITIN) 10 MG tablet Take 10 mg by mouth daily. Pt takes over the counter Aller-itin for allergies.   meloxicam  (MOBIC ) 15 MG tablet Take 1 tablet (15 mg total) by mouth daily.   Moringa Oleifera (MORINGA PO) Take 1,000 mg by mouth daily. Moringa an over the counter multi vitamin supplement.   Multiple Vitamin (MULTIVITAMIN) capsule Take 1 capsule by mouth daily.   omeprazole  (PRILOSEC) 20 MG capsule Take 1 capsule (20 mg total) by mouth daily.   traZODone  (DESYREL ) 50 MG tablet Take 1 tablet (50 mg total) by mouth at bedtime as needed for sleep.   WEGOVY  0.25 MG/0.5ML SOAJ Inject 0.25 mg into the skin once a week.   Allergies: Patient has no known allergies. Family History: Patient family history includes Breast cancer in her mother; Breast cancer (age of onset: 47) in her maternal uncle; Cancer in her maternal grandfather; Cancer (age of onset: 31) in her mother; Cancer - Other (age of onset: 81) in her mother; Cervical cancer (age of onset: 45) in her half-sister; Diabetes in her father and mother; Hypertension in her mother; Leukemia (age of onset: 2) in her half-brother; Melanoma in her maternal grandfather and maternal grandmother; Skin cancer in her maternal aunt. Social History:  Patient  reports that she quit smoking about 14 months ago. Her smoking use included cigarettes. She has a 88 pack-year smoking history. She has never used smokeless tobacco. She reports current alcohol use. She reports that she does not use drugs.  Review of Systems: Constitutional: Negative for fever malaise or anorexia Cardiovascular: negative for chest pain Respiratory: negative for SOB or persistent  cough Gastrointestinal: negative for abdominal pain  Objective  Vitals: BP 102/74   Pulse 71   Temp 98.8 F (37.1 C)   Ht 5' 3 (1.6 m)   Wt 300 lb (136.1 kg)   LMP 12/14/2021   SpO2 97%   BMI 53.14 kg/m  General: no acute distress , A&Ox3 Appears well No suprapubic ttp  Commons side effects, risks, benefits, and alternatives for medications and treatment plan prescribed today were discussed, and the patient expressed understanding of the given instructions. Patient is instructed to call or message via MyChart if he/she has any questions or concerns regarding our treatment plan. No barriers to understanding were identified. We discussed Red Flag symptoms and signs in detail. Patient expressed understanding regarding what to do in case of urgent or emergency type symptoms.  Medication list was reconciled, printed and provided to the patient in AVS. Patient instructions and summary information was reviewed with the patient as documented in the AVS. This note was prepared with assistance of Dragon voice recognition software. Occasional wrong-word or sound-a-like substitutions may have occurred due to the inherent limitations of voice recognition software

## 2024-01-21 ENCOUNTER — Ambulatory Visit
Admission: RE | Admit: 2024-01-21 | Discharge: 2024-01-21 | Disposition: A | Discharge status: Home / Self Care | Source: Ambulatory Visit | Attending: Family Medicine | Admitting: Family Medicine

## 2024-01-21 DIAGNOSIS — Z1231 Encounter for screening mammogram for malignant neoplasm of breast: Secondary | ICD-10-CM | POA: Diagnosis not present

## 2024-01-30 DIAGNOSIS — R8271 Bacteriuria: Secondary | ICD-10-CM | POA: Diagnosis not present

## 2024-01-30 DIAGNOSIS — R310 Gross hematuria: Secondary | ICD-10-CM | POA: Diagnosis not present

## 2024-02-05 ENCOUNTER — Other Ambulatory Visit: Payer: Self-pay | Admitting: Family Medicine

## 2024-02-05 MED ORDER — WEGOVY 0.25 MG/0.5ML ~~LOC~~ SOAJ: 0.5000 mg | SUBCUTANEOUS | 2 refills | Status: DC

## 2024-02-05 NOTE — Telephone Encounter (Signed)
 Copied from CRM [id number]. Topic: Clinical - Medication Refill >> Feb 05, 2024  1:39 PM Berneda F wrote: Medication: WEGOVY  0.25 MG/0.5ML SOAJ  PT stated Provider was going to increase the dosage on this, but did not state what the new dose should be. I did not see it listed in the notes as to the dosage it should be.   Has the patient contacted their pharmacy? Yes (Agent: If no, request that the patient contact the pharmacy for the refill. If patient does not wish to contact the pharmacy document the reason why and proceed with request.) (Agent: If yes, when and what did the pharmacy advise?)  This is the patient's preferred pharmacy:  [id number] - [place], [place] - [address] [place] [address] Phone: [phone] Fax: [phone]  Is this the correct pharmacy for this prescription? Yes If no, delete pharmacy and type the correct one.   Has the prescription been filled recently? No  Is the patient out of the medication? Yes  Has the patient been seen for an appointment in the last year OR does the patient have an upcoming appointment? Yes  Can we respond through MyChart? Yes  Agent: Please be advised that Rx refills may take up to 3 business days. We ask that you follow-up with your pharmacy.

## 2024-02-11 DIAGNOSIS — N3289 Other specified disorders of bladder: Secondary | ICD-10-CM | POA: Diagnosis not present

## 2024-02-11 DIAGNOSIS — R310 Gross hematuria: Secondary | ICD-10-CM | POA: Diagnosis not present

## 2024-02-11 DIAGNOSIS — K5730 Diverticulosis of large intestine without perforation or abscess without bleeding: Secondary | ICD-10-CM | POA: Diagnosis not present

## 2024-02-11 DIAGNOSIS — Z9049 Acquired absence of other specified parts of digestive tract: Secondary | ICD-10-CM | POA: Diagnosis not present

## 2024-02-14 ENCOUNTER — Ambulatory Visit: Admitting: Family

## 2024-02-17 ENCOUNTER — Encounter: Payer: Self-pay | Admitting: Family Medicine

## 2024-02-17 ENCOUNTER — Ambulatory Visit: Payer: Self-pay | Admitting: Family Medicine

## 2024-02-17 DIAGNOSIS — F339 Major depressive disorder, recurrent, unspecified: Secondary | ICD-10-CM

## 2024-02-17 MED ORDER — BUPROPION HCL ER (XL) 150 MG PO TB24: 150.0000 mg | ORAL_TABLET | Freq: Every day | ORAL | 3 refills | Status: DC

## 2024-02-17 MED ORDER — WEGOVY 0.5 MG/0.5ML ~~LOC~~ SOAJ
0.5000 mg | SUBCUTANEOUS | 2 refills | Status: DC
Start: 2024-02-17 — End: 2024-05-19

## 2024-02-17 NOTE — Progress Notes (Signed)
 Subjective  CC:  Chief Complaint  Patient presents with   Obesity   Depression    HPI: Deborah Andrews is a 38 y.o. female who presents to the office today to address the problems listed above in the chief complaint. F/u obesity: on wegovy  0.25, 2nd month. Has had almost 10 pound weight loss. Has gone done in jeans size. Feeling well overall. Some diarrhea and sulfur burps. But tolerable. Walking for exercise. Diet is improved.  Mood: + phq9 and endorses sxs of depression. Has failed multiple ssri's in past but reports responsive to wellbutrin . Would like to restart medication. + home stressors. No SI   Assessment  1. Morbid obesity due to excess calories (HCC)   2. Major depression, recurrent, chronic (HCC)      Plan  obesity:   improving on wegovy : titrate up to 0.5mg  x 2 months. Continue diet and exercise, high protein Depression is active: restart wellbutrin  xl 150 daily.  Follow up: 3 mo for recheck Visit date not found  No orders of the defined types were placed in this encounter.  Meds ordered this encounter  Medications   buPROPion  (WELLBUTRIN  XL) 150 MG 24 hr tablet    Sig: Take 1 tablet (150 mg total) by mouth daily.    Dispense:  90 tablet    Refill:  3   WEGOVY  0.5 MG/0.5ML SOAJ    Sig: Inject 0.5 mg into the skin once a week.    Dispense:  2 mL    Refill:  2      I reviewed the patients updated PMH, FH, and SocHx.    Patient Active Problem List   Diagnosis Date Noted   Precordial pain 11/08/2022    Priority: High   Snoring 03/30/2022    Priority: High   At high risk for breast cancer 08/04/2020    Priority: High   Former smoker 06/07/2020    Priority: High   Morbid obesity due to excess calories (HCC) 06/07/2020    Priority: High   Depression, major, recurrent, in complete remission (HCC) 06/07/2020    Priority: High   GERD (gastroesophageal reflux disease) 02/11/2023    Priority: Medium    Leukocytosis 11/08/2022    Priority: Medium     Dependent edema 04/20/2022    Priority: Medium    Syncope 2022 08/03/2021    Priority: Medium    Mild intermittent asthma without complication 06/07/2020    Priority: Medium    Left lower lobe pulmonary nodule 11/08/2022    Priority: Low   S/P vaginal hysterectomy 01/02/2022    Priority: Low   Herpes simplex 12/05/2020    Priority: Low   Genetic testing 07/27/2020    Priority: Low   Current Meds  Medication Sig   albuterol  (VENTOLIN  HFA) 108 (90 Base) MCG/ACT inhaler Inhale 2 puffs into the lungs every 4 (four) hours as needed for wheezing or shortness of breath.   buPROPion  (WELLBUTRIN  XL) 150 MG 24 hr tablet Take 1 tablet (150 mg total) by mouth daily.   furosemide  (LASIX ) 20 MG tablet Take 1 tablet (20 mg total) by mouth daily as needed for edema. Take with potassium   loratadine (CLARITIN) 10 MG tablet Take 10 mg by mouth daily. Pt takes over the counter Aller-itin for allergies.   meloxicam  (MOBIC ) 15 MG tablet Take 1 tablet (15 mg total) by mouth daily.   Moringa Oleifera (MORINGA PO) Take 1,000 mg by mouth daily. Moringa an over the counter multi vitamin supplement.  Multiple Vitamin (MULTIVITAMIN) capsule Take 1 capsule by mouth daily.   omeprazole  (PRILOSEC) 20 MG capsule Take 1 capsule (20 mg total) by mouth daily.   traZODone  (DESYREL ) 50 MG tablet Take 1 tablet (50 mg total) by mouth at bedtime as needed for sleep.   WEGOVY  0.5 MG/0.5ML SOAJ Inject 0.5 mg into the skin once a week.   [DISCONTINUED] WEGOVY  0.25 MG/0.5ML SOAJ Inject 0.5 mg into the skin once a week.    Allergies: Patient has no known allergies. Family History: Patient family history includes Breast cancer in her cousin, cousin, cousin, maternal aunt, maternal aunt, maternal aunt, and maternal aunt; Breast cancer (age of onset: 3) in her mother; Breast cancer (age of onset: 27) in her maternal uncle; Cancer in her maternal grandfather; Cancer (age of onset: 69) in her mother; Cancer - Other (age of onset:  81) in her mother; Cervical cancer (age of onset: 80) in her half-sister; Diabetes in her father and mother; Hypertension in her mother; Leukemia (age of onset: 2) in her half-brother; Melanoma in her maternal grandfather and maternal grandmother; Skin cancer in her maternal aunt. Social History:  Patient  reports that she quit smoking about 15 months ago. Her smoking use included cigarettes. She has a 88 pack-year smoking history. She has never used smokeless tobacco. She reports current alcohol use. She reports that she does not use drugs.  Review of Systems: Constitutional: Negative for fever malaise or anorexia Cardiovascular: negative for chest pain Respiratory: negative for SOB or persistent cough Gastrointestinal: negative for abdominal pain  Objective  Vitals: BP 120/86   Pulse 85   Temp 98.1 F (36.7 C)   Ht 5' 3 (1.6 m)   Wt 297 lb (134.7 kg)   LMP 12/14/2021   SpO2 97%   BMI 52.61 kg/m  General: no acute distress , A&Ox3   Commons side effects, risks, benefits, and alternatives for medications and treatment plan prescribed today were discussed, and the patient expressed understanding of the given instructions. Patient is instructed to call or message via MyChart if he/she has any questions or concerns regarding our treatment plan. No barriers to understanding were identified. We discussed Red Flag symptoms and signs in detail. Patient expressed understanding regarding what to do in case of urgent or emergency type symptoms.  Medication list was reconciled, printed and provided to the patient in AVS. Patient instructions and summary information was reviewed with the patient as documented in the AVS. This note was prepared with assistance of Dragon voice recognition software. Occasional wrong-word or sound-a-like substitutions may have occurred due to the inherent limitations of voice recognition software

## 2024-02-17 NOTE — Patient Instructions (Signed)
 Please return in 3 months for recheck weight and mood  If you have any questions or concerns, please don't hesitate to send me a message via MyChart or call the office at [phone]. Thank you for visiting with us  today! It's our pleasure caring for you.

## 2024-03-03 DIAGNOSIS — R3915 Urgency of urination: Secondary | ICD-10-CM | POA: Diagnosis not present

## 2024-03-03 DIAGNOSIS — N3946 Mixed incontinence: Secondary | ICD-10-CM | POA: Diagnosis not present

## 2024-03-03 DIAGNOSIS — R310 Gross hematuria: Secondary | ICD-10-CM | POA: Diagnosis not present

## 2024-03-11 ENCOUNTER — Encounter: Payer: Self-pay | Admitting: Family

## 2024-03-11 ENCOUNTER — Ambulatory Visit (INDEPENDENT_AMBULATORY_CARE_PROVIDER_SITE_OTHER): Admitting: Family

## 2024-03-11 VITALS — BP 119/73 | HR 66 | Temp 97.7°F | Ht 63.0 in | Wt 300.0 lb

## 2024-03-11 DIAGNOSIS — J029 Acute pharyngitis, unspecified: Secondary | ICD-10-CM

## 2024-03-11 DIAGNOSIS — L299 Pruritus, unspecified: Secondary | ICD-10-CM | POA: Diagnosis not present

## 2024-03-11 LAB — POCT RAPID STREP A (OFFICE): Rapid Strep A Screen: NEGATIVE

## 2024-03-11 LAB — POC COVID19 BINAXNOW: SARS Coronavirus 2 Ag: NEGATIVE

## 2024-03-11 MED ORDER — HYDROCORTISONE-ACETIC ACID 1-2 % OT SOLN: 3.0000 [drp] | Freq: Two times a day (BID) | OTIC | 0 refills | Status: AC

## 2024-03-11 NOTE — Progress Notes (Signed)
 Patient ID: Deborah Andrews, female    DOB: 1986-03-14, 38 y.o.   MRN: [id number]  Chief Complaint  Patient presents with   Sore Throat   loss of smell  Discussed the use of AI scribe software for clinical note transcription with the patient, who gave verbal consent to proceed.  History of Present Illness Deborah Andrews is a 38 year old female who presents with a sore throat and itchy ear.  Pharyngitis symptoms - Sore throat present upon waking - Sore throat attributed to drainage rather than congestion - No fever - Concern for potential COVID-19 exposure at work due to multiple colleagues testing positive  Nasal and oropharyngeal drainage - Uses Benadryl  at night and Claritin in the morning - Medications alleviate drainage but not congestion  Otalgia and pruritus - Persistent internal itch in the right ear - Describes a 'squishy' sound when scratching the ear - Sensation of water retention in the right ear after showering  Assessment & Plan Viral rhinitis Symptoms consistent with rhinitis. Antihistamines provided some relief. No congestion noted. Rapid covid test negative. - Continue Claritin in the morning and Benadryl  at night as needed. - Consider Flonase  or Nasacort  nasal spray if congestion develops. - Advise on cautious use of OTC generic Sudafed if needed.  Itching of ear canal Persistent itching in right ear likely due to water retention or allergy. No external redness or skin irritation noted. - Prescribe Acetic acid  2% eardrops for itching 3 drops 2 times daily prn. - Call back if sx are persisting.  Acute pharyngitis Sore throat likely due to post-nasal drip from allergic rhinitis, not bacterial infection. Rapid strep test negative. - Recommend ibuprofen  up to 600mg  tid prn for throat pain relief, to be taken for a few days. - Warm salt water gargles prn - Call back if sx are persisting.   Subjective:    Outpatient Medications Prior to Visit  Medication  Sig Dispense Refill   albuterol  (VENTOLIN  HFA) 108 (90 Base) MCG/ACT inhaler Inhale 2 puffs into the lungs every 4 (four) hours as needed for wheezing or shortness of breath. 18 g 5   buPROPion  (WELLBUTRIN  XL) 150 MG 24 hr tablet Take 1 tablet (150 mg total) by mouth daily. 90 tablet 3   furosemide  (LASIX ) 20 MG tablet Take 1 tablet (20 mg total) by mouth daily as needed for edema. Take with potassium 30 tablet 3   loratadine (CLARITIN) 10 MG tablet Take 10 mg by mouth daily. Pt takes over the counter Aller-itin for allergies.     meloxicam  (MOBIC ) 15 MG tablet Take 1 tablet (15 mg total) by mouth daily. 30 tablet 2   Moringa Oleifera (MORINGA PO) Take 1,000 mg by mouth daily. Moringa an over the counter multi vitamin supplement.     Multiple Vitamin (MULTIVITAMIN) capsule Take 1 capsule by mouth daily.     omeprazole  (PRILOSEC) 20 MG capsule Take 1 capsule (20 mg total) by mouth daily. 90 capsule 3   traZODone  (DESYREL ) 50 MG tablet Take 1 tablet (50 mg total) by mouth at bedtime as needed for sleep. 90 tablet 3   WEGOVY  0.5 MG/0.5ML SOAJ Inject 0.5 mg into the skin once a week. 2 mL 2   No facility-administered medications prior to visit.   Past Medical History:  Diagnosis Date   ADHD (attention deficit hyperactivity disorder)    Allergy    Anxiety    Apnea    Arthritis    Asthma    Atypical  chest pain    Complication of anesthesia    Wakes up angry and mean   Depression    Dysplasia of cervix, low grade (CIN 1) 11/09/2020   abnl pap 09/2020; colpo 04/2021 cin 1; repeat pap in 1 year per Dr. 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 Valtrex  prn outbreaks   Menorrhagia with regular cycle 11/09/2020   Normal endometrial biopsy 04/2021, Dr. Zina, gyn   Sleep apnea    done in [place]  per pt, does not wear CPAP   Past Surgical History:   Procedure Laterality Date   CERVICAL CONE BIOPSY     CESAREAN SECTION     x2   CHOLECYSTECTOMY     ENDOMETRIAL BIOPSY     TONSILLECTOMY     TUBAL LIGATION     VAGINAL HYSTERECTOMY Bilateral 01/02/2022   Procedure: HYSTERECTOMY VAGINAL WITH SALPINGECTOMY;  Surgeon: Zina Jerilynn LABOR, MD;  Location[facility] OR;  Service: Gynecology;  Laterality: Bilateral;   WISDOM TOOTH EXTRACTION     No Known Allergies    Objective:    Physical Exam Vitals and nursing note reviewed.  Constitutional:      Appearance: Normal appearance. She is not ill-appearing.     Interventions: Face mask in place.  HENT:     Right Ear: Tympanic membrane and ear canal normal. No drainage, swelling or tenderness. Tympanic membrane is not injected, erythematous or bulging.     Left Ear: Tympanic membrane and ear canal normal.     Nose:     Right Sinus: Frontal sinus tenderness present.     Left Sinus: Frontal sinus tenderness present.     Mouth/Throat:     Mouth: Mucous membranes are moist.     Pharynx: No pharyngeal swelling, oropharyngeal exudate, posterior oropharyngeal erythema or uvula swelling.     Tonsils: 0 on the right. 0 on the left.  Cardiovascular:     Rate and Rhythm: Normal rate and regular rhythm.  Pulmonary:     Effort: Pulmonary effort is normal.     Breath sounds: Normal breath sounds.  Musculoskeletal:        General: Normal range of motion.  Lymphadenopathy:     Head:     Right side of head: No preauricular or posterior auricular adenopathy.     Left side of head: No preauricular or posterior auricular adenopathy.     Cervical: No cervical adenopathy.  Skin:    General: Skin is warm and dry.  Neurological:     Mental Status: She is alert.  Psychiatric:        Mood and Affect: Mood normal.        Behavior: Behavior normal.    BP 119/73   Pulse 66   Temp 97.7 F (36.5 C)   Ht 5' 3 (1.6 m)   Wt 300 lb (136.1 kg)   LMP 12/14/2021   SpO2 99%   BMI 53.14 kg/m  Wt Readings from  Last 3 Encounters:  03/11/24 300 lb (136.1 kg)  02/17/24 297 lb (134.7 kg)  01/20/24 300 lb (136.1 kg)      Lucius Krabbe, NP

## 2024-03-12 ENCOUNTER — Other Ambulatory Visit (HOSPITAL_COMMUNITY): Payer: Self-pay

## 2024-03-12 ENCOUNTER — Telehealth: Payer: Self-pay

## 2024-03-12 NOTE — Telephone Encounter (Addendum)
 Pharmacy Patient Advocate Encounter   Received notification from Onbase that prior authorization for Hydrocortisone -Acetic Acid  1-2% solution is required/requested.   Insurance verification completed.   The patient is insured through [facility] .   Per test claim:  Acetic acid  2% is preferred by the insurance.  If suggested medication is appropriate, Please send in a new RX and discontinue this one. If not, please advise as to why it's not appropriate so that we may request a Prior Authorization. Please note, some preferred medications may still require a PA.  If the suggested medications have not been trialed and there are no contraindications to their use, the PA will not be submitted, as it will not be approved.  - Called pharmacy to switch to Acetic acid - per note to pharmacy from prescriber on prescription.

## 2024-03-19 ENCOUNTER — Encounter: Payer: Self-pay | Admitting: Podiatry

## 2024-03-19 ENCOUNTER — Ambulatory Visit (INDEPENDENT_AMBULATORY_CARE_PROVIDER_SITE_OTHER): Admitting: Podiatry

## 2024-03-19 DIAGNOSIS — M722 Plantar fascial fibromatosis: Secondary | ICD-10-CM

## 2024-03-19 MED ORDER — MELOXICAM 15 MG PO TABS: 15.0000 mg | ORAL_TABLET | Freq: Every day | ORAL | 2 refills | Status: DC

## 2024-03-19 MED ORDER — TRIAMCINOLONE ACETONIDE 10 MG/ML IJ SUSP
10.0000 mg | Freq: Once | INTRAMUSCULAR | Status: AC
Start: 2024-03-19 — End: 2024-03-19
  Administered 2024-03-19: 10 mg via INTRA_ARTICULAR

## 2024-03-20 NOTE — Progress Notes (Signed)
 Subjective:   Patient ID: Deborah Andrews, female   DOB: 38 y.o.   MRN: [id number]   HPI Patient presents with significant discomfort back again in the plantar heel stated she seemed to improve for several months and anti-inflammatories seem to help her   ROS      Objective:  Physical Exam  Neurovascular status intact with significant obesity and lymphedema present with discomfort in the plantar heel region bilateral fluid buildup     Assessment:  Chronic plantar fasciitis bilateral with inflammation with lymphedema is complicating factor     Plan:  H&P reviewed I went ahead today sterile prep I injected the fascia at insertion 3 mg Kenalog  5 mg Xylocaine  applied sterile dressing discussed Mobic  which was prescribed at the current time advised on ice therapy stretch and elevation.  Reappoint as symptoms indicate

## 2024-03-26 ENCOUNTER — Ambulatory Visit: Admitting: Podiatry

## 2024-05-07 ENCOUNTER — Telehealth (HOSPITAL_COMMUNITY): Payer: Self-pay | Admitting: Pharmacy Technician

## 2024-05-07 ENCOUNTER — Other Ambulatory Visit (HOSPITAL_COMMUNITY): Payer: Self-pay

## 2024-05-07 NOTE — Telephone Encounter (Signed)
 Pharmacy Patient Advocate Encounter   Received notification from CoverMyMeds that prior authorization for Wegovy  is due for renewal.   Insurance verification completed.   The patient is insured through [facility] MEDICAID.  Effective October 1st, Medicaid discontinued coverage of GLP1 medications for weight loss (such as Wegovy  and Zepbound ), unless the patient has a documented history of a heart attack or stroke. Zepbound  will continue to be covered only for patients with moderate to severe sleep apnea (AHI 15-30) and a BMI greater than 40. Because of this change, the prior authorization team will not be submitting new PA requests for GLP1 medications prescribed for weight loss, as patients will be unable to continue therapy under Medicaid coverage.   Key: [id number]

## 2024-05-19 ENCOUNTER — Telehealth: Payer: Self-pay

## 2024-05-19 ENCOUNTER — Ambulatory Visit: Admitting: Pulmonary Disease

## 2024-05-19 ENCOUNTER — Encounter: Payer: Self-pay | Admitting: Pulmonary Disease

## 2024-05-19 ENCOUNTER — Ambulatory Visit (INDEPENDENT_AMBULATORY_CARE_PROVIDER_SITE_OTHER): Admitting: Family Medicine

## 2024-05-19 ENCOUNTER — Encounter: Payer: Self-pay | Admitting: Family Medicine

## 2024-05-19 VITALS — BP 116/72 | HR 68 | Ht 63.5 in | Wt 297.0 lb

## 2024-05-19 DIAGNOSIS — F410 Panic disorder [episodic paroxysmal anxiety] without agoraphobia: Secondary | ICD-10-CM

## 2024-05-19 DIAGNOSIS — G4719 Other hypersomnia: Secondary | ICD-10-CM | POA: Diagnosis not present

## 2024-05-19 DIAGNOSIS — F3342 Major depressive disorder, recurrent, in full remission: Secondary | ICD-10-CM

## 2024-05-19 DIAGNOSIS — Z87898 Personal history of other specified conditions: Secondary | ICD-10-CM

## 2024-05-19 MED ORDER — BUPROPION HCL ER (XL) 300 MG PO TB24: 300.0000 mg | ORAL_TABLET | Freq: Every day | ORAL | 3 refills | Status: AC

## 2024-05-19 MED ORDER — ALPRAZOLAM 0.5 MG PO TABS: 0.5000 mg | ORAL_TABLET | Freq: Every day | ORAL | 0 refills | Status: AC | PRN

## 2024-05-19 MED ORDER — ZEPBOUND 2.5 MG/0.5ML ~~LOC~~ SOAJ: 2.5000 mg | SUBCUTANEOUS | 2 refills | Status: DC

## 2024-05-19 NOTE — Patient Instructions (Addendum)
 Please return in April for CPE  If you have any questions or concerns, please don't hesitate to send me a message via MyChart or call the office at [phone]. Thank you for visiting with us  today! It's our pleasure caring for you.   I have ordered Zepbound  for you.  We will see if we can get it approved.

## 2024-05-19 NOTE — Progress Notes (Signed)
 Deborah Andrews    [id number]    07/31/1985  Primary Care Physician:Andy, Lavern CROME, MD  Referring Physician: Jodie Lavern CROME, MD [address] [place],  [place] [address]  Chief complaint:   Patient being seen for sleep disordered breathing   HPI:  Difficulty with falling asleep, difficulty with staying asleep - Has been using trazodone  at 50 mg something seems to be helping  She recently had a sleep study in 2024 that was negative for significant sleep apnea, did show some desaturations  Has had sleep issues since about age 38 Has had multiple sleep studies previously that were positive for sleep disordered breathing  Did have some difficulty tolerating CPAP previously  She does have a history of asthma, obesity, depression  Reformed smoker  CT with calcified left lower lobe nodule-likely granulomatous change  She was started on CPAP previously but struggled with using it she felt the pressure was too high even at the lowest pressure  She has been losing weight recently Less snoring according to family members  No sleepy driving No parasomnias  Falls asleep quickly with trazodone  Usually about 1 awakening Denies any dryness of her mouth She has constant headaches She does have night sweats  PRINTED eyelashes good thank you Outpatient Encounter Medications as of 05/19/2024  Medication Sig   acetic acid -hydrocortisone  (VOSOL -HC) OTIC solution Place 3 drops into the right ear 2 (two) times daily. Apply to affected ear. For ear canal itching, pain.   albuterol  (VENTOLIN  HFA) 108 (90 Base) MCG/ACT inhaler Inhale 2 puffs into the lungs every 4 (four) hours as needed for wheezing or shortness of breath.   ALPRAZolam  (XANAX ) 0.5 MG tablet Take 1 tablet (0.5 mg total) by mouth daily as needed (Panic attack).   buPROPion  (WELLBUTRIN  XL) 300 MG 24 hr tablet Take 1 tablet (300 mg total) by mouth daily.   furosemide  (LASIX ) 20 MG tablet Take 1 tablet (20 mg  total) by mouth daily as needed for edema. Take with potassium   loratadine (CLARITIN) 10 MG tablet Take 10 mg by mouth daily. Pt takes over the counter Aller-itin for allergies.   meloxicam  (MOBIC ) 15 MG tablet Take 1 tablet (15 mg total) by mouth daily.   meloxicam  (MOBIC ) 15 MG tablet Take 1 tablet (15 mg total) by mouth daily.   Moringa Oleifera (MORINGA PO) Take 1,000 mg by mouth daily. Moringa an over the counter multi vitamin supplement.   Multiple Vitamin (MULTIVITAMIN) capsule Take 1 capsule by mouth daily.   omeprazole  (PRILOSEC) 20 MG capsule Take 1 capsule (20 mg total) by mouth daily.   tirzepatide  (ZEPBOUND ) 2.5 MG/0.5ML Pen Inject 2.5 mg into the skin once a week. Please start prior authorization, failed Wegovy  due to GI side effects including watery diarrhea   traZODone  (DESYREL ) 50 MG tablet Take 1 tablet (50 mg total) by mouth at bedtime as needed for sleep.   [DISCONTINUED] buPROPion  (WELLBUTRIN  XL) 150 MG 24 hr tablet Take 1 tablet (150 mg total) by mouth daily.   [DISCONTINUED] WEGOVY  0.5 MG/0.5ML SOAJ Inject 0.5 mg into the skin once a week.   No facility-administered encounter medications on file as of 05/19/2024.    Allergies as of 05/19/2024   (No Known Allergies)    Past Medical History:  Diagnosis Date   ADHD (attention deficit hyperactivity disorder)    Allergy    Anxiety    Apnea    Arthritis    Asthma  Atypical chest pain    Complication of anesthesia    Wakes up angry and mean   Depression    Dysplasia of cervix, low grade (CIN 1) 11/09/2020   abnl pap 09/2020; colpo 04/2021 cin 1; repeat pap in 1 year per Dr. 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 Valtrex  prn outbreaks   Menorrhagia with regular cycle 11/09/2020   Normal endometrial biopsy 04/2021, Dr. Zina, gyn   Sleep apnea    done in [place]  per pt,  does not wear CPAP    Past Surgical History:  Procedure Laterality Date   CERVICAL CONE BIOPSY     CESAREAN SECTION     x2   CHOLECYSTECTOMY     ENDOMETRIAL BIOPSY     TONSILLECTOMY     TUBAL LIGATION     VAGINAL HYSTERECTOMY Bilateral 01/02/2022   Procedure: HYSTERECTOMY VAGINAL WITH SALPINGECTOMY;  Surgeon: Zina Jerilynn LABOR, MD;  Location: [facility] OR;  Service: Gynecology;  Laterality: Bilateral;   WISDOM TOOTH EXTRACTION      Family History  Problem Relation Age of Onset   Diabetes Mother    Hypertension Mother    Breast cancer Mother 49       recur 42, 5, 74,35   Cancer Mother 62       Gyn. ?cervical ?ovarian    Cancer - Other Mother 24       unknown primary. ?kidney   Diabetes Father    Breast cancer Maternal Aunt    Skin cancer Maternal Aunt        x3   Breast cancer Maternal Aunt    Breast cancer Maternal Aunt    Breast cancer Maternal Aunt    Breast cancer Maternal Uncle 21       female breast cancer   Melanoma Maternal Grandmother    Melanoma Maternal Grandfather    Cancer Maternal Grandfather        unknown type   Breast cancer Cousin    Breast cancer Cousin    Breast cancer Cousin    Leukemia Half-Brother 2       maternal half brother   Cervical cancer Half-Sister 12       maternal half sister    Social History   Socioeconomic History   Marital status: Significant Other    Spouse name: boyfriend   Number of children: Not on file   Years of education: Not on file   Highest education level: GED or equivalent  Occupational History   Not on file  Tobacco Use   Smoking status: Former    Current packs/day: 0.00    Average packs/day: 4.0 packs/day for 22.0 years (88.0 ttl pk-yrs)    Types: Cigarettes    Quit date: 10/2022    Years since quitting: 1.5   Smokeless tobacco: Never   Tobacco comments:    Currently smoking 1ppd as of 03/30/22 ep  Vaping Use   Vaping status: Never Used  Substance and Sexual Activity   Alcohol use: Yes    Comment: occ- on  holidays   Drug use: Never   Sexual activity: Yes    Birth control/protection: Surgical  Other Topics Concern   Not on file  Social History Narrative   Not on file   Social Drivers of Health   Financial Resource Strain: High Risk (01/19/2024)  Overall Financial Resource Strain (CARDIA)    Difficulty of Paying Living Expenses: Very hard  Food Insecurity: Food Insecurity Present (01/19/2024)   Hunger Vital Sign    Worried About Running Out of Food in the Last Year: Often true    Ran Out of Food in the Last Year: Often true  Transportation Needs: No Transportation Needs (01/19/2024)   PRAPARE - Administrator, Civil Service (Medical): No    Lack of Transportation (Non-Medical): No  Physical Activity: Sufficiently Active (01/19/2024)   Exercise Vital Sign    Days of Exercise per Week: 4 days    Minutes of Exercise per Session: 150+ min  Stress: Stress Concern Present (01/19/2024)   [employer] of Occupational Health - Occupational Stress Questionnaire    Feeling of Stress: Very much  Social Connections: Socially Isolated (01/19/2024)   Social Connection and Isolation Panel    Frequency of Communication with Friends and Family: Once a week    Frequency of Social Gatherings with Friends and Family: Never    Attends Religious Services: Never    Database Administrator or Organizations: No    Attends Engineer, Structural: Not on file    Marital Status: Divorced  Intimate Partner Violence: Unknown (02/21/2023)   Received from [facility]   HITS    Physically Hurt: Not on file    Insult or Talk Down To: Not on file    Threaten Physical Harm: Not on file    Scream or Curse: Not on file    Review of Systems  Respiratory:  Positive for shortness of breath.   Psychiatric/Behavioral:  Positive for sleep disturbance.     Vitals:   05/19/24 1116  BP: 116/72  Pulse: 68  SpO2: 99%     Physical Exam Constitutional:      Appearance: She is obese.  HENT:      Nose: Nose normal.     Mouth/Throat:     Mouth: Mucous membranes are moist.     Comments: Crowded oropharynx Eyes:     General: No scleral icterus. Cardiovascular:     Rate and Rhythm: Normal rate and regular rhythm.     Heart sounds: No murmur heard.    No friction rub.  Pulmonary:     Effort: No respiratory distress.     Breath sounds: No stridor. No wheezing or rhonchi.  Musculoskeletal:     Cervical back: No rigidity.  Neurological:     Mental Status: She is alert.  Psychiatric:        Mood and Affect: Mood normal.    ESS of 13  Data Reviewed: Previous sleep study was negative for significant sleep apnea  Past CT scan of the chest with some calcified adenopathy  Home sleep study 08/02/2022 shows no significant sleep apnea but significant oxygen desaturations, sleep quality may not been optimal on the day of the study according to patient  Assessment:  Excessive daytime sleepiness - May be related to untreated sleep disordered breathing  Nonrestorative sleep  Some shortness of breath with activity during the day with desaturation's  Goals of sleep disordered breathing discussed Treatment options discussed  Class III obesity  Plan/Recommendations:  Schedule in-lab sleep study  Encouraged to continue to focus on weight loss efforts  Graded exercise as tolerated  Follow-up in about 3 months  Risk of not treating sleep disordered breathing discussed    Jennet Epley MD [facility] 05/19/2024, 11:34 AM  CC: Jodie,  Lavern CROME, MD

## 2024-05-19 NOTE — Telephone Encounter (Signed)
 Copied from CRM #[id number]. Topic: Clinical - Medication Prior Auth >> May 19, 2024 10:56 AM Deaijah H wrote: Reason for CRM: [facility]. W/ [employer] called in stating an authorization is needed for tirzepatide  (ZEPBOUND ) 2.5 MG/0.5ML Pen. If needed, please call [phone]  Please see note

## 2024-05-19 NOTE — Patient Instructions (Signed)
 I will see you back in about 3 months  I will schedule you for an in-lab sleep study to ascertain if you have significant sleep apnea  Continue weight loss efforts  Call us  with significant concerns

## 2024-05-19 NOTE — Progress Notes (Signed)
 Subjective  CC:  Chief Complaint  Patient presents with   Morbid obesity due to excess calories     Pt stated that she stopped the wegovey bc she was having diarrhea and it was getting worse. It has been 2 mos since she stopped it.    HPI: Deborah Andrews is a 38 y.o. female who presents to the office today to address the problems listed above in the chief complaint. 38 year old female with morbid obesity who has been losing weight on Wegovy , however had to stop it 2 months ago due to worsening GI side effects including loose watery diarrhea.  Her weight is stable, has maintained weight loss but is noting an increase in hunger again.  She has been so busy that she is only eating about 1 meal per day.  Trying to get and clean proteins.  Limited exercise.  High stress.  She did not have any other side effects from Wegovy .  She would be interested in trying Zepbound . Depression: Has noted improvement on Wellbutrin  XL 150 mg daily.  Still having some panic symptoms.  This is mostly related to high stress job.  She is requesting Xanax  to use intermittently.  She tried and failed BuSpar in the past.  We could consider hydroxyzine but this would make her sleepy.I reviewed patient's records from the PMP aware controlled substance registry today.  No recent prescription for benzodiazepines in the last year.  Assessment  1. Morbid obesity due to excess calories (HCC)   2. Depression, major, recurrent, in complete remission   3. Panic disorder      Plan  Morbid obesity: Excellent candidate for GLP-1's.  Will start polymerization for Zepbound  due to failure with Wegovy  due to significant and persistent side effects, GI related. Discussed nutrition, protein intake and exercise.  Recommend strength training Depression is much improved, still with some anxiety and stress management issues so we will increase Wellbutrin  from 150 mg XL daily to 300 mg daily. Xanax  ordered to be used only if needed for  panic symptoms.  Patient agrees  Follow up: April for complete physical, sooner if needed for weight management or anxiety Visit date not found  No orders of the defined types were placed in this encounter.  Meds ordered this encounter  Medications   tirzepatide  (ZEPBOUND ) 2.5 MG/0.5ML Pen    Sig: Inject 2.5 mg into the skin once a week. Please start prior authorization, failed Wegovy  due to GI side effects including watery diarrhea    Dispense:  2 mL    Refill:  2   buPROPion  (WELLBUTRIN  XL) 300 MG 24 hr tablet    Sig: Take 1 tablet (300 mg total) by mouth daily.    Dispense:  90 tablet    Refill:  3    Increasing dose up from 150 mg daily   ALPRAZolam  (XANAX ) 0.5 MG tablet    Sig: Take 1 tablet (0.5 mg total) by mouth daily as needed (Panic attack).    Dispense:  20 tablet    Refill:  0      I reviewed the patients updated PMH, FH, and SocHx.    Patient Active Problem List   Diagnosis Date Noted   Precordial pain 11/08/2022    Priority: High   Snoring 03/30/2022    Priority: High   At high risk for breast cancer 08/04/2020    Priority: High   Former smoker 06/07/2020    Priority: High   Morbid obesity due to excess calories (  HCC) 06/07/2020    Priority: High   Depression, major, recurrent, in complete remission 06/07/2020    Priority: High   GERD (gastroesophageal reflux disease) 02/11/2023    Priority: Medium    Leukocytosis 11/08/2022    Priority: Medium    Dependent edema 04/20/2022    Priority: Medium    Syncope 2022 08/03/2021    Priority: Medium    Mild intermittent asthma without complication 06/07/2020    Priority: Medium    Left lower lobe pulmonary nodule 11/08/2022    Priority: Low   S/P vaginal hysterectomy 01/02/2022    Priority: Low   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 Apply to affected ear. For ear canal itching, pain.   albuterol  (VENTOLIN  HFA) 108 (90 Base) MCG/ACT inhaler Inhale 2 puffs into the lungs every 4 (four) hours as needed for wheezing or shortness of breath.   ALPRAZolam  (XANAX ) 0.5 MG tablet Take 1 tablet (0.5 mg total) by mouth daily as needed (Panic attack).   furosemide  (LASIX ) 20 MG tablet Take 1 tablet (20 mg total) by mouth daily as needed for edema. Take with potassium   loratadine (CLARITIN) 10 MG tablet Take 10 mg by mouth daily. Pt takes over the counter Aller-itin for allergies.   meloxicam  (MOBIC ) 15 MG tablet Take 1 tablet (15 mg total) by mouth daily.   meloxicam  (MOBIC ) 15 MG tablet Take 1 tablet (15 mg total) by mouth daily.   Moringa Oleifera (MORINGA PO) Take 1,000 mg by mouth daily. Moringa an over the counter multi vitamin supplement.   Multiple Vitamin (MULTIVITAMIN) capsule Take 1 capsule by mouth daily.   omeprazole  (PRILOSEC) 20 MG capsule Take 1 capsule (20 mg total) by mouth daily.   tirzepatide  (ZEPBOUND ) 2.5 MG/0.5ML Pen Inject 2.5 mg into the skin once a week. Please start prior authorization, failed Wegovy  due to GI side effects including watery diarrhea   traZODone  (DESYREL ) 50 MG tablet Take 1 tablet (50 mg total) by mouth at bedtime as needed for sleep.   [DISCONTINUED] buPROPion  (WELLBUTRIN  XL) 150 MG 24 hr tablet Take 1 tablet (150 mg total) by mouth daily.   [DISCONTINUED] WEGOVY  0.5 MG/0.5ML SOAJ Inject 0.5 mg into the skin once a week.    Allergies: Patient has no known allergies. Family History: Patient family history includes Breast cancer in her cousin, cousin, cousin, maternal aunt, maternal aunt, maternal aunt, and maternal aunt; Breast cancer (age of onset: 43) in her mother; Breast cancer (age of onset: 61) in her maternal uncle; Cancer in her maternal grandfather; Cancer (age of onset: 63) in her mother; Cancer - Other (age of onset: 35) in her mother; Cervical cancer (age of onset:  36) in her half-sister; Diabetes in her father and mother; Hypertension in her mother; Leukemia (age of onset: 2) in her half-brother; Melanoma in her maternal grandfather and maternal grandmother; Skin cancer in her maternal aunt. Social History:  Patient  reports that she quit smoking about 18 months ago. Her smoking use included cigarettes. She has a 88 pack-year smoking history. She has never used smokeless tobacco. She reports current alcohol use. She reports that she does not use drugs.  Review of Systems: Constitutional: Negative for fever malaise or anorexia Cardiovascular: negative for chest pain Respiratory: negative for SOB or persistent  cough Gastrointestinal: negative for abdominal pain  Objective  Vitals: BP 120/60   Pulse 71   Temp 98.2 F (36.8 C)   Ht 5' 3 (1.6 m)   Wt 295 lb 12.8 oz (134.2 kg)   LMP 12/14/2021   SpO2 98%   BMI 52.40 kg/m  General: no acute distress , A&Ox3 Psych: Mood is happy, conversive HEENT: PEERL, conjunctiva normal, neck is supple Cardiovascular:  RRR without murmur or gallop.  Respiratory:  Good breath sounds bilaterally, CTAB with normal respiratory effort Skin:  Warm, no rashes  Commons side effects, risks, benefits, and alternatives for medications and treatment plan prescribed today were discussed, and the patient expressed understanding of the given instructions. Patient is instructed to call or message via MyChart if he/she has any questions or concerns regarding our treatment plan. No barriers to understanding were identified. We discussed Red Flag symptoms and signs in detail. Patient expressed understanding regarding what to do in case of urgent or emergency type symptoms.  Medication list was reconciled, printed and provided to the patient in AVS. Patient instructions and summary information was reviewed with the patient as documented in the AVS. This note was prepared with assistance of Dragon voice recognition software. Occasional  wrong-word or sound-a-like substitutions may have occurred due to the inherent limitations of voice recognition software

## 2024-05-20 ENCOUNTER — Other Ambulatory Visit (HOSPITAL_COMMUNITY): Payer: Self-pay

## 2024-05-20 ENCOUNTER — Telehealth: Payer: Self-pay

## 2024-05-20 NOTE — Telephone Encounter (Signed)

## 2024-05-20 NOTE — Telephone Encounter (Signed)
 Spoke with pt to let her know the discission that was made for PA. Pt stated that she does have a Hx of sleep apnea so She stated that she will try and get the documentation that supports this and once she has the documentation then we can resubmit the PA

## 2024-05-21 NOTE — Telephone Encounter (Signed)
 I have spoken with pt regarding this and she is suppose to get documentation for us  to show that she may have more or one of the Dx

## 2024-05-30 ENCOUNTER — Ambulatory Visit (HOSPITAL_COMMUNITY): Admission: EM | Admit: 2024-05-30 | Discharge: 2024-05-30 | Disposition: A | Discharge status: Home / Self Care

## 2024-05-30 ENCOUNTER — Ambulatory Visit
Admission: EM | Admit: 2024-05-30 | Discharge: 2024-05-30 | Disposition: A | Discharge status: Home / Self Care | Attending: Internal Medicine | Admitting: Internal Medicine

## 2024-05-30 ENCOUNTER — Ambulatory Visit (INDEPENDENT_AMBULATORY_CARE_PROVIDER_SITE_OTHER)

## 2024-05-30 ENCOUNTER — Ambulatory Visit: Admit: 2024-05-30

## 2024-05-30 ENCOUNTER — Emergency Department (HOSPITAL_COMMUNITY): Admission: EM | Admit: 2024-05-30 | Discharge status: Absent without leave | Source: Ambulatory Visit

## 2024-05-30 ENCOUNTER — Encounter (HOSPITAL_COMMUNITY): Payer: Self-pay

## 2024-05-30 DIAGNOSIS — M79601 Pain in right arm: Secondary | ICD-10-CM

## 2024-05-30 NOTE — ED Provider Notes (Signed)
 [facility]  Note:  This document was prepared using Conservation officer, historic buildings and may include unintentional dictation errors.  MRN: [id number] DOB: January 04, 1986  Subjective:   Deborah Andrews is a 38 y.o. female presenting for evaluation of right forearm pain, swelling, bruising x 3 to 4 days.  Patient reports that she accidentally smashed her right forearm between 2 wood pallets a few days ago.  Patient believes that the area was just bruised however she continues to have burning pain and bruising and continued swelling which prompted her to come for evaluation and possible need for x-ray.  Patient denies any prior history of forearm trauma or injury.  No previous fractures.  Patient denies taking any medication over-the-counter for pain control.  No current facility-administered medications for this encounter.  Current Outpatient Medications:    acetic acid -hydrocortisone  (VOSOL -HC) OTIC solution, Place 3 drops into the right ear 2 (two) times daily. Apply to affected ear. For ear canal itching, pain., Disp: 10 mL, Rfl: 0   buPROPion  (WELLBUTRIN  XL) 300 MG 24 hr tablet, Take 1 tablet (300 mg total) by mouth daily., Disp: 90 tablet, Rfl: 3   furosemide  (LASIX ) 20 MG tablet, Take 1 tablet (20 mg total) by mouth daily as needed for edema. Take with potassium, Disp: 30 tablet, Rfl: 3   loratadine (CLARITIN) 10 MG tablet, Take 10 mg by mouth daily. Pt takes over the counter Aller-itin for allergies., Disp: , Rfl:    meloxicam  (MOBIC ) 15 MG tablet, Take 1 tablet (15 mg total) by mouth daily., Disp: 30 tablet, Rfl: 2   Moringa Oleifera (MORINGA PO), Take 1,000 mg by mouth daily. Moringa an over the counter multi vitamin supplement., Disp: , Rfl:    Multiple Vitamin (MULTIVITAMIN) capsule, Take 1 capsule by mouth daily., Disp: , Rfl:    omeprazole  (PRILOSEC) 20 MG capsule, Take 1 capsule (20 mg total) by mouth daily., Disp: 90 capsule, Rfl: 3   traZODone  (DESYREL ) 50 MG tablet,  Take 1 tablet (50 mg total) by mouth at bedtime as needed for sleep., Disp: 90 tablet, Rfl: 3   albuterol  (VENTOLIN  HFA) 108 (90 Base) MCG/ACT inhaler, Inhale 2 puffs into the lungs every 4 (four) hours as needed for wheezing or shortness of breath., Disp: 18 g, Rfl: 5   ALPRAZolam  (XANAX ) 0.5 MG tablet, Take 1 tablet (0.5 mg total) by mouth daily as needed (Panic attack)., Disp: 20 tablet, Rfl: 0   meloxicam  (MOBIC ) 15 MG tablet, Take 1 tablet (15 mg total) by mouth daily., Disp: 30 tablet, Rfl: 2   tirzepatide  (ZEPBOUND ) 2.5 MG/0.5ML Pen, Inject 2.5 mg into the skin once a week. Please start prior authorization, failed Wegovy  due to GI side effects including watery diarrhea, Disp: 2 mL, Rfl: 2   No Known Allergies  Past Medical History:  Diagnosis Date   ADHD (attention deficit hyperactivity disorder)    Allergy    Anxiety    Apnea    Arthritis    Asthma    Atypical chest pain    Complication of anesthesia    Wakes up angry and mean   Depression    Dysplasia of cervix, low grade (CIN 1) 11/09/2020   abnl pap 09/2020; colpo 04/2021 cin 1; repeat pap in 1 year per Dr. 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 Valtrex  prn outbreaks  Menorrhagia with regular cycle 11/09/2020   Normal endometrial biopsy 04/2021, Dr. Zina, gyn   Sleep apnea    done in [place]  per pt, does not wear CPAP     Past Surgical History:  Procedure Laterality Date   CERVICAL CONE BIOPSY     CESAREAN SECTION     x2   CHOLECYSTECTOMY     ENDOMETRIAL BIOPSY     TONSILLECTOMY     TUBAL LIGATION     VAGINAL HYSTERECTOMY Bilateral 01/02/2022   Procedure: HYSTERECTOMY VAGINAL WITH SALPINGECTOMY;  Surgeon: Zina Jerilynn LABOR, MD;  Location: [facility] OR;  Service: Gynecology;  Laterality: Bilateral;   WISDOM TOOTH EXTRACTION      Family History  Problem Relation Age of Onset    Diabetes Mother    Hypertension Mother    Breast cancer Mother 5       recur 75, 5, 35,35   Cancer Mother 57       Gyn. ?cervical ?ovarian    Cancer - Other Mother 19       unknown primary. ?kidney   Diabetes Father    Breast cancer Maternal Aunt    Skin cancer Maternal Aunt        x3   Breast cancer Maternal Aunt    Breast cancer Maternal Aunt    Breast cancer Maternal Aunt    Breast cancer Maternal Uncle 45       female breast cancer   Melanoma Maternal Grandmother    Melanoma Maternal Grandfather    Cancer Maternal Grandfather        unknown type   Breast cancer Cousin    Breast cancer Cousin    Breast cancer Cousin    Leukemia Half-Brother 2       maternal half brother   Cervical cancer Half-Sister 67       maternal half sister    Social History   Tobacco Use   Smoking status: Former    Current packs/day: 0.00    Average packs/day: 4.0 packs/day for 22.0 years (88.0 ttl pk-yrs)    Types: Cigarettes    Quit date: 10/2022    Years since quitting: 1.6   Smokeless tobacco: Never   Tobacco comments:    Currently smoking 1ppd as of 03/30/22 ep  Vaping Use   Vaping status: Never Used  Substance Use Topics   Alcohol use: Yes    Comment: occ- on holidays   Drug use: Never    ROS Refer to HPI for ROS details.  Objective:    Vitals: BP 119/84 (BP Location: Left Arm)   Pulse 87   Temp 98.6 F (37 C) (Oral)   Resp 18   LMP 12/14/2021   SpO2 95%   Physical Exam Vitals and nursing note reviewed.  Constitutional:      General: She is not in acute distress.    Appearance: Normal appearance. She is not ill-appearing.  HENT:     Head: Normocephalic.  Cardiovascular:     Rate and Rhythm: Normal rate.  Pulmonary:     Effort: Pulmonary effort is normal. No respiratory distress.  Musculoskeletal:     Right forearm: Swelling, tenderness and bony tenderness present. No edema or deformity.       Arms:  Skin:    General: Skin is warm and dry.     Capillary  Refill: Capillary refill takes less than 2 seconds.  Neurological:     General: No focal deficit present.     Mental Status: She  is alert and oriented to person, place, and time.  Psychiatric:        Mood and Affect: Mood normal.        Behavior: Behavior normal.     Procedures  No results found for this or any previous visit (from the past 24 hours).  Assessment and Plan :     Discharge Instructions       1. Right arm pain (Primary) - Apply Wrist brace to right forearm for immobilization and protection. - Apply Sling & Swathe to right forearm for comfort and protection. - XR Forearm Right; Future, ordered to be completed after discharge from UC.  Please go to [facility] emergency room and inform them that you have an outpatient x-ray order from urgent care.  Tell them that you do not need to see the ER physician you are just there for imaging. - If x-ray is positive, referral for orthopedic surgery for follow-up will be provided.  Also if x-ray is positive patient may return to urgent care clinic for application of postmold splint for further protection until follow-up with orthopedics. - Take ibuprofen  800 mg or Tylenol  1000 mg every 6-8 hours as needed for pain secondary to right forearm contusion/injury.      Brooke Steinhilber B Icker Swigert   Wilmoth Rasnic, [place] B, NP 05/30/24 1002

## 2024-05-30 NOTE — ED Triage Notes (Addendum)
 Patient presents with  right arm pain and swelling. Patient states her arm was smash between a wood pallet.

## 2024-05-30 NOTE — ED Triage Notes (Signed)
 No charge encounter. Xray only.

## 2024-05-30 NOTE — Discharge Instructions (Addendum)
  1. Right arm pain (Primary) - Apply Wrist brace to right forearm for immobilization and protection. - Apply Sling & Swathe to right forearm for comfort and protection. - XR Forearm Right; Future, ordered to be completed after discharge from UC.  Please go to [facility] emergency room and inform them that you have an outpatient x-ray order from urgent care.  Tell them that you do not need to see the ER physician you are just there for imaging. - If x-ray is positive, referral for orthopedic surgery for follow-up will be provided.  Also if x-ray is positive patient may return to urgent care clinic for application of postmold splint for further protection until follow-up with orthopedics. - Take ibuprofen  800 mg or Tylenol  1000 mg every 6-8 hours as needed for pain secondary to right forearm contusion/injury.

## 2024-05-30 NOTE — ED Triage Notes (Signed)
 X-ray only.   Documentation encounter only.

## 2024-07-23 ENCOUNTER — Emergency Department (HOSPITAL_COMMUNITY)
Admission: EM | Admit: 2024-07-23 | Discharge: 2024-07-23 | Discharge status: Against Medical Advice | Attending: Emergency Medicine | Admitting: Emergency Medicine

## 2024-07-23 ENCOUNTER — Other Ambulatory Visit: Payer: Self-pay

## 2024-07-23 ENCOUNTER — Encounter (HOSPITAL_COMMUNITY): Payer: Self-pay | Admitting: *Deleted

## 2024-07-23 ENCOUNTER — Emergency Department (HOSPITAL_COMMUNITY)

## 2024-07-23 DIAGNOSIS — Z5321 Procedure and treatment not carried out due to patient leaving prior to being seen by health care provider: Secondary | ICD-10-CM | POA: Insufficient documentation

## 2024-07-23 DIAGNOSIS — R0789 Other chest pain: Secondary | ICD-10-CM | POA: Diagnosis present

## 2024-07-23 LAB — BASIC METABOLIC PANEL WITH GFR
Anion gap: 11 (ref 5–15)
BUN: 9 mg/dL (ref 6–20)
CO2: 23 mmol/L (ref 22–32)
Calcium: 9.4 mg/dL (ref 8.9–10.3)
Chloride: 103 mmol/L (ref 98–111)
Creatinine, Ser: 0.87 mg/dL (ref 0.44–1.00)
GFR, Estimated: >60 mL/min
Glucose, Bld: 88 mg/dL (ref 70–99)
Potassium: 4.2 mmol/L (ref 3.5–5.1)
Sodium: 138 mmol/L (ref 135–145)

## 2024-07-23 LAB — CBC
HCT: 41.7 % (ref 36.0–46.0)
Hemoglobin: 13.8 g/dL (ref 12.0–15.0)
MCH: 30 pg (ref 26.0–34.0)
MCHC: 33.1 g/dL (ref 30.0–36.0)
MCV: 90.7 fL (ref 80.0–100.0)
Platelets: 425 K/uL — ABNORMAL HIGH (ref 150–400)
RBC: 4.6 MIL/uL (ref 3.87–5.11)
RDW: 13.1 % (ref 11.5–15.5)
WBC: 10.8 K/uL — ABNORMAL HIGH (ref 4.0–10.5)
nRBC: 0 % (ref 0.0–0.2)

## 2024-07-23 LAB — TROPONIN T, HIGH SENSITIVITY: Troponin T High Sensitivity: <15 ng/L (ref 0–19)

## 2024-07-23 NOTE — ED Provider Triage Note (Signed)
 Emergency Medicine Provider Triage Evaluation Note  Deborah Andrews , a 39 y.o. female  was evaluated in triage.  Pt complains of chest pain.  Patient denies any history of coronary artery disease but does have history of heart rhythm problems.  She started having pain in the center of her chest today.  It radiates to her fingertips where she feels tingling.  Patient denies any fevers.  No coughing.  Review of Systems  Positive: Chest pain Negative:   Physical Exam  BP 127/77   Pulse 70   Temp 99 F (37.2 C) (Oral)   Resp 18   Ht 1.613 m (5' 3.5)   Wt 134.7 kg   LMP 12/14/2021   SpO2 100%   BMI 51.78 kg/m  Gen:   Awake, no distress   Resp:  Normal effort  MSK:   Moves extremities without difficulty  Other:    Medical Decision Making  Medically screening exam initiated at 1:56 PM.  Appropriate orders placed.  Katheren Jimmerson was informed that the remainder of the evaluation will be completed by another provider, this initial triage assessment does not replace that evaluation, and the importance of remaining in the ED until their evaluation is complete.  Initial EKG without signs of ST elevation.  Will proceed with cardiac workup   Randol Simmonds, MD 07/23/24 1358

## 2024-07-23 NOTE — ED Triage Notes (Signed)
 Pt here via [facility] from work for L sided chest pain while at work. No chest pain upon arrival of EMS.  Pt took 325 mg asa, no nitroglycerin .  Hx of congential heart condition, but pt is unaware of name.  Pain increased to 6/10 while arriving in ED.  EKG showed some irregularities.

## 2024-07-24 ENCOUNTER — Encounter: Payer: Self-pay | Admitting: Internal Medicine

## 2024-07-24 ENCOUNTER — Ambulatory Visit: Attending: Internal Medicine | Admitting: Internal Medicine

## 2024-07-24 ENCOUNTER — Telehealth: Payer: Self-pay | Admitting: Internal Medicine

## 2024-07-24 VITALS — BP 104/66 | HR 76 | Ht 63.5 in | Wt 295.2 lb

## 2024-07-24 DIAGNOSIS — Z87891 Personal history of nicotine dependence: Secondary | ICD-10-CM | POA: Insufficient documentation

## 2024-07-24 DIAGNOSIS — R072 Precordial pain: Secondary | ICD-10-CM | POA: Diagnosis present

## 2024-07-24 MED ORDER — ISOSORBIDE MONONITRATE ER 30 MG PO TB24: 30.0000 mg | ORAL_TABLET | Freq: Every day | ORAL | 3 refills | Status: AC

## 2024-07-24 NOTE — Telephone Encounter (Signed)
" ° °  Pt c/o of Chest Pain: STAT if active CP, including tightness, pressure, jaw pain, radiating pain to shoulder/upper arm/back, CP unrelieved by Nitro. Symptoms reported of SOB, nausea, vomiting, sweating.  1. Are you having CP right now? Yes, A little     2. Are you experiencing any other symptoms (ex. SOB, nausea, vomiting, sweating)? No   3. Is your CP continuous or coming and going? coming and going   4. Have you taken Nitroglycerin ? No   5. How long have you been experiencing CP? Yesterday 07/23/24    6. If NO CP at time of call then end call with telling Pt to call back or call 911 if Chest pain returns prior to return call from triage team.  "

## 2024-07-24 NOTE — Telephone Encounter (Signed)
 Spoke with pt regarding a work note. Pt saw DOD Dr. Santo today but forgot to ask about a work note and about when she can return to work. Pt was told her questions would be sent to Dr. Santo. Pt verbalized understanding. All questions if any were answered.

## 2024-07-24 NOTE — Telephone Encounter (Signed)
 Patient wants to get a doctor's note regarding her chest pain and how long she should be off work and when she can go back to work.

## 2024-07-24 NOTE — Telephone Encounter (Signed)
 Spoke with pt regarding her chest pain. Pt stated she had an episode at work yesterday where she started to feel not like herself. The pt stated she felt weak and dizzy and had body aches and then began to have chest pain and right arm numbness. Pt stated she went by ambulance to the hospital where she was told there was a 20 hour wait and she went home. Pt stated the chest pain lasted all day and was intense at times. Pt stated she took ibuprofen  and aspirin. Pt stated she feels sore today but does not have any chest pain or shortness of breath today. Pt was made an appointment with DOD today Dr. Santo. Pt was given ED precautions for the meantime. Pt verbalized understanding. All questions if any were answered.

## 2024-07-24 NOTE — Patient Instructions (Signed)
 Medication Instructions:  Your physician recommends that you continue on your current medications as directed. Please refer to the Current Medication list given to you today.  START: isosorbide  mononitrate (Imdur ) 30 mg by mouth once daily  *If you need a refill on your cardiac medications before your next appointment, please call your pharmacy*  Lab Work: NONE  If you have labs (blood work) drawn today and your tests are completely normal, you will receive your results only by: MyChart Message (if you have MyChart) OR A paper copy in the mail If you have any lab test that is abnormal or we need to change your treatment, we will call you to review the results.  Testing/Procedures: Your physician has requested that you have a Cardiac PET Scan.   Follow-Up: At [facility], you and your health needs are our priority.  As part of our continuing mission to provide you with exceptional heart care, our providers are all part of one team.  This team includes your primary Cardiologist (physician) and Advanced Practice Providers or APPs (Physician Assistants and Nurse Practitioners) who all work together to provide you with the care you need, when you need it.  Your next appointment:   2-3 month(s)  Provider:   Arun K Thukkani, MD     Other Instructions    Please report to Radiology at the [facility] Main Entrance 30 minutes early for your test.  [address] [place], [place] [address]                         OR   Please report to Radiology at [facility] Main Entrance, medical mall, 30 mins prior to your test.  [address]  [place], [place]  How to Prepare for Your Cardiac PET/CT Stress Test:  Nothing to eat or drink, except water, 3 hours prior to arrival time.  NO caffeine/decaffeinated products, or chocolate 12 hours prior to arrival. (Please note decaffeinated beverages (teas/coffees) still contain caffeine).  If you have  caffeine within 12 hours prior, the test will need to be rescheduled.  Medication instructions: Do not take erectile dysfunction medications for 72 hours prior to test (sildenafil, tadalafil) Do not take nitrates (isosorbide  mononitrate, Ranexa) the day before or day of test Do not take tamsulosin the day before or morning of test Hold theophylline containing medications for 12 hours. Hold Dipyridamole 48 hours prior to the test.  Diabetic Preparation: If able to eat breakfast prior to 3 hour fasting, you may take all medications, including your insulin. Do not worry if you miss your breakfast dose of insulin - start at your next meal. If you do not eat prior to 3 hour fast-Hold all diabetes (oral and insulin) medications. Patients who wear a continuous glucose monitor MUST remove the device prior to scanning.  You may take your remaining medications with water.  NO perfume, cologne or lotion on chest or abdomen area. FEMALES - Please avoid wearing dresses to this appointment.  Total time is 1 to 2 hours; you may want to bring reading material for the waiting time.  IF YOU THINK YOU MAY BE PREGNANT, OR ARE NURSING PLEASE INFORM THE TECHNOLOGIST.  In preparation for your appointment, medication and supplies will be purchased.  Appointment availability is limited, so if you need to cancel or reschedule, please call the Radiology Department Scheduler at [phone] 24 hours in advance to avoid a cancellation fee of $100.00  What to Expect When you Arrive:  Once you arrive and check in for your appointment, you will be taken to a preparation room within the Radiology Department.  A technologist or Nurse will obtain your medical history, verify that you are correctly prepped for the exam, and explain the procedure.  Afterwards, an IV will be started in your arm and electrodes will be placed on your skin for EKG monitoring during the stress portion of the exam. Then you will be escorted to the  PET/CT scanner.  There, staff will get you positioned on the scanner and obtain a blood pressure and EKG.  During the exam, you will continue to be connected to the EKG and blood pressure machines.  A small, safe amount of a radioactive tracer will be injected in your IV to obtain a series of pictures of your heart along with an injection of a stress agent.    After your Exam:  It is recommended that you eat a meal and drink a caffeinated beverage to counter act any effects of the stress agent.  Drink plenty of fluids for the remainder of the day and urinate frequently for the first couple of hours after the exam.  Your doctor will inform you of your test results within 7-10 business days.  For more information and frequently asked questions, please visit our website: [email or web address]  For questions about your test or how to prepare for your test, please call: Cardiac Imaging Nurse Navigators Office: [phone]

## 2024-07-24 NOTE — Progress Notes (Signed)
 " Cardiology Office Note:  .    Date:  07/24/2024  ID:  Deborah Andrews, DOB 1986-02-28, MRN [id number] PCP: Deborah Lavern CROME, MD  [facility] Providers Cardiologist:  Deborah MARLA Red, MD     CC: DOD- CP  History of Present Illness: .    Deborah Andrews is a 39 y.o. female with chest pain syndrome and palpitations who presents with chest discomfort. She was referred by Dr. Derril for evaluation of her chest discomfort.  She experiences chest discomfort, describing it as currently manageable but had severe pain the previous day, likening it to childbirth. Aspirin and ibuprofen  provided relief. Her history includes chest pain syndrome and palpitations. Previous cardiac evaluations, including an echocardiogram, heart monitor, and cardiac CT scan, showed normal results.  She was treated empirically for microvascular disease but is currently off medication due to personal circumstances, including a move to [place]  and back, and seeks a refill. She was previously prescribed Imdur  30 mg daily and Zepbound , which she did not take due to cost.  Her family history is significant for heart disease, with both parents having had heart attacks and her father undergoing open heart surgery. Her grandfather had twelve open heart surgeries. She reports a personal history of her heart stopping intermittently since birth.  A sleep study is pending to evaluate for obstructive sleep apnea, which she denies having. She has successfully reduced her weight from a high of 364 pounds, does not smoke, and has a good A1c level, indicating she is not diabetic.  Discussed the use of AI scribe software for clinical note transcription with the patient, who gave verbal consent to proceed.   Relevant histories: .  Social  - AT patient, moved to [place] then moved back ROS: As per HPI.   Studies Reviewed: .     Cardiac Studies & Procedures    ______________________________________________________________________________________________     ECHOCARDIOGRAM  ECHOCARDIOGRAM COMPLETE 08/03/2021  Narrative ECHOCARDIOGRAM REPORT    Patient Name:   Deborah Andrews Date of Exam: 08/03/2021 Medical Rec #:  [id number]    Height:       64.0 in Accession #:    [id number]   Weight:       300.0 lb Date of Birth:  October 09, 1985     BSA:          2.324 m Patient Age:    35 years     BP:           118/70 mmHg Patient Gender: F            HR:           78 bpm. Exam Location:  Outpatient  Procedure: 2D Echo and 3D Echo  Indications:    Syncope R55  History:        Patient has no prior history of Echocardiogram examinations. Signs/Symptoms:Murmur and palpitations.  Sonographer:    Augustin Seals RDCS Referring Phys: [id number] Deborah Andrews  IMPRESSIONS   1. Left ventricular ejection fraction by 3D volume is 57 %. The left ventricle has normal function. The left ventricle has no regional wall motion abnormalities. Left ventricular diastolic parameters were normal. 2. Right ventricular systolic function is normal. The right ventricular size is normal. 3. The mitral valve is normal in structure. Trivial mitral valve regurgitation. No evidence of mitral stenosis. 4. The aortic valve is normal in structure. Aortic valve regurgitation is not visualized. No aortic stenosis is present. 5. The inferior vena cava is normal in  size with greater than 50% respiratory variability, suggesting right atrial pressure of 3 mmHg.  Conclusion(s)/Recommendation(s): Normal biventricular function without evidence of hemodynamically significant valvular heart disease.  FINDINGS Left Ventricle: Left ventricular ejection fraction by 3D volume is 57 %. The left ventricle has normal function. The left ventricle has no regional wall motion abnormalities. The left ventricular internal cavity size was normal in size. There is no left ventricular hypertrophy. Left  ventricular diastolic parameters were normal.  Right Ventricle: The right ventricular size is normal. No increase in right ventricular wall thickness. Right ventricular systolic function is normal.  Left Atrium: Left atrial size was normal in size.  Right Atrium: Right atrial size was normal in size.  Pericardium: There is no evidence of pericardial effusion.  Mitral Valve: The mitral valve is normal in structure. Trivial mitral valve regurgitation. No evidence of mitral valve stenosis.  Tricuspid Valve: The tricuspid valve is normal in structure. Tricuspid valve regurgitation is not demonstrated. No evidence of tricuspid stenosis.  Aortic Valve: The aortic valve is normal in structure. Aortic valve regurgitation is not visualized. No aortic stenosis is present.  Pulmonic Valve: The pulmonic valve was normal in structure. Pulmonic valve regurgitation is not visualized. No evidence of pulmonic stenosis.  Aorta: The aortic root is normal in size and structure.  Venous: The inferior vena cava is normal in size with greater than 50% respiratory variability, suggesting right atrial pressure of 3 mmHg.  IAS/Shunts: No atrial level shunt detected by color flow Doppler.   LEFT VENTRICLE PLAX 2D LVIDd:         4.80 cm         Diastology LVIDs:         3.10 cm         LV e' medial:    10.80 cm/s LV PW:         0.95 cm         LV E/e' medial:  8.0 LV IVS:        0.90 cm         LV e' lateral:   14.90 cm/s LVOT diam:     2.25 cm         LV E/e' lateral: 5.8 LV SV:         79 LV SV Index:   34 LVOT Area:     3.98 cm        3D Volume EF LV 3D EF:    Left ventricul LV Volumes (MOD)                            ar LV vol d, MOD    105.0 ml                   ejection A2C:                                        fraction LV vol d, MOD    91.1 ml                    by 3D A4C:                                        volume is LV vol s,  MOD    43.9 ml                    57 %. A2C: LV vol s, MOD     37.0 ml A4C:                           3D Volume EF: LV SV MOD A2C:   61.1 ml       3D EF:        57 % LV SV MOD A4C:   91.1 ml       LV EDV:       196 ml LV SV MOD BP:    58.5 ml       LV ESV:       84 ml LV SV:        112 ml  RIGHT VENTRICLE RV S prime:     12.40 cm/s TAPSE (M-mode): 1.8 cm  LEFT ATRIUM             Index        RIGHT ATRIUM           Index LA diam:        3.30 cm 1.42 cm/m   RA Area:     17.20 cm LA Vol (A2C):   72.9 ml 31.36 ml/m  RA Volume:   48.50 ml  20.87 ml/m LA Vol (A4C):   27.7 ml 11.92 ml/m LA Biplane Vol: 47.5 ml 20.44 ml/m AORTIC VALVE LVOT Vmax:   100.00 cm/s LVOT Vmean:  64.500 cm/s LVOT VTI:    0.199 m  AORTA Ao Root diam: 2.65 cm  MITRAL VALVE MV Area (PHT): 3.83 cm    SHUNTS MV Decel Time: 198 msec    Systemic VTI:  0.20 m MV E velocity: 86.50 cm/s  Systemic Diam: 2.25 cm MV A velocity: 68.70 cm/s MV E/A ratio:  1.26  Oneil Parchment MD Electronically signed by Oneil Parchment MD Signature Date/Time: 08/03/2021/3:45:37 PM    Final    MONITORS  LONG TERM MONITOR (3-14 DAYS) 08/13/2022  Narrative Patch Wear Time:  4 days and 19 hours (2024-01-10T16:38:44-0500 to 2024-01-15T11:58:14-0500)  Patient had a min HR of 54 bpm, max HR of 157 bpm, and avg HR of 89 bpm. Predominant underlying rhythm was Sinus Rhythm.  EVENTS:  Isolated SVEs were occasional (1.4%, 5829), SVE Couplets were rare (<1.0%, 20), and SVE Triplets were rare (<1.0%, 2).  Isolated VEs were rare (<1.0%), and no VE Couplets or VE Triplets were present.  No atrial fibrillation, ventricular tachyarrhythmias, or bradyarrhythmias were detected.  Patient triggered events corresponded with sinus rhythm and PVCs.   CT SCANS  CT CORONARY MORPH W/CTA COR W/SCORE 10/27/2021  Addendum 10/27/2021  4:10 PM ADDENDUM REPORT: 10/27/2021 16:07  EXAM: OVER-READ INTERPRETATION  CT CHEST  The following report is an over-read performed by radiologist Dr. Ryan Homme  [facility] on 10/27/2021. This over-read does not include interpretation of cardiac or coronary anatomy or pathology. The coronary CTA interpretation by the cardiologist is attached.  COMPARISON:  Chest radiograph 05/07/2020  FINDINGS: Calcified subcarinal lymph nodes compatible with old granulomatous disease. Densely calcified 9 by 7 mm left lower lobe nodule compatible with benign granuloma.  Mild bilateral airway thickening is observed. Mild lower thoracic spondylosis. Suspected mild atelectasis in the right middle lobe medially with localized subpleural density in this vicinity measuring up to 4 mm in thickness on image 26 series  13.  IMPRESSION: 1. Old granulomatous disease. 2. Mild atelectasis medially in the right middle lobe. 3. Airway thickening is present, suggesting bronchitis or reactive airways disease.   Electronically Signed By: Ryan Salvage M.D. On: 10/27/2021 16:07  Narrative CLINICAL DATA:  Chest pain  EXAM: Cardiac/Coronary CTA  TECHNIQUE: A non-contrast, gated CT scan was obtained with axial slices of 3 mm through the heart for calcium scoring. Calcium scoring was performed using the Agatston method. A 130 kV prospective, gated, contrast cardiac scan was obtained. Gantry rotation speed was 250 msecs and collimation was 0.6 mm. 10 mg IV diltiazem  and 5 mg IV metoprolol  was given. Two sublingual nitroglycerin  tablets (0.8 mg) were given. The 3D data set was reconstructed in 5% intervals of the 35-75% of the R-R cycle. Diastolic phases were analyzed on a dedicated workstation using MPR, MIP, and VRT modes. The patient received 95 cc of contrast.  FINDINGS: Image quality: Excellent.  Noise artifact is: Limited.  Coronary Arteries:  Normal coronary origin.  Right dominance.  Left main: The left main is a large caliber vessel with a normal take off from the left coronary cusp that bifurcates to form a left anterior descending  artery and a left circumflex artery. There is no plaque or stenosis.  Left anterior descending artery: The LAD is patent without evidence of plaque or stenosis. The LAD gives off 2 patent diagonal branches.  Left circumflex artery: The LCX is non-dominant and patent with no evidence of plaque or stenosis. The LCX gives off 1 patent obtuse marginal branch.  Right coronary artery: The RCA is dominant with normal take off from the right coronary cusp. There is no evidence of plaque or stenosis. The RCA terminates as a PDA and right posterolateral branch without evidence of plaque or stenosis.  Right Atrium: Right atrial size is within normal limits.  Right Ventricle: The right ventricular cavity is within normal limits.  Left Atrium: Left atrial size is normal in size with no left atrial appendage filling defect.  Left Ventricle: The ventricular cavity size is within normal limits.  Pulmonary arteries: Normal in size without proximal filling defect.  Pulmonary veins: Normal pulmonary venous drainage.  Pericardium: Normal thickness without significant effusion or calcium present.  Aorta: Normal caliber without significant disease.  Extra-cardiac findings: See attached radiology report for non-cardiac structures.  IMPRESSION: 1. Coronary calcium score of 0.  2. Normal coronary origin with right dominance.  3. Normal coronary arteries.  RECOMMENDATIONS: 1. No evidence of CAD (0%). Consider non-atherosclerotic causes of chest pain.  Darryle Decent, MD  Electronically Signed: By: Darryle Decent M.D. On: 10/27/2021 14:54     ______________________________________________________________________________________________       Physical Exam:    VS:  BP 104/66   Pulse 76   Ht 5' 3.5 (1.613 m)   Wt 295 lb 3.2 oz (133.9 kg)   LMP 12/14/2021   SpO2 95%   BMI 51.47 kg/m    Wt Readings from Last 3 Encounters:  07/24/24 295 lb 3.2 oz (133.9 kg)  07/23/24 296 lb  15.4 oz (134.7 kg)  05/19/24 297 lb (134.7 kg)    Gen: no distress, morbid obesity   Neck: No JVD Cardiac: No Rubs or Gallops, no Murmur, RRR +2  radial pulses distant heart sounds Respiratory: Clear to auscultation bilaterally, normal effort, normal  respiratory rate GI: Soft, nontender, non-distended  MS: No  edema;  moves all extremities Integument: Skin feels warm Neuro:  At time of evaluation, alert and  oriented to person/place/time/situation  Psych: Tearful affect, patient feels poorly     ASSESSMENT AND PLAN: .    Coronary microvascular angina Intermittent chest pain, not classic for cardiac origin. Previous evaluations including echocardiogram, heart monitor, and cardiac CT showed no obstructive lesions or arrhythmias. Empirical treatment for microvascular disease was initiated. Current chest pain is manageable but was severe previously. EKG showed sinus rhythm with slight changes. Discussed PET myocardial perfusion test for definitive diagnosis of coronary microvascular disease. No specific treatment for coronary microvascular disease, but current medication is beneficial. Smoking cessation and weight management are beneficial. - Refilled Imdur  30 mg PO daily - Ordered PET myocardial perfusion test - Will follow up with Doctor Takani's team in March or April - remain smoke free  Morbid obesity Previous highest weight of 364 lbs. Current weight management efforts are ongoing. Discussed potential use of Zepbound  for weight management, contingent on insurance coverage and pending sleep study results. Insurance coverage for Zepbound  may be possible if obstructive sleep apnea with an apnea hypopnea index of greater than 15 is diagnosed. - Continue weight management efforts - Await results of pending sleep study for potential Zepbound  coverage  Sleep apnea (pending evaluation) Pending sleep study scheduled for January 6th. Sleep study results may influence insurance coverage for  Zepbound . - Await results of pending sleep study  F/u with Dr. Parry team in three months  Stanly Leavens, MD FASE [facility] Cardiologist [facility]  [address] [place], [address] [place], [place] [address] [phone]  12:59 PM  "

## 2024-07-24 NOTE — Telephone Encounter (Signed)
 Spoke with pt regarding a letter. A work excuse letter was released to [employer]. Pt aware. Pt verbalized understanding. All questions if any were answered.

## 2024-07-28 ENCOUNTER — Ambulatory Visit (HOSPITAL_BASED_OUTPATIENT_CLINIC_OR_DEPARTMENT_OTHER): Admitting: Pulmonary Disease

## 2024-08-11 ENCOUNTER — Other Ambulatory Visit: Payer: Self-pay | Admitting: Podiatry

## 2024-08-17 NOTE — Progress Notes (Unsigned)
" °  Cardiology Office Note   Date:  08/17/2024  ID:  Deborah Andrews, DOB 11/17/1985, MRN [id number] PCP: Jodie Lavern CROME, MD  [facility] Providers Cardiologist:  Lurena MARLA Red, MD   History of Present Illness Deborah Andrews is a 39 y.o. female with a past medical history of chest pain, palpitations. Presents today for a follow up appointment   Patient previously wore a cardiac monitor in 07/2021 that showed predominantly normal sinus rhythm with an average HR of 87 Bpm, rare PVCs and PACs. Echo in 07/2021 showed EF 57%, no regional wall motion abnormalities, normal LV diastolic parameters, normal RV systolic function, no significant valvular abnormalities   Coronary CTA 10/2021 showed coronary calcium score of 0, normal coronary arteries.   Cardiac monitor in 07/2022 showed occasional PACs (1.4%), rare PVCs.   Patient was seen by Dr. Santo on 07/24/24 as an acute visit for chest pain. She had been empirically treated for microvascular disease with imdur . Cardiac PET was ordered and showed ***   Chest pain  - Previous coronary CTA in 10/2021 showed a coronary calcium score of 0, normal coronary arteries  -  - Continue imdur  30 mg daily - on empirically for microvascular disease  -   Morbid obesity  - Highest weight was 364 lbs. She is working on losing weight and is down to 295 lbs ***  -   OSA  - Sleep study ? ***    ROS: ***  Studies Reviewed      *** Risk Assessment/Calculations {Does this patient have ATRIAL FIBRILLATION?[phone]} No BP recorded.  {Refresh Note OR Click here to enter BP  :1}***       Physical Exam VS:  LMP 12/14/2021        Wt Readings from Last 3 Encounters:  07/24/24 295 lb 3.2 oz (133.9 kg)  07/23/24 296 lb 15.4 oz (134.7 kg)  05/19/24 297 lb (134.7 kg)    GEN: Well nourished, well developed in no acute distress NECK: No JVD; No carotid bruits CARDIAC: ***RRR, no murmurs, rubs, gallops RESPIRATORY:  Clear to auscultation  without rales, wheezing or rhonchi  ABDOMEN: Soft, non-tender, non-distended EXTREMITIES:  No edema; No deformity   ASSESSMENT AND PLAN ***    {Are you ordering a CV Procedure (e.g. stress test, cath, DCCV, TEE, etc)?   Press F2        :[id number]}  Dispo: ***  Signed, Rollo FABIENE Louder, PA-C   "

## 2024-08-21 ENCOUNTER — Encounter (HOSPITAL_COMMUNITY): Payer: Self-pay

## 2024-08-24 ENCOUNTER — Telehealth (HOSPITAL_COMMUNITY): Payer: Self-pay | Admitting: Emergency Medicine

## 2024-08-24 NOTE — Telephone Encounter (Signed)
 Reaching out to patient to offer assistance regarding upcoming cardiac imaging study; pt verbalizes understanding of appt date/time, parking situation and where to check in, pre-test NPO status and medications ordered, and verified current allergies; name and call back number provided for further questions should they arise Rockwell Alexandria RN Navigator Cardiac Imaging [facility] [phone] office [phone] cell

## 2024-08-25 ENCOUNTER — Encounter (HOSPITAL_COMMUNITY)

## 2024-08-25 ENCOUNTER — Encounter (HOSPITAL_COMMUNITY): Admission: RE | Admit: 2024-08-25 | Source: Ambulatory Visit

## 2024-08-25 DIAGNOSIS — R072 Precordial pain: Secondary | ICD-10-CM

## 2024-08-25 LAB — NM PET CT CARDIAC PERFUSION MULTI W/ABSOLUTE BLOODFLOW
LV dias vol: 112 mL (ref 46–106)
MBFR: 4.31
Nuc Rest EF: 59 %
Nuc Stress EF: 66 %
Peak HR: 114 {beats}/min
Rest HR: 67 {beats}/min
Rest MBF: 0.64 ml/g/min
Rest Nuclear Isotope Dose: 30.1 mCi
ST Depression (mm): 0 mm
Stress MBF: 2.76 ml/g/min
Stress Nuclear Isotope Dose: 29.9 mCi
TID: 0.72

## 2024-08-25 MED ORDER — REGADENOSON 0.4 MG/5ML IV SOLN
0.4000 mg | Freq: Once | INTRAVENOUS | Status: AC
  Administered 2024-08-25: 0.4 mg via INTRAVENOUS

## 2024-08-25 MED ORDER — RUBIDIUM RB82 GENERATOR (RUBYFILL)
29.9600 | PACK | Freq: Once | INTRAVENOUS | Status: AC
  Administered 2024-08-25: 29.96 via INTRAVENOUS

## 2024-08-25 MED ORDER — RUBIDIUM RB82 GENERATOR (RUBYFILL)
30.0700 | PACK | Freq: Once | INTRAVENOUS | Status: AC
  Administered 2024-08-25: 30.07 via INTRAVENOUS

## 2024-08-25 MED ORDER — REGADENOSON 0.4 MG/5ML IV SOLN
INTRAVENOUS | Status: AC
  Filled 2024-08-25: qty 5

## 2024-08-26 ENCOUNTER — Ambulatory Visit: Payer: Self-pay

## 2024-08-26 ENCOUNTER — Ambulatory Visit: Payer: Self-pay | Admitting: Internal Medicine

## 2024-08-26 NOTE — Telephone Encounter (Signed)
 Pt reports mild body aches and fatigue x today. Her son who has been sick tested negative for flu and Covid today. She reports her son is being seen in office tomorrow at noon and requested she be seen as well because mama always catches it. Home care advice given to the pt. Appt for tomorrow scheduled.   FYI Only or Action Required?: FYI only for provider: appointment scheduled on 2/5.  Patient was last seen in primary care on 05/19/2024 by Jodie Lavern CROME, MD.  Called Nurse Triage reporting Fatigue.  Symptoms began today.  Interventions attempted: Nothing.  Symptoms are: stable.  Triage Disposition: Home Care  Patient/caregiver understands and will follow disposition?: No, wishes to speak with PCP     Reason for Triage: weakness and dizziness. States she feels like she will faint   Reason for Disposition  Mild weakness or fatigue with acute minor illness (e.g., colds)  Answer Assessment - Initial Assessment Questions 1. DESCRIPTION: Describe how you are feeling.     Pt reports the heat inside [employer] made her dizzy, has since resolved since leaving [employer]. Also reports fatigue and body aches  3. ONSET: When did these symptoms begin? (e.g., hours, days, weeks, months)     One instance at [facility] today  4. CAUSE: What do you think is causing the weakness or fatigue? (e.g., not drinking enough fluids, medical problem, trouble sleeping)     Son is sick, she suspects she has what he has. He tested negative for flu and Covid, has not tested herself yet  Protocols used: Weakness (Generalized) and Fatigue-A-AH

## 2024-08-26 NOTE — Telephone Encounter (Signed)
 Appt tomorrow

## 2024-08-27 ENCOUNTER — Ambulatory Visit: Admitting: Family Medicine

## 2024-08-27 ENCOUNTER — Encounter: Payer: Self-pay | Admitting: Family Medicine

## 2024-08-27 VITALS — BP 108/62 | HR 86 | Temp 98.2°F | Ht 63.5 in | Wt 290.2 lb

## 2024-08-27 DIAGNOSIS — R6889 Other general symptoms and signs: Secondary | ICD-10-CM

## 2024-08-27 DIAGNOSIS — R051 Acute cough: Secondary | ICD-10-CM

## 2024-08-27 DIAGNOSIS — R5383 Other fatigue: Secondary | ICD-10-CM

## 2024-08-27 DIAGNOSIS — J4520 Mild intermittent asthma, uncomplicated: Secondary | ICD-10-CM

## 2024-08-27 LAB — POC COVID19 BINAXNOW: SARS Coronavirus 2 Ag: NEGATIVE

## 2024-08-27 LAB — POCT INFLUENZA A/B
Influenza A, POC: NEGATIVE
Influenza B, POC: NEGATIVE

## 2024-08-27 MED ORDER — AZITHROMYCIN 250 MG PO TABS: ORAL_TABLET | ORAL | 0 refills | Status: AC

## 2024-08-27 MED ORDER — PREDNISONE 10 MG PO TABS: ORAL_TABLET | ORAL | 0 refills | Status: AC

## 2024-08-27 NOTE — Progress Notes (Signed)
 "  Subjective  CC:  Chief Complaint  Patient presents with   Fatigue    Cough along with fatigue and some congestion since Tuesday    HPI: Deborah Andrews is a 39 y.o. female who presents to the office today to address the problems listed above in the chief complaint. Discussed the use of AI scribe software for clinical note transcription with the patient, who gave verbal consent to proceed.  History of Present Illness Deborah Andrews is a 39 year old female with asthma who presents with flu-like symptoms and concerns about a viral infection.  Upper respiratory symptoms - Flu-like symptoms for 2 days - Body aches present - Post-nasal drainage - Greenish phlegm with cough - No fever - No sore throat - son with same illness  Asthma - History of asthma - Uses inhaler as needed - No inhaler use required during current illness - Sufficient inhaler supply at home - no wheezing or chest tightness. - productive cough  Weight management - Recent unintentional weight loss of a few pounds, down 20 pounds overall! - Interest in medication options for weight management, particularly regarding insurance coverage Failed tirzepatide  due to gi side effects    Assessment  1. Flu-like symptoms   2. Acute cough   3. Other fatigue   4. Mild intermittent asthma without complication   5. Morbid obesity due to excess calories [facility])      Plan  Assessment and Plan Assessment & Plan Viral bronchitis/flu like illness with acute cough and fatigue Symptoms include fatigue, sore throat, and greenish drainage, suggestive of viral bronchitis. COVID and flu tests are negative. Likely viral etiology, similar to her child's symptoms. No fever reported. No need for antibiotics at this time. - Prescribed prednisone  taper for potential asthma exacerbation.pt to monitor for sxs and use only if needed - Prescribed azithromycin  if asthma exacerbation or bronchitis symptoms worsen. - high risk pt due to  asthma  Mild intermittent asthma Asthma is mild and intermittent. No recent exacerbations requiring inhaler use. Current symptoms do not indicate a need for increased asthma management. - Prescribed prednisone  taper for potential asthma exacerbation. - Prescribed azithromycin  if asthma exacerbation or bronchiolitis symptoms worsen.  Morbid obesity due to excess calories Morbid obesity is managed with weight loss efforts. She has lost a few pounds recently. Insurance coverage for GLP-1 agonists like Zepbound  is uncertain without a diagnosis of sleep apnea or diabetes. - Continue weight loss efforts.    Follow up: April or may for cpe Orders Placed This Encounter  Procedures   POC COVID-19   POCT Influenza A/B   Meds ordered this encounter  Medications   predniSONE  (DELTASONE ) 10 MG tablet    Sig: Take 4 tabs qd x 2 days, 3 qd x 2 days, 2 qd x 2d, 1qd x 3 days    Dispense:  21 tablet    Refill:  0   azithromycin  (ZITHROMAX ) 250 MG tablet    Sig: Take 2 tabs today, then 1 tab daily for 4 days    Dispense:  1 each    Refill:  0     I reviewed the patients updated PMH, FH, and SocHx.  Patient Active Problem List   Diagnosis Date Noted   Precordial chest pain 11/08/2022    Priority: High   Snoring 03/30/2022    Priority: High   At high risk for breast cancer 08/04/2020    Priority: High   Former smoker 06/07/2020    Priority:  High   Morbid obesity due to excess calories (HCC) 06/07/2020    Priority: High   Depression, major, recurrent, in complete remission 06/07/2020    Priority: High   GERD (gastroesophageal reflux disease) 02/11/2023    Priority: Medium    Leukocytosis 11/08/2022    Priority: Medium    Dependent edema 04/20/2022    Priority: Medium    Syncope 2022 08/03/2021    Priority: Medium    Mild intermittent asthma without complication 06/07/2020    Priority: Medium    Left lower lobe pulmonary nodule 11/08/2022    Priority: Low   S/P vaginal hysterectomy  01/02/2022    Priority: Low   Herpes simplex 12/05/2020    Priority: Low   Genetic testing 07/27/2020    Priority: Low   Active Medications[1] Allergies: Patient is allergic to mounjaro [tirzepatide ]. Family History: Patient family history includes Breast cancer in her cousin, cousin, cousin, maternal aunt, maternal aunt, maternal aunt, and maternal aunt; Breast cancer (age of onset: 28) in her mother; Breast cancer (age of onset: 75) in her maternal uncle; Cancer in her maternal grandfather; Cancer (age of onset: 55) in her mother; Cancer - Other (age of onset: 30) in her mother; Cervical cancer (age of onset: 25) in her half-sister; Diabetes in her father and mother; Hypertension in her mother; Leukemia (age of onset: 2) in her half-brother; Melanoma in her maternal grandfather and maternal grandmother; Skin cancer in her maternal aunt. Social History:  Patient  reports that she quit smoking about 22 months ago. Her smoking use included cigarettes. She has a 88 pack-year smoking history. She has never used smokeless tobacco. She reports current alcohol use. She reports that she does not use drugs.  Review of Systems: Constitutional: Negative for fever malaise or anorexia Cardiovascular: negative for chest pain Respiratory: negative for SOB or persistent cough Gastrointestinal: negative for abdominal pain  Objective  Vitals: BP 108/62   Pulse 86   Temp 98.2 F (36.8 C)   Ht 5' 3.5 (1.613 m)   Wt 290 lb 3.2 oz (131.6 kg)   LMP 12/14/2021   SpO2 98%   BMI 50.60 kg/m  General: no acute distress , A&Ox3, appears well HEENT: PEERL, conjunctiva normal, neck is supple Cardiovascular:  RRR without murmur or gallop.  Respiratory:  Good breath sounds bilaterally, CTAB with normal respiratory effort Skin:  Warm, no rashes  Office Visit on 08/27/2024  Component Date Value Ref Range Status   SARS Coronavirus 2 Ag 08/27/2024 Negative  Negative Final   Influenza A, POC 08/27/2024 Negative   Negative Final   Influenza B, POC 08/27/2024 Negative  Negative Final    Commons side effects, risks, benefits, and alternatives for medications and treatment plan prescribed today were discussed, and the patient expressed understanding of the given instructions. Patient is instructed to call or message via MyChart if he/she has any questions or concerns regarding our treatment plan. No barriers to understanding were identified. We discussed Red Flag symptoms and signs in detail. Patient expressed understanding regarding what to do in case of urgent or emergency type symptoms.  Medication list was reconciled, printed and provided to the patient in AVS. Patient instructions and summary information was reviewed with the patient as documented in the AVS. This note was prepared with assistance of Dragon voice recognition software. Occasional wrong-word or sound-a-like substitutions may have occurred due to the inherent limitations of voice recognition software    [1]  Current Meds  Medication Sig   acetic  acid-hydrocortisone  (VOSOL -HC) OTIC solution Place 3 drops into the right ear 2 (two) times daily. Apply to affected ear. For ear canal itching, pain.   albuterol  (VENTOLIN  HFA) 108 (90 Base) MCG/ACT inhaler Inhale 2 puffs into the lungs every 4 (four) hours as needed for wheezing or shortness of breath.   ALPRAZolam  (XANAX ) 0.5 MG tablet Take 1 tablet (0.5 mg total) by mouth daily as needed (Panic attack).   azithromycin  (ZITHROMAX ) 250 MG tablet Take 2 tabs today, then 1 tab daily for 4 days   buPROPion  (WELLBUTRIN  XL) 300 MG 24 hr tablet Take 1 tablet (300 mg total) by mouth daily.   furosemide  (LASIX ) 20 MG tablet Take 1 tablet (20 mg total) by mouth daily as needed for edema. Take with potassium   isosorbide  mononitrate (IMDUR ) 30 MG 24 hr tablet Take 1 tablet (30 mg total) by mouth daily.   loratadine (CLARITIN) 10 MG tablet Take 10 mg by mouth daily. Pt takes over the counter Aller-itin for  allergies.   meloxicam  (MOBIC ) 15 MG tablet TAKE 1 TABLET(15 MG) BY MOUTH DAILY   Moringa Oleifera (MORINGA PO) Take 1,000 mg by mouth daily. Moringa an over the counter multi vitamin supplement.   Multiple Vitamin (MULTIVITAMIN) capsule Take 1 capsule by mouth daily.   omeprazole  (PRILOSEC) 20 MG capsule Take 1 capsule (20 mg total) by mouth daily.   predniSONE  (DELTASONE ) 10 MG tablet Take 4 tabs qd x 2 days, 3 qd x 2 days, 2 qd x 2d, 1qd x 3 days   traZODone  (DESYREL ) 50 MG tablet Take 1 tablet (50 mg total) by mouth at bedtime as needed for sleep.   UNABLE TO FIND Take 1 capsule by mouth daily. Med Name: URO   [DISCONTINUED] tirzepatide  (ZEPBOUND ) 2.5 MG/0.5ML Pen Inject 2.5 mg into the skin once a week. Please start prior authorization, failed [employer]  due to GI side effects including watery diarrhea   "

## 2024-08-27 NOTE — Patient Instructions (Addendum)
 Please return in April or May for your annual complete physical; please come fasting.   If you have any questions or concerns, please don't hesitate to send me a message via MyChart or call the office at [phone]. Thank you for visiting with us  today! It's our pleasure caring for you.    VISIT SUMMARY: During your visit, we discussed your flu-like symptoms, asthma management, and weight management. You have been experiencing symptoms such as body aches, post-nasal drainage, and a cough with greenish phlegm for the past two days. We also addressed your recent unintentional weight loss and concerns about a possible viral infection affecting both you and your son.  YOUR PLAN: -VIRAL BRONCHITIS WITH ACUTE COUGH AND FATIGUE: Viral bronchitis is an infection of the bronchial tubes, leading to symptoms like cough, fatigue, and drainage. Your COVID and flu tests were negative, indicating a viral cause. You do not need antibiotics at this time. We have prescribed a prednisone  taper to manage any potential asthma exacerbation and azithromycin  if your symptoms worsen.  -MILD INTERMITTENT ASTHMA: Asthma is a condition where your airways narrow and swell, producing extra mucus. Your asthma is mild and intermittent, and you have not needed your inhaler recently. We have prescribed a prednisone  taper for potential asthma exacerbation and azithromycin  if your symptoms worsen.  -MORBID OBESITY DUE TO EXCESS CALORIES: Morbid obesity is a condition of having an excessive amount of body fat. You have recently lost a few pounds. Continue your weight loss efforts. Insurance coverage for medications like GLP-1 agonists may require a diagnosis of sleep apnea or diabetes.  INSTRUCTIONS: Please follow the prescribed prednisone  taper and use azithromycin  only if your symptoms worsen. Continue your weight loss efforts and monitor your symptoms closely. If you experience any new or worsening symptoms, please schedule a  follow-up appointment.    Contains text generated by [employer].

## 2024-08-31 ENCOUNTER — Ambulatory Visit: Admitting: Cardiology
